# Patient Record
Sex: Female | Born: 1945
Health system: Southern US, Community
[De-identification: ages and names within clinical notes are randomized; demographics above are authoritative.]

## PROBLEM LIST (undated history)

## (undated) DIAGNOSIS — I10 Essential (primary) hypertension: Secondary | ICD-10-CM

## (undated) DIAGNOSIS — E785 Hyperlipidemia, unspecified: Secondary | ICD-10-CM

## (undated) DIAGNOSIS — I2510 Atherosclerotic heart disease of native coronary artery without angina pectoris: Secondary | ICD-10-CM

## (undated) DIAGNOSIS — Z720 Tobacco use: Secondary | ICD-10-CM

## (undated) DIAGNOSIS — C539 Malignant neoplasm of cervix uteri, unspecified: Secondary | ICD-10-CM

## (undated) HISTORY — DX: Atherosclerotic heart disease of native coronary artery without angina pectoris: I25.10

## (undated) HISTORY — PX: CORONARY ARTERY BYPASS GRAFT: SHX141

## (undated) HISTORY — DX: Tobacco use: Z72.0

## (undated) HISTORY — DX: Hyperlipidemia, unspecified: E78.5

## (undated) HISTORY — DX: Malignant neoplasm of cervix uteri, unspecified: C53.9

## (undated) HISTORY — DX: Essential (primary) hypertension: I10

---

## 1998-06-02 ENCOUNTER — Ambulatory Visit (HOSPITAL_COMMUNITY): Admission: RE | Admit: 1998-06-02 | Discharge: 1998-06-02 | Payer: Self-pay | Admitting: Obstetrics and Gynecology

## 1998-06-02 ENCOUNTER — Encounter: Payer: Self-pay | Admitting: Obstetrics and Gynecology

## 1999-09-30 ENCOUNTER — Encounter: Admission: RE | Admit: 1999-09-30 | Discharge: 1999-09-30 | Payer: Self-pay | Admitting: Obstetrics and Gynecology

## 1999-09-30 ENCOUNTER — Encounter: Payer: Self-pay | Admitting: Obstetrics and Gynecology

## 2000-08-01 ENCOUNTER — Encounter: Admission: RE | Admit: 2000-08-01 | Discharge: 2000-08-01 | Payer: Self-pay | Admitting: Family Medicine

## 2000-08-01 ENCOUNTER — Encounter: Payer: Self-pay | Admitting: Family Medicine

## 2000-08-24 ENCOUNTER — Other Ambulatory Visit: Admission: RE | Admit: 2000-08-24 | Discharge: 2000-08-24 | Payer: Self-pay | Admitting: Obstetrics and Gynecology

## 2000-10-24 ENCOUNTER — Encounter: Admission: RE | Admit: 2000-10-24 | Discharge: 2000-10-24 | Payer: Self-pay | Admitting: Obstetrics and Gynecology

## 2000-10-24 ENCOUNTER — Encounter: Payer: Self-pay | Admitting: Obstetrics and Gynecology

## 2001-04-17 ENCOUNTER — Other Ambulatory Visit: Admission: RE | Admit: 2001-04-17 | Discharge: 2001-04-17 | Payer: Self-pay | Admitting: Obstetrics and Gynecology

## 2001-12-27 ENCOUNTER — Encounter: Payer: Self-pay | Admitting: Obstetrics and Gynecology

## 2001-12-27 ENCOUNTER — Encounter: Admission: RE | Admit: 2001-12-27 | Discharge: 2001-12-27 | Payer: Self-pay | Admitting: Obstetrics and Gynecology

## 2003-07-17 ENCOUNTER — Encounter: Admission: RE | Admit: 2003-07-17 | Discharge: 2003-07-17 | Payer: Self-pay | Admitting: Obstetrics and Gynecology

## 2003-09-09 ENCOUNTER — Inpatient Hospital Stay (HOSPITAL_COMMUNITY): Admission: AD | Admit: 2003-09-09 | Discharge: 2003-09-15 | Payer: Self-pay | Admitting: Cardiovascular Disease

## 2003-09-29 ENCOUNTER — Ambulatory Visit (HOSPITAL_COMMUNITY)
Admission: RE | Admit: 2003-09-29 | Discharge: 2003-09-29 | Payer: Self-pay | Admitting: Thoracic Surgery (Cardiothoracic Vascular Surgery)

## 2004-01-15 ENCOUNTER — Ambulatory Visit: Payer: Self-pay

## 2004-02-17 ENCOUNTER — Ambulatory Visit: Payer: Self-pay | Admitting: Cardiology

## 2004-07-19 ENCOUNTER — Encounter: Admission: RE | Admit: 2004-07-19 | Discharge: 2004-07-19 | Payer: Self-pay | Admitting: Obstetrics and Gynecology

## 2004-08-16 ENCOUNTER — Ambulatory Visit: Payer: Self-pay | Admitting: Cardiology

## 2004-08-25 ENCOUNTER — Ambulatory Visit: Payer: Self-pay

## 2005-05-16 IMAGING — CR DG CHEST 1V PORT
1 series · 1 of 1 positions shown · non-contrast
Comparison: none

CLINICAL DATA: Status post CABG.
 PORTABLE SINGLE VIEW CHEST, 09/12/03, [DATE] HOURS
 Comparison 09/11/03.
 The patient has been extubated.  No pneumothorax.  Stable positioning of Swan-Ganz catheter.  No edema or pleural effusions.  Heart size stable. 
 IMPRESSION 
 No pneumothorax or other acute findings.

[view not recorded]
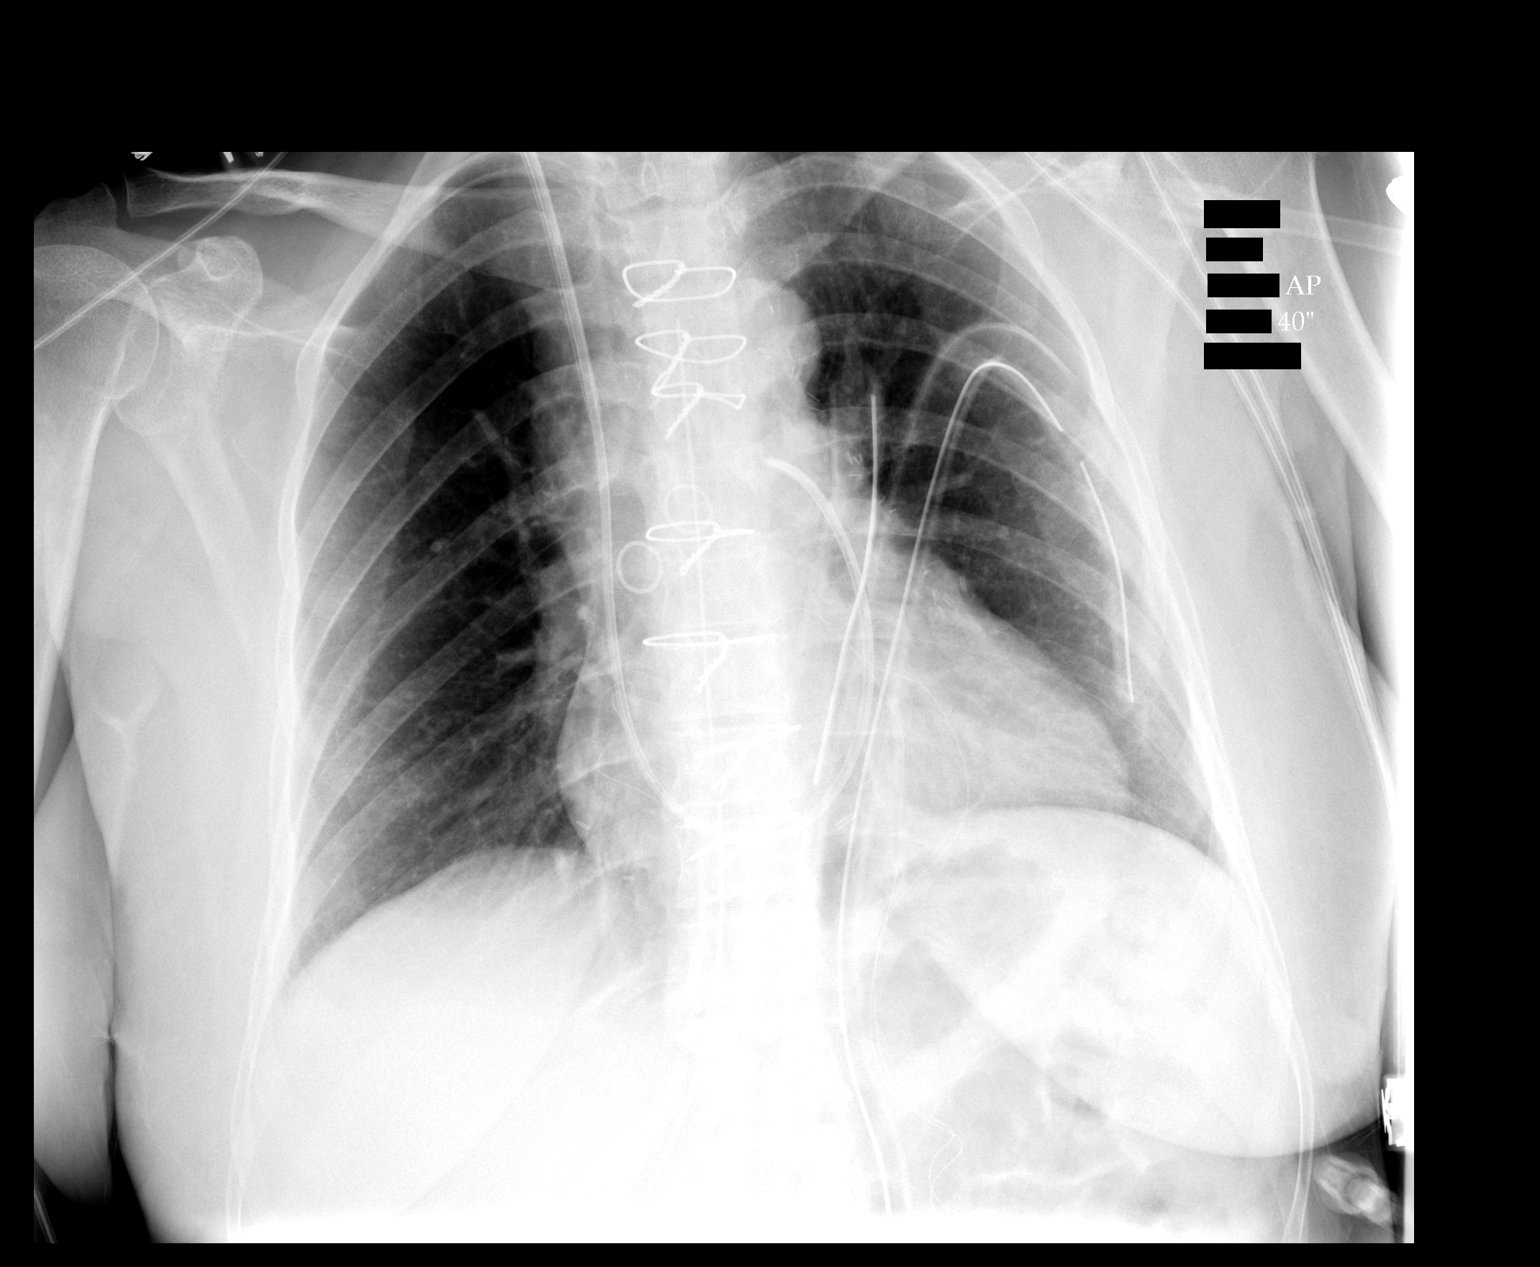

[1 of 1 positions shown; findings below may reference images not displayed]

## 2005-09-15 ENCOUNTER — Encounter: Admission: RE | Admit: 2005-09-15 | Discharge: 2005-09-15 | Payer: Self-pay | Admitting: Obstetrics and Gynecology

## 2005-11-24 ENCOUNTER — Ambulatory Visit: Payer: Self-pay | Admitting: Cardiology

## 2006-10-03 ENCOUNTER — Encounter: Admission: RE | Admit: 2006-10-03 | Discharge: 2006-10-03 | Payer: Self-pay | Admitting: Family Medicine

## 2006-11-15 ENCOUNTER — Ambulatory Visit: Payer: Self-pay | Admitting: Cardiology

## 2006-11-23 ENCOUNTER — Ambulatory Visit: Payer: Self-pay

## 2006-11-23 LAB — CONVERTED CEMR LAB
AST: 35 units/L (ref 0–37)
Alkaline Phosphatase: 84 units/L (ref 39–117)
Bilirubin, Direct: 0.2 mg/dL (ref 0.0–0.3)
CO2: 32 meq/L (ref 19–32)
Calcium: 9.5 mg/dL (ref 8.4–10.5)
Chloride: 101 meq/L (ref 96–112)
GFR calc non Af Amer: 78 mL/min
Glucose, Bld: 96 mg/dL (ref 70–99)
Potassium: 4.2 meq/L (ref 3.5–5.1)
Sodium: 139 meq/L (ref 135–145)
Total Bilirubin: 1.2 mg/dL (ref 0.3–1.2)
Total CHOL/HDL Ratio: 2.2
Total Protein: 6.6 g/dL (ref 6.0–8.3)
Triglycerides: 97 mg/dL (ref 0–149)

## 2007-10-04 ENCOUNTER — Encounter: Admission: RE | Admit: 2007-10-04 | Discharge: 2007-10-04 | Payer: Self-pay | Admitting: Obstetrics and Gynecology

## 2008-03-17 ENCOUNTER — Ambulatory Visit: Payer: Self-pay | Admitting: Internal Medicine

## 2008-03-17 DIAGNOSIS — Z8541 Personal history of malignant neoplasm of cervix uteri: Secondary | ICD-10-CM | POA: Insufficient documentation

## 2008-03-17 DIAGNOSIS — I2510 Atherosclerotic heart disease of native coronary artery without angina pectoris: Secondary | ICD-10-CM

## 2008-03-17 DIAGNOSIS — E785 Hyperlipidemia, unspecified: Secondary | ICD-10-CM | POA: Insufficient documentation

## 2008-04-28 ENCOUNTER — Telehealth: Payer: Self-pay | Admitting: Internal Medicine

## 2008-09-22 ENCOUNTER — Encounter: Payer: Self-pay | Admitting: Internal Medicine

## 2008-10-06 ENCOUNTER — Encounter: Payer: Self-pay | Admitting: Internal Medicine

## 2008-10-06 ENCOUNTER — Encounter: Admission: RE | Admit: 2008-10-06 | Discharge: 2008-10-06 | Payer: Self-pay | Admitting: Obstetrics and Gynecology

## 2009-02-13 ENCOUNTER — Ambulatory Visit: Payer: Self-pay | Admitting: Internal Medicine

## 2009-02-13 LAB — CONVERTED CEMR LAB
AST: 27 units/L (ref 0–37)
Alkaline Phosphatase: 68 units/L (ref 39–117)
BUN: 8 mg/dL (ref 6–23)
Basophils Relative: 1 % (ref 0.0–3.0)
Bilirubin, Direct: 0.2 mg/dL (ref 0.0–0.3)
CO2: 31 meq/L (ref 19–32)
GFR calc non Af Amer: 89.7 mL/min (ref >60)
Glucose, Bld: 85 mg/dL (ref 70–99)
HDL: 73.7 mg/dL (ref >39.00)
LDL Cholesterol: 61 mg/dL (ref 0–99)
Lymphocytes Relative: 18.3 % (ref 12.0–46.0)
Lymphs Abs: 1.3 10*3/uL (ref 0.7–4.0)
Neutrophils Relative %: 71 % (ref 43.0–77.0)
Platelets: 144 10*3/uL — ABNORMAL LOW (ref 150.0–400.0)
RDW: 11.8 % (ref 11.5–14.6)
Sodium: 143 meq/L (ref 135–145)
TSH: 1.66 microintl units/mL (ref 0.35–5.50)
Total CHOL/HDL Ratio: 2
Total CK: 90 units/L (ref 7–177)
VLDL: 10 mg/dL (ref 0.0–40.0)

## 2009-02-23 ENCOUNTER — Telehealth (INDEPENDENT_AMBULATORY_CARE_PROVIDER_SITE_OTHER): Payer: Self-pay | Admitting: *Deleted

## 2009-04-28 ENCOUNTER — Telehealth: Payer: Self-pay | Admitting: Internal Medicine

## 2009-05-05 ENCOUNTER — Encounter (INDEPENDENT_AMBULATORY_CARE_PROVIDER_SITE_OTHER): Payer: Self-pay | Admitting: *Deleted

## 2009-05-07 ENCOUNTER — Ambulatory Visit: Payer: Self-pay | Admitting: Internal Medicine

## 2009-05-21 ENCOUNTER — Ambulatory Visit: Payer: Self-pay | Admitting: Internal Medicine

## 2009-05-21 LAB — HM COLONOSCOPY

## 2009-09-30 LAB — HM PAP SMEAR: HM Pap smear: NORMAL

## 2009-11-19 ENCOUNTER — Encounter: Admission: RE | Admit: 2009-11-19 | Discharge: 2009-11-19 | Payer: Self-pay | Admitting: Obstetrics and Gynecology

## 2009-11-30 ENCOUNTER — Ambulatory Visit: Payer: Self-pay | Admitting: Internal Medicine

## 2009-11-30 DIAGNOSIS — L02519 Cutaneous abscess of unspecified hand: Secondary | ICD-10-CM | POA: Insufficient documentation

## 2009-11-30 DIAGNOSIS — L03019 Cellulitis of unspecified finger: Secondary | ICD-10-CM

## 2009-12-01 ENCOUNTER — Ambulatory Visit: Payer: Self-pay | Admitting: Internal Medicine

## 2010-04-04 ENCOUNTER — Encounter: Payer: Self-pay | Admitting: Thoracic Surgery (Cardiothoracic Vascular Surgery)

## 2010-04-04 ENCOUNTER — Encounter: Payer: Self-pay | Admitting: Obstetrics and Gynecology

## 2010-04-13 NOTE — Miscellaneous (Signed)
Summary: [facility] Previsit/prep  Clinical Lists Changes  Medications: Added new medication of MOVIPREP 100 GM  SOLR (PEG-KCL-NACL-NASULF-NA ASC-C) As per prep instructions. - Signed Rx of MOVIPREP 100 GM  SOLR (PEG-KCL-NACL-NASULF-NA ASC-C) As per prep instructions.;  #1 x 0;  Signed;  Entered by: Wyona Almas RN;  Authorized by: Hilarie Fredrickson MD;  Method used: Electronically to [facility]*, [address], [address] Shipping/[address], [place], [place]  [address], Ph: [phone], Fax: [phone] Observations: Added new observation of ALLERGY REV: Done (05/07/2009 15:20) Added new observation of NKA: T (05/07/2009 15:20)    Prescriptions: MOVIPREP 100 GM  SOLR (PEG-KCL-NACL-NASULF-NA ASC-C) As per prep instructions.  #1 x 0   Entered by:   Wyona Almas RN   Authorized by:   Hilarie Fredrickson MD   Signed by:   Wyona Almas RN on 05/07/2009   Method used:   Electronically to        [facility]* (retail)       [address]       [address] Shipping/mailing       [place], [place]  [address]       Ph: [phone]       Fax: [phone]   RxID:   [id number]

## 2010-04-13 NOTE — Progress Notes (Signed)
Summary: refill request  Phone Note Refill Request Message from:  Fax from Pharmacy on April 28, 2009 9:17 AM  Refills Requested: Medication #1:  VYTORIN 10-80 MG TABS Take 1 tablet by mouth once a day   Dosage confirmed as above?Dosage Confirmed   Supply Requested: 3 months   Last Refilled: 04/28/2008 Initial call taken by: Rock Nephew CMA,  April 28, 2009 9:18 AM    Prescriptions: VYTORIN 10-80 MG TABS (EZETIMIBE-SIMVASTATIN) Take 1 tablet by mouth once a day  #90 x 3   Entered by:   Rock Nephew CMA   Authorized by:   Etta Grandchild MD   Signed by:   Rock Nephew CMA on 04/28/2009   Method used:   Electronically to        [facility]* (retail)       [address]       [address] Shipping/mailing       [place], [place]  [address]       Ph: [phone]       Fax: [phone]   RxID:   [id number]

## 2010-04-13 NOTE — Procedures (Signed)
Summary: Colonoscopy  Patient: [name] Note: All result statuses are Final unless otherwise noted.  Tests: (1) Colonoscopy (COL)   COL Colonoscopy           DONE     [facility]     [address]     [place], [place]  [address]           COLONOSCOPY PROCEDURE REPORT           PATIENT:  Karen, Hughes  MR#:  [id number]     BIRTHDATE:  08-16-1945, 63 yrs. old  GENDER:  female           ENDOSCOPIST:  Wilhemina Bonito. Eda Keys, MD     Referred by:  Etta Grandchild, M.D.           PROCEDURE DATE:  05/21/2009     PROCEDURE:  Average-risk screening colonoscopy     G0121     ASA CLASS:  Class II     INDICATIONS:  Routine Risk Screening           MEDICATIONS:   Fentanyl 25 mcg IV, Versed 5 mg IV           DESCRIPTION OF PROCEDURE:   After the risks benefits and     alternatives of the procedure were thoroughly explained, informed     consent was obtained.  Digital rectal exam was performed and     revealed no abnormalities.   The LB CF-[id number] endoscope     was introduced through the anus and advanced to the cecum, which     was identified by both the appendix and ileocecal valve, without     limitations. Time to cecum = 3:31 min.The quality of the prep was     good, using MoviPrep.  The instrument was then slowly withdrawn     (time = 14:16 min) as the colon was fully examined.     <<PROCEDUREIMAGES>>           FINDINGS:  Mild diverticulosis was found in the sigmoid colon.  A     lipoma was found in the ascending colon.  Melanosis coli was found     throughout the colon.  No polyps or cancers were seen.     Retroflexed views in the rectum revealed no abnormalities.    The     scope was then withdrawn from the patient and the procedure     completed.           COMPLICATIONS:  None           ENDOSCOPIC IMPRESSION:     1) Mild diverticulosis in the sigmoid colon     2) Lipomas in the ascending colon     3) Melanosis throughout the colon     4) No polyps or  cancers     RECOMMENDATIONS:     1) Continue current colorectal screening recommendations for     "routine risk" patients with a repeat colonoscopy in 10 years.           ______________________________     Wilhemina Bonito. Eda Keys, MD           CC:  Etta Grandchild, MD;The Patient           n.     Rosalie DoctorWilhemina Bonito. Eda Keys at 05/21/2009 10:42 AM           Drusilla Kanner, [phone]  Note: An exclamation mark Marland Kitchen)  indicates a result that was not dispersed into the flowsheet. Document Creation Date: 05/21/2009 10:42 AM _______________________________________________________________________  (1) Order result status: Final Collection or observation date-time: 05/21/2009 10:33 Requested date-time:  Receipt date-time:  Reported date-time:  Referring Physician:   Ordering Physician: Fransico Setters [phone]) Specimen Source:  Source: Launa Grill Order Number: [id number] Lab site:   Appended Document: Colonoscopy    Clinical Lists Changes  Observations: Added new observation of COLONNXTDUE: 05/2019 (05/21/2009 11:11)

## 2010-04-13 NOTE — Letter (Signed)
Summary: [facility] Instructions  [facility]  [address] [place], [place] [address]   Phone: [phone]  Fax: [phone]       [name]    04/13/1963    MRN: [id number]        Procedure Day /Date:  Thursday  05/21/09     Arrival Time:  8:00am     Procedure Time:  9:00am    Location of Procedure:                    [facility] _  [facility] (4th Floor)                        PREPARATION FOR COLONOSCOPY WITH MOVIPREP   Starting 5 days prior to your procedure   Saturday 03/05  do not eat nuts, seeds, popcorn, corn, beans, peas,  salads, or any raw vegetables.  Do not take any fiber supplements (e.g. Metamucil, Citrucel, and Benefiber).  THE DAY BEFORE YOUR PROCEDURE         DATE:  03/09   DAY:  Wednesday  1.  Drink clear liquids the entire day-NO SOLID FOOD  2.  Do not drink anything colored red or purple.  Avoid juices with pulp.  No orange juice.  3.  Drink at least 64 oz. (8 glasses) of fluid/clear liquids during the day to prevent dehydration and help the prep work efficiently.  CLEAR LIQUIDS INCLUDE: Water Jello Ice Popsicles Tea (sugar ok, no milk/cream) Powdered fruit flavored drinks Coffee (sugar ok, no milk/cream) Gatorade Juice: apple, white grape, white cranberry  Lemonade Clear bullion, consomm, broth Carbonated beverages (any kind) Strained chicken noodle soup Hard Candy                             4.  In the morning, mix first dose of MoviPrep solution:    Empty 1 Pouch A and 1 Pouch B into the disposable container    Add lukewarm drinking water to the top line of the container. Mix to dissolve    Refrigerate (mixed solution should be used within 24 hrs)  5.  Begin drinking the prep at 5:00 p.m. The MoviPrep container is divided by 4 marks.   Every 15 minutes drink the solution down to the next mark (approximately 8 oz) until the full liter is complete.   6.  Follow completed prep with 16 oz of clear liquid of your  choice (Nothing red or purple).  Continue to drink clear liquids until bedtime.  7.  Before going to bed, mix second dose of MoviPrep solution:    Empty 1 Pouch A and 1 Pouch B into the disposable container    Add lukewarm drinking water to the top line of the container. Mix to dissolve    Refrigerate  THE DAY OF YOUR PROCEDURE      DATE:  03/10  DAY:  Thursday  Beginning at  4:00 a.m. (5 hours before procedure):         1. Every 15 minutes, drink the solution down to the next mark (approx 8 oz) until the full liter is complete.  2. Follow completed prep with 16 oz. of clear liquid of your choice.    3. You may drink clear liquids until 7:00am  (2 HOURS BEFORE PROCEDURE).   MEDICATION INSTRUCTIONS  Unless otherwise instructed, you should take regular prescription medications with a small  sip of water   as early as possible the morning of your procedure.         OTHER INSTRUCTIONS  You will need a responsible adult at least 65 years of age to accompany you and drive you home.   This person must remain in the waiting room during your procedure.  Wear loose fitting clothing that is easily removed.  Leave jewelry and other valuables at home.  However, you may wish to bring a book to read or  an iPod/MP3 player to listen to music as you wait for your procedure to start.  Remove all body piercing jewelry and leave at home.  Total time from sign-in until discharge is approximately 2-3 hours.  You should go home directly after your procedure and rest.  You can resume normal activities the  day after your procedure.  The day of your procedure you should not:   Drive   Make legal decisions   Operate machinery   Drink alcohol   Return to work  You will receive specific instructions about eating, activities and medications before you leave.    The above instructions have been reviewed and explained to me by  Wyona Almas RN  May 07, 2009 3:50 PM     I  fully understand and can verbalize these instructions _____________________________ Date _________

## 2010-04-13 NOTE — Assessment & Plan Note (Signed)
Summary: 1 DY PACKING REMOVAL APPT AND RECHECK--STC   Vital Signs:  Patient profile:   65 year old female Height:      66 inches Weight:      135 pounds O2 Sat:      97 % on Room air Temp:     97.8 degrees F oral Pulse rate:   57 / minute Pulse rhythm:   regular Resp:     16 per minute BP sitting:   100 / 56  (left arm) Cuff size:   regular  Vitals Entered By: Lamar Sprinkles, CMA (December 01, 2009 9:37 AM)  O2 Flow:  Room air CC: 1 day f/u   Primary Care Provider:  Etta Grandchild MD  CC:  1 day f/u.  History of Present Illness: She returns for f/up and says that her left ring finger is doing better with no pain or swelling. The specimen was positive for [facility] but no organisms seen or cultured thus far. She is tolerating the antibiotics well.  Preventive Screening-Counseling & Management  Alcohol-Tobacco     Alcohol drinks/day: 0     Smoking Status: quit     Year Quit: 1987     Pack years: 20     Passive Smoke Exposure: no     Tobacco Counseling: to remain off tobacco products  Current Medications (verified): 1)  Vytorin 10-80 Mg Tabs (Ezetimibe-Simvastatin) .... Take 1 Tablet By Mouth Once A Day 2)  Metoprolol Tartrate 25 Mg Tabs (Metoprolol Tartrate) .... 1/2 Two Times A Day 3)  Asa 325mg  .... Take 1 Tablet By Mouth Once A Day 4)  Sulfamethoxazole-Tmp Ds 800-160 Mg Tab (Trimethoprim-Sulfamethoxazole) .... Take 1 Tablet By Mouth Morning and Night 5)  Cefuroxime Axetil 500 Mg Tabs (Cefuroxime Axetil) .... One By Mouth Two Times A Day For 10 Days  Allergies (verified): No Known Drug Allergies  Past History:  Past Medical History: Last updated: 03/17/2008 Cervical cancer, hx of - Dr. 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 Family History of CAD Female 1st degree relative <50 Family History Diabetes 1st degree relative Family History Hypertension  Social History: Reviewed history from 03/17/2008 and no changes required. 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  Physical Exam  General:  alert, well-developed, well-nourished, and well-hydrated.   Neck:  supple, full ROM, no masses, no thyromegaly, no thyroid nodules or tenderness, no JVD, no cervical lymphadenopathy, and no neck tenderness.   Lungs:  normal respiratory effort, no intercostal retractions, no accessory muscle use, normal breath sounds, no dullness, and no fremitus.   Heart:  normal rate, regular rhythm, no murmur, no gallop, no rub, and no JVD.   Abdomen:  soft, non-tender, normal bowel sounds, no distention, no masses, no guarding, no rigidity, no rebound tenderness, no abdominal hernia, no hepatomegaly, and no splenomegaly.   Skin:  on the  dorsum of the left ring finger there is a 2 mm opening over the PIP joint and with no additional exudate noted and no erythema/ttp/swelling/warmth/induration/crepitance. distally there is good capillary refill and sensation. there is no involvement of the sides or the volar surface and no evidence of compartment syndrome. Cervical Nodes:  no anterior cervical adenopathy and no posterior cervical adenopathy.   Axillary  Nodes:  no R axillary adenopathy and no L axillary adenopathy.   Psych:  Cognition and judgment appear intact. Alert and cooperative with normal attention span and concentration. No apparent delusions, illusions, hallucinations Additional Exam:  the packing was removed and there was no additional exudate. neosporin and a bandaid were applied.   Impression & Recommendations:  Problem # 1:  CELLULITIS/ABSCESS, FINGER NOS (ICD-681.00) Assessment Improved  Her updated medication list for this problem includes:    Sulfamethoxazole-tmp Ds 800-160 Mg Tab (Trimethoprim-sulfamethoxazole) .Marland Kitchen... Take 1 tablet by mouth morning and night    Cefuroxime Axetil 500 Mg Tabs (Cefuroxime axetil) ..... One by mouth two times a day for 10 days  Elevate affected area. Warm moist compresses for 20 minutes every 2 hours while awake. Take antibiotics as directed and take acetaminophen as needed. To be seen in 48-72 hours if no improvement, sooner if worse.  Complete Medication List: 1)  Vytorin 10-80 Mg Tabs (Ezetimibe-simvastatin) .... Take 1 tablet by mouth once a day 2)  Metoprolol Tartrate 25 Mg Tabs (Metoprolol tartrate) .... 1/2 two times a day 3)  Asa 325mg   .... Take 1 tablet by mouth once a day 4)  Sulfamethoxazole-tmp Ds 800-160 Mg Tab (Trimethoprim-sulfamethoxazole) .... Take 1 tablet by mouth morning and night 5)  Cefuroxime Axetil 500 Mg Tabs (Cefuroxime axetil) .... One by mouth two times a day for 10 days  Patient Instructions: 1)  Please schedule a follow-up appointment in 1-2 weeks. 2)  Take your antibiotic as prescribed until ALL of it is gone, but stop if you develop a rash or swelling and contact our office as soon as possible.   Tetanus/Td Immunization History:    Tetanus/Td # 1:  Tdap (11/30/2009)

## 2010-04-13 NOTE — Assessment & Plan Note (Signed)
Summary: RED SWOLLEN L RING FINGER--STC   Vital Signs:  Patient profile:   65 year old female Height:      66 inches Weight:      134 pounds BMI:     21.71 O2 Sat:      96 % on Room air Temp:     97.8 degrees F oral Pulse rate:   67 / minute Pulse rhythm:   regular Resp:     16 per minute BP sitting:   122 / 70  (left arm) Cuff size:   large  Vitals Entered By: Bill Salinas CMA (November 30, 2009 9:11 AM)  O2 Flow:  Room air CC: pt here for evaluation of knot on left ring finger x 1 week/ ab   Primary Care Provider:  Etta Grandchild MD  CC:  pt here for evaluation of knot on left ring finger x 1 week/ ab.  History of Present Illness: She returns c/o a problem with her left ring finger. She thinks the area got scratched on the dorsum of the PIP joint and then became red and swollen so she tried to pop it with a needle but only a small amount of pus came out and the pain/redness/swelling have worsened.  Current Medications (verified): 1)  Vytorin 10-80 Mg Tabs (Ezetimibe-Simvastatin) .... Take 1 Tablet By Mouth Once A Day 2)  Metoprolol Tartrate 25 Mg Tabs (Metoprolol Tartrate) .... 1/2 Two Times A Day 3)  Asa 325mg  .... Take 1 Tablet By Mouth Once A Day  Allergies (verified): No Known Drug Allergies  Past History:  Past Medical History: Last updated: 03/17/2008 Cervical cancer, hx of - Dr. 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 Family History of CAD Female 1st degree relative <50 Family History Diabetes 1st degree relative Family History Hypertension  Social History: Reviewed history from 03/17/2008 and no changes required. 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    Physical Exam  General:  alert, well-developed, well-nourished, and well-hydrated.   Lungs:  normal respiratory effort, no intercostal retractions, no accessory muscle use, normal breath sounds, no dullness, and no fremitus.   Heart:  normal rate, regular rhythm, no murmur, no gallop, no rub, and no JVD.   Abdomen:  soft, non-tender, normal bowel sounds, no distention, no masses, no guarding, no rigidity, no rebound tenderness, no abdominal hernia, no hepatomegaly, and no splenomegaly.   Msk:  normal ROM, no joint tenderness, no joint swelling, no joint warmth, no redness over joints, no joint deformities, no joint instability, and no crepitation.   Skin:  on the dorsum of the left ring finger there is a tiny healed abrasion over the PIP joint and around this there is an area of erythema/warmth/swelling/fluctuance but no streaking. distally there is good capillary refill and sensation. there is no  involvement of the sides or the volar surface. Cervical Nodes:  no anterior cervical adenopathy and no posterior cervical adenopathy.   Axillary Nodes:  no R axillary adenopathy and no L axillary adenopathy.   Inguinal Nodes:  no R inguinal adenopathy and no L inguinal adenopathy.   Psych:  Cognition and judgment appear intact. Alert and cooperative with normal attention span and concentration. No apparent  delusions, illusions, hallucinations Additional Exam:  An I and D was preformed. the area was cleaned with betadine and prepped and draped in sterile fashion, local anesthesia was obtained with 1.5 cc of 2% plain lidocaine. a 3 mm punch was used to open the area and a small pocket of thick purulent exudate was seen and cultured, no deep space infection was seen, the opening was packed with iodoform, there was no blood loss and she tolerated it well. a dressing and finger splint were applied. after the procedure she still has good sensation and capillary refill at the tip of the finger.   Impression & Recommendations:  Problem # 1:  CELLULITIS/ABSCESS, FINGER NOS (ICD-681.00) Assessment New will cover MRSA and strep Her updated medication list for this problem includes:    Sulfamethoxazole-tmp Ds 800-160 Mg Tab (Trimethoprim-sulfamethoxazole) .Marland Kitchen... Take 1 tablet by mouth morning and night    Cefuroxime Axetil 500 Mg Tabs (Cefuroxime axetil) ..... One by mouth two times a day for 10 days  Orders: T-Culture, Wound ([id number])  Complete Medication List: 1)  Vytorin 10-80 Mg Tabs (Ezetimibe-simvastatin) .... Take 1 tablet by mouth once a day 2)  Metoprolol Tartrate 25 Mg Tabs (Metoprolol tartrate) .... 1/2 two times a day 3)  Asa 325mg   .... Take 1 tablet by mouth once a day 4)  Sulfamethoxazole-tmp Ds 800-160 Mg Tab (Trimethoprim-sulfamethoxazole) .... Take 1 tablet by mouth morning and night 5)  Cefuroxime Axetil 500 Mg Tabs (Cefuroxime axetil) .... One by mouth two times a day for 10 days  Patient Instructions: 1)  Please schedule a follow-up appointment in 1 day for packing removal and recheck. 2)  Take your antibiotic as prescribed until ALL of it is gone, but stop if you develop a rash or swelling and contact our office as soon as possible. Prescriptions: CEFUROXIME AXETIL 500 MG TABS (CEFUROXIME AXETIL) One by mouth two times a day for 10 days  #20 x 1   Entered and Authorized  by:   Etta Grandchild MD   Signed by:   Etta Grandchild MD on 11/30/2009   Method used:   Electronically to        [facility] [address] [phone]* (retail)       [address]       [place], [place]  [address]       Ph: [phone] or [phone]       Fax: [phone]   RxID:   [id number] SULFAMETHOXAZOLE-TMP DS 800-160 MG TAB (TRIMETHOPRIM-SULFAMETHOXAZOLE) Take 1 tablet by mouth morning and night  #20 x 1   Entered and Authorized by:   Etta Grandchild MD   Signed by:   Etta Grandchild MD on 11/30/2009   Method used:   [facility]        [facility] [address] [phone][facility] (retail)       [address]       [place], [place]  [address]       Ph: [phone] or  [phone]       Fax: [phone]   RxID:   [id number]   Appended Document: RED SWOLLEN L RING FINGER--STC     Clinical Lists Changes  Orders: Added new Service order of Tdap => 62yrs IM (41324) - Signed Added new Service order of Admin 1st Vaccine (40102) - Signed Observations: Added new observation of TD BOOST VIS: 01/30/08 version given December 01, 2009. (12/01/2009 9:38) Added new observation of TD BOOSTERLO: [id number] (12/01/2009 9:38) Added new observation of TD BOOST EXP: 01/01/2012 (12/01/2009 9:38) Added new observation of TD BOOSTERBY: Ami Bullins CMA (12/01/2009 9:38) Added new observation of TD BOOSTERRT: IM (12/01/2009 9:38) Added new observation of TDBOOSTERDSE: 0.5 ml (12/01/2009 9:38) Added new observation of TD BOOSTERMF: Gl[employer]oSmithK[employer] (12/01/2009 9:38) Added new observation of TD BOOST SIT: right deltoid (12/01/2009 9:38) Added new observation of TD BOOSTER: Tdap (12/01/2009 9:38)       Immunizations Administered:  Tetanus Vaccine:    Vaccine Type: Tdap    Site: right deltoid    Mfr: GlaxoSmithKline    Dose: 0.5 ml    Route: IM    Given by: Ami Bullins CMA    Exp. Date: 01/01/2012    Lot #: [id number]    VIS given: 01/30/08  version given December 01, 2009.

## 2010-05-19 ENCOUNTER — Telehealth: Payer: Self-pay | Admitting: Internal Medicine

## 2010-05-20 ENCOUNTER — Telehealth: Payer: Self-pay | Admitting: Internal Medicine

## 2010-05-25 NOTE — Progress Notes (Signed)
Summary: Med inquiry  Phone Note Call from Patient   Caller: Patient [phone] ([phone]) Summary of Call: Pt requesting clarification on dosage difference on cholesterol medication change..Pt states that she was taking Vytorin 80mg  and has been switched to Lipitor 40mg . Pt would like to know if new med dosage is sufficient.? Initial call taken by: Burnard Leigh [facility]),  May 20, 2010 10:50 AM  Follow-up for Phone Call        should be ok, we can change FLP in 1-2 months and see Follow-up by: Etta Grandchild MD,  May 20, 2010 10:56 AM  Additional Follow-up for Phone Call Additional follow up Details #1::        Before calling patient--Will you want Pt to have office visit before FLP labs or are you wanting to check FLP labs only in 1-2 mths.? Thanks! Additional Follow-up by: Burnard Leigh [facility]),  May 21, 2010 9:33 AM    Additional Follow-up for Phone Call Additional follow up Details #2::    ov would be best Follow-up by: Etta Grandchild MD,  May 21, 2010 9:53 AM  Additional Follow-up for Phone Call Additional follow up Details #3:: Details for Additional Follow-up Action Taken: informed Pt and transferred to scheduling for OV appt. Additional Follow-up by: Burnard Leigh [facility]),  May 21, 2010 3:21 PM

## 2010-05-25 NOTE — Progress Notes (Signed)
Summary: Vytorin PA needed or alt med  Phone Note Call from Patient Call back at Work Phone [phone]   Summary of Call: pt called stating Vytorin needs PA. I called [facility] outpatient pharmacy and req PA number to initiate PA. Initial call taken by: Lanier Prude, [facility]),  May 19, 2010 8:46 AM  Follow-up for Phone Call        Called [phone]. Per Revonda Standard at [employer] pt should try alternatives. Vytorin is a non formulary and is not covered unless it is medically necessary and/or she tries/fails generic alt. Please advise on gen alt. Follow-up by: Lanier Prude, [facility]),  May 19, 2010 11:45 AM  Additional Follow-up for Phone Call Additional follow up Details #1::        pt informed new med sent to pharm. Additional Follow-up by: Lanier Prude, [facility]),  May 19, 2010 11:54 AM    New/Updated Medications: ATORVASTATIN CALCIUM 40 MG TABS (ATORVASTATIN CALCIUM) One by mouth once daily Prescriptions: ATORVASTATIN CALCIUM 40 MG TABS (ATORVASTATIN CALCIUM) One by mouth once daily  #90 x 3   Entered and Authorized by:   Etta Grandchild MD   Signed by:   Etta Grandchild MD on 05/19/2010   Method used:   Electronically to        [facility]* (retail)       [address]       [address] Shipping/mailing       [place], [place]  [address]       Ph: [phone]       Fax: [phone]   RxID:   [id number]

## 2010-06-22 ENCOUNTER — Ambulatory Visit: Payer: Self-pay | Admitting: Internal Medicine

## 2010-07-05 ENCOUNTER — Ambulatory Visit (INDEPENDENT_AMBULATORY_CARE_PROVIDER_SITE_OTHER): Payer: 59 | Admitting: Internal Medicine

## 2010-07-05 ENCOUNTER — Other Ambulatory Visit (INDEPENDENT_AMBULATORY_CARE_PROVIDER_SITE_OTHER): Payer: 59

## 2010-07-05 ENCOUNTER — Encounter: Payer: Self-pay | Admitting: Internal Medicine

## 2010-07-05 VITALS — BP 124/70 | HR 70 | Temp 97.5°F | Resp 16 | Wt 137.0 lb

## 2010-07-05 DIAGNOSIS — E785 Hyperlipidemia, unspecified: Secondary | ICD-10-CM

## 2010-07-05 LAB — LIPID PANEL
Cholesterol: 158 mg/dL (ref 0–200)
HDL: 76.2 mg/dL (ref >39.00)
Triglycerides: 62 mg/dL (ref 0.0–149.0)

## 2010-07-05 LAB — CBC WITH DIFFERENTIAL/PLATELET
Basophils Absolute: 0.1 10*3/uL (ref 0.0–0.1)
Basophils Relative: 0.9 % (ref 0.0–3.0)
Eosinophils Absolute: 0.2 10*3/uL (ref 0.0–0.7)
Eosinophils Relative: 4 % (ref 0.0–5.0)
Hemoglobin: 13.9 g/dL (ref 12.0–15.0)
Monocytes Absolute: 0.3 10*3/uL (ref 0.1–1.0)
Monocytes Relative: 5.2 % (ref 3.0–12.0)
Neutrophils Relative %: 71.6 % (ref 43.0–77.0)
RBC: 4.19 Mil/uL (ref 3.87–5.11)
RDW: 13.1 % (ref 11.5–14.6)
WBC: 6.2 10*3/uL (ref 4.5–10.5)

## 2010-07-05 LAB — COMPREHENSIVE METABOLIC PANEL
AST: 23 U/L (ref 0–37)
CO2: 27 mEq/L (ref 19–32)
Chloride: 101 mEq/L (ref 96–112)
Creatinine, Ser: 0.6 mg/dL (ref 0.4–1.2)
GFR: 99.03 mL/min (ref >60.00)

## 2010-07-05 LAB — TSH: TSH: 2.94 u[IU]/mL (ref 0.35–5.50)

## 2010-07-05 NOTE — Patient Instructions (Signed)

## 2010-07-05 NOTE — Assessment & Plan Note (Signed)
Will check labs today for efficacy and toxicity of the statin

## 2010-07-05 NOTE — Progress Notes (Signed)
Subjective:    Patient ID: [name], female    DOB: [birth year], 65 y.o.   MRN: [id number]  Hyperlipidemia This is a chronic problem. The current episode started more than 1 year ago. The problem is controlled. Recent lipid tests were reviewed and are normal. She has no history of diabetes, hypothyroidism or liver disease. Factors aggravating her hyperlipidemia include no known factors. Pertinent negatives include no chest pain, focal sensory loss, focal weakness, leg pain, myalgias or shortness of breath. Current antihyperlipidemic treatment includes statins. The current treatment provides significant improvement of lipids. There are no compliance problems.       Review of Systems  Constitutional: Negative for fever, chills, diaphoresis, activity change, appetite change, fatigue and unexpected weight change.  HENT: Negative for facial swelling, neck pain and neck stiffness.   Respiratory: Negative for apnea, cough, choking, chest tightness, shortness of breath, wheezing and stridor.   Cardiovascular: Negative for chest pain, palpitations and leg swelling.  Gastrointestinal: Negative for nausea, vomiting, abdominal pain, diarrhea, constipation, blood in stool and abdominal distention.  Genitourinary: Negative for dysuria, urgency, frequency, hematuria, flank pain, decreased urine volume, enuresis, difficulty urinating, genital sores and dyspareunia.  Musculoskeletal: Negative for myalgias, back pain, joint swelling, arthralgias and gait problem.  Skin: Negative for color change, pallor and rash.  Neurological: Negative for dizziness, tremors, focal weakness, seizures, syncope, facial asymmetry, speech difficulty, weakness, light-headedness, numbness and headaches.  Hematological: Negative for adenopathy. Does not bruise/bleed easily.  Psychiatric/Behavioral: Negative for behavioral problems, confusion, dysphoric mood and agitation. The patient is not nervous/anxious.        Objective:     Physical Exam  Constitutional: She is oriented to person, place, and time. She appears well-developed and well-nourished. No distress.  HENT:  Head: Normocephalic and atraumatic.  Right Ear: External ear normal.  Left Ear: External ear normal.  Nose: Nose normal.  Mouth/Throat: Oropharynx is clear and moist. No oropharyngeal exudate.  Eyes: Conjunctivae and EOM are normal. Pupils are equal, round, and reactive to light. Right eye exhibits no discharge. Left eye exhibits no discharge. No scleral icterus.  Neck: Normal range of motion. Neck supple. No JVD present. No tracheal deviation present. No thyromegaly present.  Cardiovascular: Normal rate, regular rhythm, normal heart sounds and intact distal pulses.  Exam reveals no gallop and no friction rub.   No murmur heard. Pulmonary/Chest: Effort normal and breath sounds normal. No respiratory distress. She has no wheezes. She has no rales. She exhibits no tenderness.  Abdominal: Soft. Bowel sounds are normal. She exhibits no distension and no mass. There is no tenderness. There is no rebound and no guarding.  Musculoskeletal: Normal range of motion. She exhibits no edema and no tenderness.  Lymphadenopathy:    She has no cervical adenopathy.  Neurological: She is alert and oriented to person, place, and time. She has normal reflexes. She displays normal reflexes. No cranial nerve deficit. She exhibits normal muscle tone. Coordination normal.  Skin: Skin is warm and dry. No rash noted. She is not diaphoretic. No erythema. No pallor.  Psychiatric: She has a normal mood and affect. Her behavior is normal. Judgment and thought content normal.       Lab Results  Component Value Date   WBC 7.0 02/13/2009   HGB 14.3 02/13/2009   HCT 41.9 02/13/2009   PLT 144.0* 02/13/2009   CHOL 145 02/13/2009   TRIG 50.0 02/13/2009   HDL 73.70 02/13/2009   ALT 26 02/13/2009   AST 27 02/13/2009  NA 143 02/13/2009   K 4.3 02/13/2009   CL 107 02/13/2009   CREATININE  0.7 02/13/2009   BUN 8 02/13/2009   CO2 31 02/13/2009   TSH 1.66 02/13/2009     Assessment & Plan:

## 2010-07-27 NOTE — Assessment & Plan Note (Signed)
[facility]                            CARDIOLOGY OFFICE NOTE   Karen, Hughes                      MRN:          [id number]  DATE:11/15/2006                            DOB:          Oct 12, 1945    [name] returns today for further management of the following  issues:  1. Coronary artery disease. She is status post coronary artery bypass      grafting. She is due a stress Myoview. She is having no symptoms of      angina at present.  2. Hyperlipidemia. She is now followed by Dr.  Artis Flock and has not had      recent lipids.   CURRENT MEDICATIONS:  1. Vytorin 10/80 daily.  2. Metoprolol 12.5 b.i.d.  3. Enteric coated aspirin 325 a day.   PHYSICAL EXAMINATION:  Blood pressure is 118/69, pulse 79 and regular.  Weight is 143.  HEENT: Is unremarkable and unchanged.  NECK: Supple. Carotid upstrokes are equal bilaterally without bruits.  There is no JVD.  Thyroid is not enlarged. Trachea is midline.  LUNGS:  Are clear.  HEART: Reveals a normal S1, S2 without murmur, rub or gallop.  ABDOMEN: Soft with good bowel sounds. There is no midline bruit.  EXTREMITIES: Reveals no cyanosis, clubbing or edema. Pulses are intact.  NEURO: Intact.   EKG shows sinus rhythm with minimal voltage criteria for LVH.   ASSESSMENT/PLAN:  [name] is doing well. I have arranged for her to  have an exercise rest/stress Myoview off of metoprolol. She would also  like to get fasting lipids, LFTs and a chem-7 that morning.   Assuming these are stable, I will see her back in a year.     Thomas C. Daleen Squibb, MD, [facility]  Electronically Signed    TCW/MedQ  DD: 11/15/2006  DT: 11/15/2006  Job #: [id number]   cc:   Quita Skye. Artis Flock, M.D.

## 2010-07-30 NOTE — Assessment & Plan Note (Signed)
[facility]                              CARDIOLOGY OFFICE NOTE   [name], [name]                      MRN:          [id number]  DATE:11/24/2005                            DOB:          1945-12-26    [name] returns today for further management of her coronary artery  disease.  Her lipids are being followed by Dr. Luanna Salk.  She promises me  that they are under good control.  Looking back at her labs, her total  cholesterol is 166, her LDL is 73, triglycerides 96,HDL 74 -  excellent.   MEDICATIONS:  1. She is on Vytorin 10/80.  2. Enteric-coated aspirin.  3. Lopressor 12.5 b.i.d.   She had a negative stress Myoview a year ago.   She is having no symptoms of angina.  Her angina on presentation was chest  burning with exertion.   EXAMINATION:  VITAL SIGNS:  Blood pressure 126/79.  Pulse is 77 and normal  and regular.  EKG is completely normal.  NECK:  Carotids are full without bruits.  There is no JVD.  Thyroid is  enlarged.  Trachea is midline.  LUNGS:  Clear.  HEART:  Shows a regular rate and rhythm.  Sternotomy site is intact and  stable.  ABDOMEN:  Soft, good bowel sounds.  EXTREMITIES:  No edema, pulses are intact.   I am delighted with how [name] is doing.  I have asked her to continue  with her current medical program.  We will see her back in a year.  At that  time she will need objective assessment for coronaries.                               Thomas C. Daleen Squibb, MD, [facility]    TCW/MedQ  DD:  11/24/2005  DT:  11/25/2005  Job #:  [id number]   cc:   Quita Skye. Artis Flock, M.D.

## 2010-07-30 NOTE — Cardiovascular Report (Signed)
[name], POULTON                         ACCOUNT NO.:  [id number]   MEDICAL RECORD NO.:  [id number]                   PATIENT TYPE:  OIB   LOCATION:  6501                                 FACILITY:  [facility]   PHYSICIAN:  Charlton Haws, M.D.                  DATE OF BIRTH:  1945-12-15   DATE OF PROCEDURE:  DATE OF DISCHARGE:                              CARDIAC CATHETERIZATION   CORONARY ARTERIOGRAPHY:   INDICATIONS:  Classic increasing angina over the last four weeks.   FINDINGS:  1. Catheterization was done from the right femoral artery.  2. Left main coronary artery had a 20% discrete stenosis.  3. The ostium of the LAD had a 90% tubular lesion that abutted the left     main.  The mid LAD had a 70% mid-vessel lesion, and there was significant     disease at the takeoff of the large diagonal branch with a 90% ostial     lesion, and a 90% mid-vessel lesion in the distal LAD, and 30% multiple     discrete lesions.  4. Circumflex coronary artery had 30% multiple discrete lesions in the mid     vessel.  The first obtuse marginal branch was normal.  5. The right coronary artery had 30 to 40% multiple discrete lesions in the     proximal and mid vessel.  The distal vessel was normal.   RAO VENTRICULOGRAPHY:  The RAO ventriculography was normal.  There were no  wall-motion abnormalities.  Ejection fraction was 70%.  Aortic pressure was  155/73.  LV pressure was 156/12.   IMPRESSION:  1. [name] has had somewhat accelerated symptoms over the last four     weeks.  The LAD ostial lesion abuts the left main, and there is complex     bifurcation of disease in the mid vessel.  Final advice would be to admit     the patient for evaluation of coronary artery bypass surgery.  I will     review the films with my interventional colleagues.  2. The patient was started on IV nitroglycerin and two liters of oxygen in     the room.  She was also given 1 mg of Versed for her anxiety after  hearing she had blocked arteries.  3. She tolerated the procedure well, and has not had any chest pain on the     table.                                               Charlton Haws, M.D.    PN/MEDQ  D:  09/09/2003  T:  09/09/2003  Job:  [id number]   cc:   Thomas C. Wall, M.D.   Quita Skye Artis Flock, M.D.  [address]  [place]  [place] [address]  Fax: [phone]

## 2010-07-30 NOTE — Consult Note (Signed)
[name], [name]                         ACCOUNT NO.:  [id number]   MEDICAL RECORD NO.:  [id number]                   PATIENT TYPE:  INP   LOCATION:  2019                                 FACILITY:  [facility]   PHYSICIAN:  Salvatore Decent. Cornelius Moras, M.D.              DATE OF BIRTH:  Oct 15, 1945   DATE OF CONSULTATION:  09/09/2003  DATE OF DISCHARGE:                                   CONSULTATION   CONSULTING PHYSICIAN:  Salvatore Decent. Cornelius Moras, M.D.   REQUESTING PHYSICIAN:  Charlton Haws, M.D.   PRIMARY CARDIOLOGIST:  Jesse Sans. Wall, M.D.   PRIMARY CARE PHYSICIAN:  Quita Skye. Artis Flock, M.D.   REASON FOR CONSULTATION:  Severe single-vessel coronary artery disease with  new onset exertional angina.   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  She has no previous  history of coronary artery disease, but she has risk factors notable for the  presence of hyperlipidemia, a strong family history of coronary artery  disease, and a remote history of tobacco use.  She has been in her usual  state of health with relatively active physical lifestyle, until the last 1-  2 months when she has developed burning substernal chest pain brought on  with physical exercise and relieved by rest.  Over the last 6-8 weeks, these  episodes of chest pain have increased in frequency and severity, although  they are always brought on with exertion and relieved by rest.  She  specifically denies any episodes of chest pain at rest or at night waking  her from sleep.  The chest pain is associated with mild shortness of breath  when it occurs.  She otherwise denies problems with progressive shortness of  breath either at rest or with activity, and she specifically denies any  history of orthopnea, PND, lower extremity edema, palpitations, or syncope.  She described these  symptoms to Dr. Artis Flock and was promptly referred to Dr.  Juanito Doom who evaluated her on September 05, 2003.  She was subsequently scheduled  for elective cardiac catheterization which was performed earlier today by  Dr. Charlton Haws.  Findings at the time of catheterization include:  Severe  coronary artery disease with high grade ostial stenosis of the left anterior  descending coronary artery and coronary anatomy unfavorable for a  percutaneous coronary intervention.  Left ventricular function is preserved.  Cardiac surgical consultation has been requested.   REVIEW OF SYSTEMS:  GENERAL:  The patient reports feeling well otherwise.  She has a good appetite and has not been gaining or loosing weight.  CARDIAC:  As described previously.  RESPIRATORY:  Notable for the absence of  productive cough, hemoptysis, wheezing.  GASTROINTESTINAL:  Negative.  The  patient denies difficulty swallowing.  She repots normal  bowel function.  She has no history of hematochezia, hematemesis, nor melena.  MUSCULOSKELETAL:  Notable for some occasional cramping pains in the calf  muscles of both legs, more so on the right than the left.  These cramps  occur at night while she is lying in bed and do not occur with exercise.  She otherwise denies problems with arthritis or arthralgias.  NEUROLOGIC:  Negative.  The patient specifically denies symptoms suggestive of previous  TIA or stroke.  She denies a history of seizures.  INFECTIOUS:  Negative.  The patient denies recent fevers or chills.  GENITOURINARY:  Notable in that  the patient has urinary frequency.  She denies urinary urgency, hematuria,  or dysuria.  She has recently been started on Detrol for these symptoms.  She denies known history of bladder infections.  HEENT:  Negative.  The  patient wears glasses for corrective vision, but her eyesight is stable.  She has no loose teeth or painful teeth.  PSYCHIATRIC:  Negative.  The  patient has not been under  increased stress recently.   PAST MEDICAL HISTORY:  1. Hyperlipidemia.  2. Remote history tobacco use.  3. Remote history cervical cancer.  4. Recent onset urinary frequency.   PAST SURGICAL HISTORY:  Cervical conization, 1989.   FAMILY HISTORY:  The patient's father suffered from premature coronary  artery disease, having developed his first heart attack at age 48 and  suffering a fatal heart attack at age 22.   SOCIAL HISTORY:  The patient is married with three children and several  grandchildren.  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  She has a remote history of  tobacco use, having smoked heavily for many years, but she quit smoking 25  years ago.  She denies history of excessive alcohol consumption.   CURRENT MEDICATIONS:  1. Enteric coated aspirin 325 mg daily.  2. Detrol long-acting one tablet daily.  3. Vitamin D with calcium supplement once daily.   DRUG ALLERGIES:  None known.   PHYSICAL EXAMINATION:  GENERAL:  Notable for a well-appearing female who  appears her stated age in no acute distress.  She is currently in a normal  sinus rhythm on telemetry monitor.  VITAL SIGNS:  She is normotensive and afebrile.  HEENT:  Grossly unrevealing.  NECK:  Supple.  There is no cervical nor supraclavicular lymphadenopathy.  There is no jugular venous distention.  No carotid bruits are noted.  CHEST:  Auscultation of the chest includes clear and symmetrical breath  sounds bilaterally.  No wheezes or rhonchi are demonstrated.  CARDIOVASCULAR:  Notable for a regular rate and rhythm.  There is a soft,  grade 2/6, systolic murmur heard best at the sternal border consistent with  a flow murmur.  No diastolic murmurs are noted.  ABDOMEN:  Soft and nontender.  Bowel sounds are present.  There are no  palpable masses. EXTREMITIES:  Warm and well perfused.  There is no lower extremity edema.  Distal pulses are easily  palpable in both lower extremities in the posterior  tibial position.  There is no sign of venous insufficiency.  RECTAL:  Deferred.  GU:  Deferred.  NEUROLOGIC:  Grossly nonfocal and symmetrical throughout.   LABORATORY DATA:  Complete blood count performed, September 03, 2003,  demonstrates white blood count 7,500, hemoglobin 14.4, hematocrit 40%,  platelet count 253,000.  Basic metabolic profile includes sodium 130,  potassium 4.0, chloride 92, bicarbonate 29,  BUN 9, creatinine 0.7, glucose  173.  Prothrombin time 11.7, INR of 1.0, PTT 27.6 seconds.  Fasting lipid  profile, performed September 02, 2003, is notable for a total cholesterol of 276  with triglycerides 66, HDL cholesterol 91, LDL cholesterol 172.   DIAGNOSTIC TESTS:  Cardiac catheterization, performed today by Dr. Eden Emms,  has been reviewed.  This demonstrate severe single-vessel, possibly two-  vessel coronary artery disease with coronary anatomy unfavorable for  percutaneous coronary intervention.  Specifically, there is long segment 90%  ostial stenosis of the left anterior descending coronary artery beginning at  the bifurcation of the left main coronary artery.  There is 70-80% stenosis  of the mid portion of the left anterior descending coronary artery beyond  the takeoff of the first diagonal branch.  There is 80-90% stenosis of the  first diagonal branch.  There are luminal irregularities in the left  circumflex coronary artery with 20-30% stenosis of the right coronary artery  and right dominant coronary circulation.  There does appear to be a 50-60%  stenosis of the posterior descending coronary artery.  Left ventricular  function is normal with no significant wall motion abnormalities.   IMPRESSION:  Severe coronary artery disease with high grade ostial stenosis  of the left anterior descending coronary artery in coronary anatomy  unfavorable for percutaneous coronary intervention.  The patient has  symptoms of new  onset exertional angina (class II).  She has normal left  ventricular function.  I believe she would best be treated by elective  coronary artery bypass grafting.   PLAN:  I have outlined options at length with Ms. Suchocki and her family.  Alternative treatment strategies have been discussed.  They understand and  accept all associated risk of surgery including but not limited to risk of  death, stroke, myocardial infarction, congestive heart failure, respiratory  failure, pneumonia, bleeding requiring blood transfusion, arrhythmia,  infection, and recurrent coronary artery disease.  All of their questions  have been addressed.  We tentatively plan to proceed with surgery on  Thursday, September 11, 2003.                                               Salvatore Decent. Cornelius Moras, M.D.    CHO/MEDQ  D:  09/09/2003  T:  09/09/2003  Job:  [id number]   cc:   Charlton Haws, M.D.   Thomas C. Wall, M.D.  Quita Skye Artis Flock, M.D.  [address]  [place]  [place] [address]  Fax: [phone]   patient's chart

## 2010-07-30 NOTE — H&P (Signed)
NAMESHERELLE, CASTELLI                         ACCOUNT NO.:  [id number]   MEDICAL RECORD NO.:  [id number]                   PATIENT TYPE:  INP   LOCATION:  2019                                 FACILITY:  [facility]   PHYSICIAN:  Charlton Haws, M.D.                  DATE OF BIRTH:  [birth year]   DATE OF ADMISSION:  09/09/2003  DATE OF DISCHARGE:                                HISTORY & PHYSICAL   PRIMARY CARE PHYSICIAN:  Quita Skye. Artis Flock, M.D. and cardiologist is Jesse Sans.  Wall, M.D.   CHIEF COMPLAINT:  I had a catheterization study today.   HISTORY OF PRESENT ILLNESS:  The patient is a 65 year old female with a  history of focal chest discomfort/burning which occurs only with exertion  for the past month.  She has no concomitant symptoms such as dyspnea,  radiation, diaphoresis, or nausea and vomiting.  She was seen in the office  by Dr. Daleen Squibb on Friday, June 24.  He ordered a left heart catheterization  suspecting a focal cardiac coronary artery lesion.  This was done as an  elective study today demonstrating a 90% ostial stenosis in the LAD which  abuts the left main.  There is a 70% midpoint stenosis in the LAD which is  complex at the bifurcation in mid LAD.  Ejection fraction is 60%.  Currently, the patient is in no discomfort on IV nitroglycerin.   ALLERGIES:  No known drug allergies.   MEDICATIONS:  1. Calcium with vitamin D 600 mg daily.  2. Detrol 4 mg daily.  3. Enteric-coated aspirin 325 mg daily.   PAST MEDICAL HISTORY:  1. Remote tobacco.  2. Hyperlipidemia.  3. History of premature coronary artery disease in her father.  No prior     history of diabetes, hypertension, pulmonary embolism, deep venous     thrombosis, peptic ulcer disease, GI bleed, thyroid disease,     cerebrovascular accident, myocardial infarction.   PAST SURGICAL HISTORY:  History of cervical cancer in 1989.   REVIEW OF SYSTEMS:  The patient is not currently having fever, chills,  weight change, or  adenopathy.  HEENT:  Not experiencing hoarseness,  photophobia, or vertigo.  INTEGUMENT:  No ulcerations, rashes, nonhealing  lesions.  CARDIOPULMONARY:  No presyncope or syncope.  She is having  exertional chest pain, no dyspnea, no orthopnea, and no PND.  GASTROINTESTINAL:  No GERD symptoms, no melena, no hematemesis.  NEUROLOGY:  No seizure history, no syncope history. PSYCHIATRIC:  No depression or  anxiety.   PHYSICAL EXAMINATION:  VITAL SIGNS:  Temperature 97 degrees, blood pressure  127/72, pulse 65 and regular, respirations 18.  GENERAL:  The patient is alert and oriented and in no acute distress,  breathing easily.  LUNGS:  Clear to auscultation and percussion bilaterally.  NECK:  No bruits, no carotid bruits auscultated.  No cervical  lymphadenopathy, no jugular venous distention.  HEART:  Regular rate and rhythm without murmur.  Telemetry shows sinus  rhythm.  ABDOMEN:  Soft and nondistended.  Bowel sounds are present.  Abdominal aorta  is nonpulsatile.  EXTREMITIES:  Radial pulses are 4/4 bilaterally.  Dorsalis pedis pulses are  4/4 bilaterally.  No peripheral edema.   SOCIAL HISTORY:  The patient lives in [place] with her husband.  She is a  remote smoker and does not take alcoholic beverages.   FAMILY HISTORY:  Father died at age 40 of a myocardial infarction, but  suffered his first heart attack at age 40.  Her mother died at age 42.  Recently in 07-16-22, she had a history of diabetes and hypertension.  She one  brother who is alive and well, but does have a history of diabetes.   IMPRESSION:  1. Admission with exertional angina.  2. Finding of high grade complex lesions in the LAD on left heart     catheterization, September 09, 2003.  3. Dyslipidemia.  4. Premature coronary artery disease in her father.   PLAN:  [facility] has set up the patient for coronary artery bypass graft surgery.  We will initiate low dose beta blocker.  Continue IV nitroglycerin.  Start  Vytorin  10/40 every evening.      Maple Mirza, P.A.                    Charlton Haws, M.D.    GM/MEDQ  D:  09/09/2003  T:  09/09/2003  Job:  [id number]

## 2010-07-30 NOTE — Discharge Summary (Signed)
NAMEHALIMAH, BEWICK                         ACCOUNT NO.:  [id number]   MEDICAL RECORD NO.:  [id number]                   PATIENT TYPE:  INP   LOCATION:  2001                                 FACILITY:  [facility]   PHYSICIAN:  Salvatore Decent. Cornelius Moras, M.D.              DATE OF BIRTH:  1945/07/16   DATE OF ADMISSION:  09/09/2003  DATE OF DISCHARGE:  09/15/2003                                 DISCHARGE SUMMARY   ANTICIPATED DATE OF DISCHARGE:  September 15, 2003.   ADMITTING DIAGNOSIS:  Coronary artery disease with new onset exertional  angina.   PAST MEDICAL HISTORY:  1. Hyperlipidemia.  2. Remote history of cervical cancer with cervical colonization in 1989.  3. Recent onset urinary frequency, treated with Detrol.  4. Remote history of tobacco use.   The patient has no known drug allergies.   DISCHARGE DIAGNOSIS:  Severe single vessel coronary artery disease with new  onset exertional angina, status post coronary artery bypass grafting.   BRIEF HISTORY:  Ms. Pascuzzi is a 65 year old Caucasian female.  She was in  her usual state of health with a relatively physical lifestyle until one to  two months prior to hospital admission.  She began developing burning  substernal chest pain with physical exercise.  This was relieved by rest.  She described these symptoms to Dr. Artis Flock and was promptly referred to Dr.  Daleen Squibb, who evaluated her on September 05, 2003.  He recommended elective cardiac  catheterization and this was performed by Dr. Charlton Haws on September 09, 2003.  Catheterization revealed severe coronary artery disease with a high grade  ostial stenosis of the left anterior descending artery that was not amenable  to PCI.  Cardiac surgery consultation was requested.   HOSPITAL COURSE:  On June 28, Ms. Mckeithan was admitted to [facility] in the care of [facility] following her  cardiac catheterization.  She was evaluated in consultation by Dr. Tressie Stalker.   After examination of the patient and review of the available records,  Dr. Cornelius Moras agreed that coronary artery bypass grafting was the preferred  treatment choice for this woman.  The procedure, risks and benefits were all  discussed with Ms. Mira and she agreed to proceed with surgery.  She was  scheduled for the first available OR time on September 11, 2003.   Preoperative arterial evaluation included carotid Doppler studies which  revealed no significant carotid artery disease.  Her upper extremity exam  revealed Allen's test that was normal on the right and abnormal on the left.  Lower extremity exam revealed palpable distal pulses bilaterally.   Ms. Schoenfeldt remained stable in the hospital while awaiting her surgery   On September 11, 2003, Ms. Franklin underwent the following surgical procedure  with Dr. Tressie Stalker:  Coronary artery bypass grafting times three.  Grafts placed at time of the procedure:  Left internal mammary  artery graft  to the left anterior descending artery, saphenous vein graft to the  posterior descending artery, and saphenous vein graft to the diagonal  artery.  Vein was harvested from the left thigh using endovein harvesting  technique.  Dr. Cornelius Moras requested from anesthesia a perioperative  transesophageal echocardiogram to evaluate a 2/6 systolic murmur.  Preoperative TEE was performed by Dr. Jean Rosenthal and she found this to be a  normal exam.  Post bypass exam also remained normal.  Ms. Mires tolerated  these procedures well, transferring in stable condition to the SICU.  She  remained hemodynamically stable in the immediate perioperative period and  was extubated several hours after arrival in the intensive care unit.  She  awoke from anesthesia neurologically intact.  She required only routine care  in the intensive care unit and was transferred to unit 2000 on postoperative  day one.   Ms. Senat postoperative course has been essentially uneventful and she  is  making good progress recovering from her surgery.  This morning, September 14, 2003, postoperative day three and she reports feeling very well.  Her vital  signs are stable.  Blood pressure 100/68; she is afebrile; her room air  saturation is 96%.  Her heart is maintaining normal sinus rhythm, 74 beats  per minute.  Her lungs are clear to auscultation.  She is tolerating small  amounts of a regular diet.  Her bowel and bladder functions are within  normal limits for her.  Her incisions are all healing well.  She has no  lower extremity edema.  She is ambulating with minimal assistance.  Her pain  is well controlled.  She does remain approximately 10 pounds over her  preoperative weight and she is being treated with a diuretic.  This will  continue post discharge.  If Ms. Minter continues to progress in this  manner, it is anticipated that she will be ready for discharge home  tomorrow, September 15, 2003.   Laboratory studies July 2:  CBC showed white blood cells 9.5, hemoglobin  11.9, hematocrit 35.0, platelets 128.  Chemistries include sodium 142,  potassium 4.3, BUN 6, creatinine 0.8, glucose 106.   CONDITION ON DISCHARGE:  Improved.   INSTRUCTIONS ON DISCHARGE:  1. Medications:     A. Enteric coated aspirin 325 mg daily.     B. Metoprolol 25 mg one half tablet b.i.d.     C. Vytorin 10/40 mg one q.h.s.     D. Lasix 40 mg q.d. for five days.     E. Potassium chloride 20 mEq q.d. for five days.     F. She is to resume her home medication of Detrol 4 mg p.o. q.d.  2. For pain management she may have Tylox one to two p.o. q.4 h. p.r.n.     moderate to severe pain or Tylenol 325 mg one to two p.o. q.4 h. p.r.n.     mild pain.  3. Activity - She has been asked to refrain from any driving or any heavy     lifting, pushing or pulling.  She was also instructed to continue her     breathing exercises and daily walking. 4. Diet - Should continue to be a low-fat, low-salt diet.  5. Wound care - She  is to shower daily with mild soap and water.  If her     incisions show any signs of infection or if she has a temperature greater     than 101 degrees Fahrenheit,  she is to call Dr. Orvan July office.   FOLLOW UP:  1. She has an appointment to see Dr. Daleen Squibb in his office on July 19 at 2:45     p.m.  She will have a chest x-ray taken that day.  2. Dr. Cornelius Moras would like to see her back in the [facility] office in approximately     three weeks.  The office will call with the date and time for that     appointment.  She will be asked to bring her chest x-ray from Dr. Vern Claude     office for Dr. Cornelius Moras to review.      Toribio Harbour, N.P.                  Salvatore Decent. Cornelius Moras, M.D.    CTK/MEDQ  D:  09/14/2003  T:  09/15/2003  Job:  [id number]   cc:   Salvatore Decent. Cornelius Moras, M.D.  [address]  [place]  [place] [address]   Jesse Sans. Wall, M.D.   Quita Skye Artis Flock, M.D.  [address]  [place]  [place] [address]  Fax: [phone]

## 2010-07-30 NOTE — Op Note (Signed)
[name], [name]                         ACCOUNT NO.:  [id number]   MEDICAL RECORD NO.:  [id number]                   PATIENT TYPE:  INP   LOCATION:  2307                                 FACILITY:  [facility]   PHYSICIAN:  Salvatore Decent. Cornelius Moras, M.D.              DATE OF BIRTH:  23-Jul-1945   DATE OF PROCEDURE:  09/11/2003  DATE OF DISCHARGE:                                 OPERATIVE REPORT   PREOPERATIVE DIAGNOSIS:  Severe single vessel coronary artery disease with  new onset exertional angina.   POSTOPERATIVE DIAGNOSIS:  Severe single vessel coronary artery disease with  new onset exertional angina.   OPERATION PERFORMED:  Median sternotomy for coronary artery bypass grafting  times three (left internal mammary artery to the distal left anterior  descending coronary artery, saphenous vein graft to the first diagonal  branch, saphenous vein graft to posterior descending coronary artery,  endoscopic saphenous vein harvest from left thigh).   SURGEON:  Salvatore Decent. Cornelius Moras, M.D.   ASSISTANT:  Pecola Leisure, PA   ANESTHESIA:  General.   INDICATIONS FOR PROCEDURE:  The patient is a 65 year old female with no  previous history of coronary artery disease, but risk factors notable for  hyperlipidemia, a strong family history of coronary artery disease, and  remote history of tobacco use.  She presents with new onset symptoms of  angina.  Cardiac catheterization demonstrates severe single coronary artery  disease with coronary anatomy unfavorable for percutaneous coronary  intervention.  There is also moderate stenosis involving the posterior  descending coronary artery as a distal branch off the right coronary artery.  Left ventricular function is normal.   OPERATIVE CONSENT:  The patient and her husband have been counseled at  length regarding the indications and potential benefits of coronary artery  bypass grafting.  Alternative treatment strategies have been discussed.  They  understand and accept all associated risks of surgery and desire to  proceed as described.   DESCRIPTION OF PROCEDURE:  The patient was brought to the operating room on  the above mentioned date and central monitoring was established by the  anesthesia service.  Specifically, a Swann-Ganz catheter was placed through  the right internal jugular approach.  A radial arterial line was placed.  Intravenous antibiotics were administered.  Following induction with general  endotracheal anesthesia, a Foley catheter was placed.  The patient's chest,  abdomen, both groins and both lower extremities were prepared and draped in  a sterile manner.   Baseline transesophageal echocardiogram was performed and demonstrated  normal left ventricular function.  There are no significant wall motion  abnormalities.  There were no significant valve related abnormalities.  A  median sternotomy incision was performed and the left internal mammary  artery was dissected from the chest wall and prepared for bypass grafting.  The left internal mammary artery was small caliber but otherwise good  quality conduit with good forward flow. Simultaneously,  saphenous vein was  obtained from the patient's left thigh using endoscopic vein harvest  technique.  Initially, two small incisions were made in the right thigh, but  the veins on the right side were too small to be felt to be utilized to be  harvested for grafting.  Subsequently, the saphenous vein was removed from  the left side with endoscopic harvest technique.  The saphenous vein which  was removed was slightly small caliber but otherwise good quality conduit.  After the saphenous vein was removed, all of the incisions in both thighs  were closed with multiple layers with running absorbable suture.  The  patient was systemically heparinized.   The pericardium was opened.  The ascending aorta was normal in appearance.  The epicardial coronary arteries were small  caliber.  The heart was normal  sized.  No other abnormalities were noted.  The ascending aorta was  cannulated for cardiopulmonary bypass.  The venous cannula was placed in the  right atrium.  Adequate heparinization was verified.  Cardiopulmonary bypass  was begun and the surface of the heart was inspected.  Distal sites were  selected for coronary artery bypass grafting.  Portions of saphenous vein  and the left internal mammary artery were trimmed to appropriate lengths.  A  temperature probe was placed in the left ventricular septum.  A  cardioplegia catheter was placed in the ascending aorta.   The patient was allowed to cool passively to 32 degrees systemic  temperature.  The aortic cross-clamp was applied and cardioplegia was  delivered in the antegrade fashion through the aortic root.  Iced saline  slush was applied for topical hypothermia.  The initial cardioplegic arrest  and myocardial cooling were felt to be excellent.  Repeat doses of  cardioplegia were administered intermittently throughout the cross-clamp  portion of the operation both through the aortic root and down the  subsequently placed vein graft to maintain septal temperature below 15  degrees centigrade.  The following distal coronary anastomoses were  performed:  (1)  The posterior descending coronary artery was grafted with a  saphenous vein graft in end-to-side fashion.  This coronary measured 1.5 mm  in diameter and is of good quality at the site of the distal bypass.  There  was 60% proximal stenosis.  (2)  The first diagonal branch off the left  anterior descending coronary artery was grafted with the saphenous vein  graft in an end-to-side fashion.  This coronary measured 1.2 mm in diameter  at the site of distal bypass and was of good quality.  There was 90%  proximal stenosis.  (3)  The distal left anterior descending coronary artery was grafted with the left internal mammary artery in end-to-side  fashion.  This coronary measured 1.3 mm in diameter and was of good quality at the  site of distal bypass.  There is 90% proximal stenosis.  Both proximal  saphenous vein anastomoses were performed directly to the ascending aorta  prior to removal of the aortic cross-clamp.  The septal temperature was  noted to rise rapidly upon reperfusion of the left internal mammary artery.  The aortic cross-clamp was removed after a total cross-clamp time of 51  minutes.   The heart began to beat spontaneously and normal sinus rhythm resumed.  All  proximal and distal anastomoses were inspected for hemostasis and  appropriate graft orientation.  Epicardial pacing wires were fixed to the  right ventricular outflow tract and to the right atrial appendage.  The  patient has rewarmed to 37 degrees centigrade temperature.  The patient was  weaned from cardiopulmonary bypass without difficulty.  The patient's rhythm  at separation from bypass was normal sinus rhythm.  No inotropic support was  required.  Total cardiopulmonary bypass time for the operation was 54  minutes.  Follow-up transesophageal echocardiogram performed after  separation from bypass demonstrated normal left ventricular function.   The venous and arterial cannulae were removed uneventfully. Protamine was  administered to reverse the anticoagulation.  The mediastinum and the left  chest were irrigated with saline solution containing vancomycin.  Meticulous  surgical hemostasis was ascertained.  The mediastinum and the left chest  were drained with three chest tubes placed through separate stab incisions  inferiorly.  The median sternotomy was closed in routine fashion.  The soft  tissues anterior to the sternum were closed in multiple layers and the skin  was closed with a running subcuticular skin closure.  The patient tolerated  the procedure well and was transported to the surgical intensive care  unit in stable condition.  There were  no intraoperative complications.  All  sponge, needle and instrument counts were verified correct at completion of  the operation.  The patient was transfused two units packed red blood cells  during cardiopulmonary bypass due to anemia.                                               Salvatore Decent. Cornelius Moras, M.D.    CHO/MEDQ  D:  09/11/2003  T:  09/12/2003  Job:  [id number]   cc:   Thomas C. Wall, M.D.   Charlton Haws, M.D.   Quita Skye Artis Flock, M.D.  [address]  [place]  [place] [address]  Fax: [phone]

## 2010-09-30 ENCOUNTER — Encounter: Payer: Self-pay | Admitting: Internal Medicine

## 2010-09-30 ENCOUNTER — Other Ambulatory Visit (INDEPENDENT_AMBULATORY_CARE_PROVIDER_SITE_OTHER): Payer: 59

## 2010-09-30 ENCOUNTER — Ambulatory Visit (INDEPENDENT_AMBULATORY_CARE_PROVIDER_SITE_OTHER): Payer: 59 | Admitting: Internal Medicine

## 2010-09-30 ENCOUNTER — Ambulatory Visit (INDEPENDENT_AMBULATORY_CARE_PROVIDER_SITE_OTHER)
Admission: RE | Admit: 2010-09-30 | Discharge: 2010-09-30 | Disposition: A | Discharge status: Home / Self Care | Payer: 59 | Source: Ambulatory Visit | Attending: Internal Medicine | Admitting: Internal Medicine

## 2010-09-30 VITALS — BP 132/74 | HR 60 | Temp 98.8°F | Resp 16 | Wt 134.0 lb

## 2010-09-30 DIAGNOSIS — M65849 Other synovitis and tenosynovitis, unspecified hand: Secondary | ICD-10-CM

## 2010-09-30 DIAGNOSIS — M659 Synovitis and tenosynovitis, unspecified: Secondary | ICD-10-CM

## 2010-09-30 DIAGNOSIS — M65839 Other synovitis and tenosynovitis, unspecified forearm: Secondary | ICD-10-CM

## 2010-09-30 DIAGNOSIS — M1990 Unspecified osteoarthritis, unspecified site: Secondary | ICD-10-CM

## 2010-09-30 DIAGNOSIS — M064 Inflammatory polyarthropathy: Secondary | ICD-10-CM

## 2010-09-30 DIAGNOSIS — M7989 Other specified soft tissue disorders: Secondary | ICD-10-CM

## 2010-09-30 DIAGNOSIS — M65949 Unspecified synovitis and tenosynovitis, unspecified hand: Secondary | ICD-10-CM | POA: Insufficient documentation

## 2010-09-30 DIAGNOSIS — E785 Hyperlipidemia, unspecified: Secondary | ICD-10-CM

## 2010-09-30 LAB — CBC WITH DIFFERENTIAL/PLATELET
Basophils Absolute: 0 10*3/uL (ref 0.0–0.1)
Eosinophils Relative: 4.1 % (ref 0.0–5.0)
Hemoglobin: 14.1 g/dL (ref 12.0–15.0)
Lymphs Abs: 1.6 10*3/uL (ref 0.7–4.0)
Neutrophils Relative %: 69.7 % (ref 43.0–77.0)
Platelets: 176 10*3/uL (ref 150.0–400.0)
RDW: 12.7 % (ref 11.5–14.6)
WBC: 7.9 10*3/uL (ref 4.5–10.5)

## 2010-09-30 LAB — COMPREHENSIVE METABOLIC PANEL
AST: 22 U/L (ref 0–37)
CO2: 30 mEq/L (ref 19–32)
Chloride: 98 mEq/L (ref 96–112)
Glucose, Bld: 84 mg/dL (ref 70–99)
Potassium: 4.3 mEq/L (ref 3.5–5.1)
Total Protein: 7.6 g/dL (ref 6.0–8.3)

## 2010-09-30 MED ORDER — CELECOXIB 200 MG PO CAPS: 200.0000 mg | ORAL_CAPSULE | Freq: Every day | ORAL | Status: AC

## 2010-09-30 NOTE — Patient Instructions (Signed)
Tendinitis and Tenosynovitis    Tendinitis is inflammation of the tendon. Tenosynovitis is inflammation of the lining around the tendon (tendon sheath). These painful conditions often occur at once. Tendons attach muscle to bone. To move a limb, force from the muscle moves through the tendon, to the bone. These conditions often cause increased pain when moving. Tendinitis may be caused by a small or partial tear in the tendon.    COMMON SIGNS AND SYMPTOMS   Pain, tenderness, redness, bruising, or swelling at the injury.  Loss of normal joint movement.  Pain that gets worse with use of the muscle and joint attached to the tendon.  Weakness in the tendon, caused by calcium build up that may occur with tendinitis.  Commonly affected tendons:  l Achilles tendon (calf of leg). l Rotator cuff (shoulder joint). l Patellar tendon (kneecap to shin). l Peroneal tendon (ankle). l Posterior tibial tendon (inner ankle). l Biceps tendon (in front of shoulder).   CAUSES   Sudden strain on a flexed muscle, muscle overuse, sudden increase or change in activity, vigorous activity.  Result of a direct hit (less common).  Poor muscle action (biomechanics).   RISK INCREASES WITH   Injury (trauma).  Too much exercise.  Sudden change in athletic activity.  Incorrect exercise form or technique.  Poor strength and flexibility.  Not warming-up properly before activity.  Returning to activity before healing is complete.    PREVENTIVE MEASURES   Warm-up and stretch properly before activity.  Maintain physical fitness: l Joint flexibility. l Muscle strength and endurance. l Fitness that increases heart rate.  Learn and use proper exercise techniques.  Use rehabilitation exercises to strengthen weak muscles and tendons.  Ice the tendon after activity, to reduce recurring inflammation.  Wear proper fitting protective equipment for specific tendons, when indicated.    PROGNOSIS When  treated properly, can be cured in 6 to 8 weeks. Recovery may take longer, depending on degree of injury.    POSSIBLE COMPLICATIONS   Re-injury or recurring symptoms.  Permanent weakness or joint stiffness, if injury is severe and recovery is not completed.  Delayed healing, if sports are started before healing is complete.  Tearing apart (rupture) of the inflamed tendon. Tendinitis means the tendon is injured and must recover.   GENERAL TREATMENT CONSIDERATIONS  Treatment first involves ice, medicine, and rest from aggravating activities. This reduces pain and inflammation. Modifying your activity may be considered to prevent recurring injury. A brace, elastic bandage wrap, splint, cast, or sling may be prescribed to protect the joint for a short period. After that period, strengthening and stretching exercise may help to regain strength and full range of motion. If the condition persists, despite non-surgical treatment, surgery may be recommended to remove the inflamed tendon lining. Corticosteroid injections may be given to reduce inflammation. However, these injections may weaken the tendon and increase your risk for tendon rupture.   MEDICATION   If pain medicine is needed, nonsteroidal anti-inflammatory medicines (aspirin and ibuprofen), or other minor pain relievers (acetaminophen), are often recommended.   Do not take pain medicine for 7 days before surgery.   Prescription pain relievers are usually prescribed only after surgery. Use only as directed and only as much as you need.  Ointments applied to the skin may be helpful.  Corticosteroid injections may be given to reduce inflammation. However, this may increase your risk of a tendon rupture.   HEAT AND COLD   Cold treatment (icing) relieves pain and reduces inflammation.  Cold treatment should be applied for 10 to 15 minutes every 2 to 3 hours, and immediately after activity that aggravates your symptoms. Use ice packs or an  ice massage.  Heat treatment may be used before performing stretching and strengthening activities prescribed by your caregiver, physical therapist, or athletic trainer. Use a heat pack or a warm water soak.   SEEK MEDICAL CARE IF:   Symptoms get worse or do not improve, despite treatment.  Pain becomes too much to tolerate.  You develop numbness or tingling.  Toes become cold, or toenails become blue, gray, or dark colored.  New, unexplained symptoms develop. (Drugs used in treatment may produce side effects.)   Document Released: 02/28/2005  Document Re-Released: 12/26/2008 [facility] Patient Information 2011 [place], [place].

## 2010-10-01 ENCOUNTER — Encounter: Payer: Self-pay | Admitting: Internal Medicine

## 2010-10-01 LAB — C-REACTIVE PROTEIN: CRP: 0 mg/dL (ref <0.6)

## 2010-10-01 LAB — ANA: Anti Nuclear Antibody(ANA): NEGATIVE

## 2010-10-01 NOTE — Assessment & Plan Note (Signed)
I will xray the joint to look for a destructive process or evidence of inflammation

## 2010-10-01 NOTE — Assessment & Plan Note (Signed)
She was given pt ed material and she was asked to ice, rest, and take celebrex as directed

## 2010-10-01 NOTE — Assessment & Plan Note (Signed)
I will check some labs to look for an inflammatory process such as RA, lupus, infectious, etc

## 2010-10-01 NOTE — Progress Notes (Signed)
  Subjective:    Patient ID: [name], female    DOB: [birth year], 65 y.o.   MRN: [id number]  HPI She returns c/o gradual onset of pain/redness/swelling in her right 3rd MCP joint. She does repetitive activity with typing but denies any specific trauma or injury.    Review of Systems  Constitutional: Negative.   HENT: Negative.   Eyes: Negative.   Respiratory: Negative.   Cardiovascular: Negative.   Gastrointestinal: Negative.   Genitourinary: Negative.   Musculoskeletal: Positive for joint swelling and arthralgias. Negative for myalgias, back pain and gait problem.  Skin: Negative.   Neurological: Negative.   Hematological: Negative.   Psychiatric/Behavioral: Negative.        Objective:   Physical Exam  Vitals reviewed. Constitutional: She appears well-developed and well-nourished. No distress.  HENT:  Head: Normocephalic and atraumatic.  Right Ear: External ear normal.  Left Ear: External ear normal.  Nose: Nose normal.  Mouth/Throat: Oropharynx is clear and moist. No oropharyngeal exudate.  Eyes: Conjunctivae and EOM are normal. Pupils are equal, round, and reactive to light. Right eye exhibits no discharge. Left eye exhibits no discharge. No scleral icterus.  Neck: Normal range of motion. Neck supple. No JVD present. No tracheal deviation present. No thyromegaly present.  Cardiovascular: Normal rate, regular rhythm, normal heart sounds and intact distal pulses.  Exam reveals no gallop and no friction rub.   No murmur heard. Pulmonary/Chest: Effort normal and breath sounds normal. No stridor. No respiratory distress. She has no wheezes. She has no rales. She exhibits no tenderness.  Abdominal: Soft. Bowel sounds are normal. She exhibits no distension and no mass. There is no tenderness. There is no rebound and no guarding.  Musculoskeletal: Normal range of motion. She exhibits edema and tenderness.       Right hand: She exhibits tenderness (Right 3rd MCP joint with  hypertrophy, warmth, erythema), bony tenderness and swelling (right 3rd MCP joint). She exhibits normal range of motion, normal capillary refill, no deformity and no laceration. normal sensation noted. Decreased sensation is not present in the ulnar distribution, is not present in the medial distribution and is not present in the radial distribution. Normal strength noted. She exhibits no finger abduction, no thumb/finger opposition and no wrist extension trouble.       Hands: Lymphadenopathy:    She has no cervical adenopathy.  Neurological: She is alert. She has normal reflexes. She displays normal reflexes. No cranial nerve deficit. She exhibits normal muscle tone. Coordination normal.  Skin: Skin is warm and dry. No rash noted. She is not diaphoretic. No erythema. No pallor.  Psychiatric: She has a normal mood and affect. Her behavior is normal. Judgment and thought content normal.          Assessment & Plan:

## 2010-10-01 NOTE — Assessment & Plan Note (Signed)
She is doing well on lipitor 

## 2010-10-26 ENCOUNTER — Other Ambulatory Visit: Payer: Self-pay | Admitting: Obstetrics and Gynecology

## 2010-10-26 DIAGNOSIS — Z1231 Encounter for screening mammogram for malignant neoplasm of breast: Secondary | ICD-10-CM

## 2010-12-07 ENCOUNTER — Ambulatory Visit
Admission: RE | Admit: 2010-12-07 | Discharge: 2010-12-07 | Disposition: A | Discharge status: Home / Self Care | Payer: 59 | Source: Ambulatory Visit | Attending: Obstetrics and Gynecology | Admitting: Obstetrics and Gynecology

## 2010-12-07 DIAGNOSIS — Z1231 Encounter for screening mammogram for malignant neoplasm of breast: Secondary | ICD-10-CM

## 2011-02-08 ENCOUNTER — Other Ambulatory Visit: Payer: Self-pay | Admitting: Internal Medicine

## 2011-02-08 NOTE — Telephone Encounter (Signed)
Rx is called in to pharmacy

## 2011-06-01 ENCOUNTER — Other Ambulatory Visit: Payer: Self-pay | Admitting: Internal Medicine

## 2011-10-03 ENCOUNTER — Encounter: Payer: 59 | Admitting: Internal Medicine

## 2011-10-04 ENCOUNTER — Ambulatory Visit (INDEPENDENT_AMBULATORY_CARE_PROVIDER_SITE_OTHER): Payer: 59 | Admitting: Internal Medicine

## 2011-10-04 ENCOUNTER — Encounter: Payer: Self-pay | Admitting: Internal Medicine

## 2011-10-04 ENCOUNTER — Other Ambulatory Visit (INDEPENDENT_AMBULATORY_CARE_PROVIDER_SITE_OTHER): Payer: 59

## 2011-10-04 VITALS — BP 112/68 | HR 61 | Temp 98.2°F | Resp 16 | Wt 139.5 lb

## 2011-10-04 DIAGNOSIS — Z0000 Encounter for general adult medical examination without abnormal findings: Secondary | ICD-10-CM

## 2011-10-04 DIAGNOSIS — R9431 Abnormal electrocardiogram [ECG] [EKG]: Secondary | ICD-10-CM | POA: Insufficient documentation

## 2011-10-04 DIAGNOSIS — M899 Disorder of bone, unspecified: Secondary | ICD-10-CM

## 2011-10-04 DIAGNOSIS — I2510 Atherosclerotic heart disease of native coronary artery without angina pectoris: Secondary | ICD-10-CM

## 2011-10-04 DIAGNOSIS — E785 Hyperlipidemia, unspecified: Secondary | ICD-10-CM

## 2011-10-04 DIAGNOSIS — M8580 Other specified disorders of bone density and structure, unspecified site: Secondary | ICD-10-CM

## 2011-10-04 LAB — URINALYSIS, ROUTINE W REFLEX MICROSCOPIC
Ketones, ur: NEGATIVE
Specific Gravity, Urine: 1.02 (ref 1.000–1.030)
Total Protein, Urine: NEGATIVE
Urine Glucose: NEGATIVE
Urobilinogen, UA: 0.2 (ref 0.0–1.0)
pH: 5.5 (ref 5.0–8.0)

## 2011-10-04 LAB — CBC WITH DIFFERENTIAL/PLATELET
Basophils Absolute: 0.1 10*3/uL (ref 0.0–0.1)
Basophils Relative: 1 % (ref 0.0–3.0)
HCT: 39.4 % (ref 36.0–46.0)
Hemoglobin: 13.3 g/dL (ref 12.0–15.0)
Lymphocytes Relative: 17.5 % (ref 12.0–46.0)
Lymphs Abs: 1.2 10*3/uL (ref 0.7–4.0)
Monocytes Relative: 5.2 % (ref 3.0–12.0)
Neutro Abs: 5 10*3/uL (ref 1.4–7.7)
RBC: 4.18 Mil/uL (ref 3.87–5.11)
RDW: 13.3 % (ref 11.5–14.6)

## 2011-10-04 LAB — COMPREHENSIVE METABOLIC PANEL
Albumin: 4.3 g/dL (ref 3.5–5.2)
BUN: 9 mg/dL (ref 6–23)
Calcium: 9.5 mg/dL (ref 8.4–10.5)
Chloride: 107 mEq/L (ref 96–112)
Glucose, Bld: 92 mg/dL (ref 70–99)
Potassium: 4.4 mEq/L (ref 3.5–5.1)
Sodium: 142 mEq/L (ref 135–145)
Total Protein: 6.8 g/dL (ref 6.0–8.3)

## 2011-10-04 LAB — CARDIAC PANEL: CK-MB: 2.7 ng/mL (ref 0.3–4.0)

## 2011-10-04 LAB — LIPID PANEL
Cholesterol: 159 mg/dL (ref 0–200)
Total CHOL/HDL Ratio: 2
Triglycerides: 47 mg/dL (ref 0.0–149.0)

## 2011-10-04 NOTE — Assessment & Plan Note (Signed)
Labs today and cardiology referral

## 2011-10-04 NOTE — Progress Notes (Signed)
  Subjective:    Patient ID: [name], female    DOB: 1946-02-02, 66 y.o.   MRN: [id number]  Heart Problem This is a chronic problem. The current episode started more than 1 year ago. The problem has been unchanged. Pertinent negatives include no abdominal pain, anorexia, arthralgias, change in bowel habit, chest pain, chills, congestion, coughing, diaphoresis, fatigue, fever, headaches, joint swelling, myalgias, nausea, neck pain, numbness, rash, sore throat, swollen glands, urinary symptoms, vertigo, visual change, vomiting or weakness. Nothing aggravates the symptoms.      Review of Systems  Constitutional: Negative for fever, chills, diaphoresis, activity change, appetite change, fatigue and unexpected weight change.  HENT: Negative.  Negative for congestion, sore throat and neck pain.   Eyes: Negative.   Respiratory: Negative for apnea, cough, choking, chest tightness, shortness of breath, wheezing and stridor.   Cardiovascular: Negative for chest pain, palpitations and leg swelling.  Gastrointestinal: Negative for nausea, vomiting, abdominal pain, diarrhea, constipation, abdominal distention, anorexia and change in bowel habit.  Genitourinary: Negative.   Musculoskeletal: Negative for myalgias, back pain, joint swelling, arthralgias and gait problem.  Skin: Negative.  Negative for rash.  Neurological: Negative.  Negative for dizziness, vertigo, syncope, speech difficulty, weakness, numbness and headaches.  Hematological: Negative for adenopathy. Does not bruise/bleed easily.  Psychiatric/Behavioral: Negative.        Objective:   Physical Exam  Vitals reviewed. Constitutional: She is oriented to person, place, and time. She appears well-developed and well-nourished. No distress.  HENT:  Head: Normocephalic and atraumatic.  Mouth/Throat: Oropharynx is clear and moist. No oropharyngeal exudate.  Eyes: Conjunctivae are normal. Right eye exhibits no discharge. Left eye exhibits  no discharge. No scleral icterus.  Neck: Normal range of motion. Neck supple. No JVD present. No tracheal deviation present. No thyromegaly present.  Cardiovascular: Normal rate, regular rhythm, normal heart sounds and intact distal pulses.  Exam reveals no gallop and no friction rub.   No murmur heard. Pulmonary/Chest: Effort normal and breath sounds normal. No stridor. No respiratory distress. She has no wheezes. She has no rales. She exhibits no tenderness.  Abdominal: Soft. Bowel sounds are normal. She exhibits no distension and no mass. There is no tenderness. There is no rebound and no guarding.  Musculoskeletal: Normal range of motion. She exhibits no edema and no tenderness.  Lymphadenopathy:    She has no cervical adenopathy.  Neurological: She is oriented to person, place, and time.  Skin: Skin is warm and dry. No rash noted. She is not diaphoretic. No erythema. No pallor.  Psychiatric: She has a normal mood and affect. Her behavior is normal. Judgment and thought content normal.      Lab Results  Component Value Date   WBC 7.9 09/30/2010   HGB 14.1 09/30/2010   HCT 41.0 09/30/2010   PLT 176.0 09/30/2010   GLUCOSE 84 09/30/2010   CHOL 158 07/05/2010   TRIG 62.0 07/05/2010   HDL 76.20 07/05/2010   LDLCALC 69 07/05/2010   ALT 20 09/30/2010   AST 22 09/30/2010   NA 138 09/30/2010   K 4.3 09/30/2010   CL 98 09/30/2010   CREATININE 0.7 09/30/2010   BUN 14 09/30/2010   CO2 30 09/30/2010   TSH 2.94 07/05/2010      Assessment & Plan:

## 2011-10-04 NOTE — Assessment & Plan Note (Signed)
She is doing well on lipitor, I will check her FLP CMP TSH today

## 2011-10-04 NOTE — Assessment & Plan Note (Signed)
Exam done, labs ordered, pt ed material was given 

## 2011-10-04 NOTE — Patient Instructions (Signed)
Preventive Care for Adults, Female A healthy lifestyle and preventive care can promote health and wellness. Preventive health guidelines for women include the following key practices.  A routine yearly physical is a good way to check with your caregiver about your health and preventive screening. It is a chance to share any concerns and updates on your health, and to receive a thorough exam.   Visit your dentist for a routine exam and preventive care every 6 months. Brush your teeth twice a day and floss once a day. Good oral hygiene prevents tooth decay and gum disease.   The frequency of eye exams is based on your age, health, family medical history, use of contact lenses, and other factors. Follow your caregiver's recommendations for frequency of eye exams.   Eat a healthy diet. Foods like vegetables, fruits, whole grains, low-fat dairy products, and lean protein foods contain the nutrients you need without too many calories. Decrease your intake of foods high in solid fats, added sugars, and salt. Eat the right amount of calories for you.Get information about a proper diet from your caregiver, if necessary.   Regular physical exercise is one of the most important things you can do for your health. Most adults should get at least 150 minutes of moderate-intensity exercise (any activity that increases your heart rate and causes you to sweat) each week. In addition, most adults need muscle-strengthening exercises on 2 or more days a week.   Maintain a healthy weight. The body mass index (BMI) is a screening tool to identify possible weight problems. It provides an estimate of body fat based on height and weight. Your caregiver can help determine your BMI, and can help you achieve or maintain a healthy weight.For adults 20 years and older:   A BMI below 18.5 is considered underweight.   A BMI of 18.5 to 24.9 is normal.   A BMI of 25 to 29.9 is considered overweight.   A BMI of 30 and above is  considered obese.   Maintain normal blood lipids and cholesterol levels by exercising and minimizing your intake of saturated fat. Eat a balanced diet with plenty of fruit and vegetables. Blood tests for lipids and cholesterol should begin at age 20 and be repeated every 5 years. If your lipid or cholesterol levels are high, you are over 50, or you are at high risk for heart disease, you may need your cholesterol levels checked more frequently.Ongoing high lipid and cholesterol levels should be treated with medicines if diet and exercise are not effective.   If you smoke, find out from your caregiver how to quit. If you do not use tobacco, do not start.   If you are pregnant, do not drink alcohol. If you are breastfeeding, be very cautious about drinking alcohol. If you are not pregnant and choose to drink alcohol, do not exceed 1 drink per day. One drink is considered to be 12 ounces (355 mL) of beer, 5 ounces (148 mL) of wine, or 1.5 ounces (44 mL) of liquor.   Avoid use of street drugs. Do not share needles with anyone. Ask for help if you need support or instructions about stopping the use of drugs.   High blood pressure causes heart disease and increases the risk of stroke. Your blood pressure should be checked at least every 1 to 2 years. Ongoing high blood pressure should be treated with medicines if weight loss and exercise are not effective.   If you are 55 to 66   years old, ask your caregiver if you should take aspirin to prevent strokes.   Diabetes screening involves taking a blood sample to check your fasting blood sugar level. This should be done once every 3 years, after age 45, if you are within normal weight and without risk factors for diabetes. Testing should be considered at a younger age or be carried out more frequently if you are overweight and have at least 1 risk factor for diabetes.   Breast cancer screening is essential preventive care for women. You should practice "breast  self-awareness." This means understanding the normal appearance and feel of your breasts and may include breast self-examination. Any changes detected, no matter how small, should be reported to a caregiver. Women in their 20s and 30s should have a clinical breast exam (CBE) by a caregiver as part of a regular health exam every 1 to 3 years. After age 40, women should have a CBE every year. Starting at age 40, women should consider having a mammography (breast X-ray test) every year. Women who have a family history of breast cancer should talk to their caregiver about genetic screening. Women at a high risk of breast cancer should talk to their caregivers about having magnetic resonance imaging (MRI) and a mammography every year.   The Pap test is a screening test for cervical cancer. A Pap test can show cell changes on the cervix that might become cervical cancer if left untreated. A Pap test is a procedure in which cells are obtained and examined from the lower end of the uterus (cervix).   Women should have a Pap test starting at age 21.   Between ages 21 and 29, Pap tests should be repeated every 2 years.   Beginning at age 30, you should have a Pap test every 3 years as long as the past 3 Pap tests have been normal.   Some women have medical problems that increase the chance of getting cervical cancer. Talk to your caregiver about these problems. It is especially important to talk to your caregiver if a new problem develops soon after your last Pap test. In these cases, your caregiver may recommend more frequent screening and Pap tests.   The above recommendations are the same for women who have or have not gotten the vaccine for human papillomavirus (HPV).   If you had a hysterectomy for a problem that was not cancer or a condition that could lead to cancer, then you no longer need Pap tests. Even if you no longer need a Pap test, a regular exam is a good idea to make sure no other problems are  starting.   If you are between ages 65 and 70, and you have had normal Pap tests going back 10 years, you no longer need Pap tests. Even if you no longer need a Pap test, a regular exam is a good idea to make sure no other problems are starting.   If you have had past treatment for cervical cancer or a condition that could lead to cancer, you need Pap tests and screening for cancer for at least 20 years after your treatment.   If Pap tests have been discontinued, risk factors (such as a new sexual partner) need to be reassessed to determine if screening should be resumed.   The HPV test is an additional test that may be used for cervical cancer screening. The HPV test looks for the virus that can cause the cell changes on the cervix.   The cells collected during the Pap test can be tested for HPV. The HPV test could be used to screen women aged 30 years and older, and should be used in women of any age who have unclear Pap test results. After the age of 30, women should have HPV testing at the same frequency as a Pap test.   Colorectal cancer can be detected and often prevented. Most routine colorectal cancer screening begins at the age of 50 and continues through age 75. However, your caregiver may recommend screening at an earlier age if you have risk factors for colon cancer. On a yearly basis, your caregiver may provide home test kits to check for hidden blood in the stool. Use of a small camera at the end of a tube, to directly examine the colon (sigmoidoscopy or colonoscopy), can detect the earliest forms of colorectal cancer. Talk to your caregiver about this at age 50, when routine screening begins. Direct examination of the colon should be repeated every 5 to 10 years through age 75, unless early forms of pre-cancerous polyps or small growths are found.   Hepatitis C blood testing is recommended for all people born from 1945 through 1965 and any individual with known risks for hepatitis C.    Practice safe sex. Use condoms and avoid high-risk sexual practices to reduce the spread of sexually transmitted infections (STIs). STIs include gonorrhea, chlamydia, syphilis, trichomonas, herpes, HPV, and human immunodeficiency virus (HIV). Herpes, HIV, and HPV are viral illnesses that have no cure. They can result in disability, cancer, and death. Sexually active women aged 25 and younger should be checked for chlamydia. Older women with new or multiple partners should also be tested for chlamydia. Testing for other STIs is recommended if you are sexually active and at increased risk.   Osteoporosis is a disease in which the bones lose minerals and strength with aging. This can result in serious bone fractures. The risk of osteoporosis can be identified using a bone density scan. Women ages 65 and over and women at risk for fractures or osteoporosis should discuss screening with their caregivers. Ask your caregiver whether you should take a calcium supplement or vitamin D to reduce the rate of osteoporosis.   Menopause can be associated with physical symptoms and risks. Hormone replacement therapy is available to decrease symptoms and risks. You should talk to your caregiver about whether hormone replacement therapy is right for you.   Use sunscreen with sun protection factor (SPF) of 30 or more. Apply sunscreen liberally and repeatedly throughout the day. You should seek shade when your shadow is shorter than you. Protect yourself by wearing long sleeves, pants, a wide-brimmed hat, and sunglasses year round, whenever you are outdoors.   Once a month, do a whole body skin exam, using a mirror to look at the skin on your back. Notify your caregiver of new moles, moles that have irregular borders, moles that are larger than a pencil eraser, or moles that have changed in shape or color.   Stay current with required immunizations.   Influenza. You need a dose every fall (or winter). The composition of  the flu vaccine changes each year, so being vaccinated once is not enough.   Pneumococcal polysaccharide. You need 1 to 2 doses if you smoke cigarettes or if you have certain chronic medical conditions. You need 1 dose at age 65 (or older) if you have never been vaccinated.   Tetanus, diphtheria, pertussis (Tdap, Td). Get 1 dose of   Tdap vaccine if you are younger than age 65, are over 65 and have contact with an infant, are a healthcare worker, are pregnant, or simply want to be protected from whooping cough. After that, you need a Td booster dose every 10 years. Consult your caregiver if you have not had at least 3 tetanus and diphtheria-containing shots sometime in your life or have a deep or dirty wound.   HPV. You need this vaccine if you are a woman age 26 or younger. The vaccine is given in 3 doses over 6 months.   Measles, mumps, rubella (MMR). You need at least 1 dose of MMR if you were born in 1957 or later. You may also need a second dose.   Meningococcal. If you are age 19 to 21 and a first-year college student living in a residence hall, or have one of several medical conditions, you need to get vaccinated against meningococcal disease. You may also need additional booster doses.   Zoster (shingles). If you are age 60 or older, you should get this vaccine.   Varicella (chickenpox). If you have never had chickenpox or you were vaccinated but received only 1 dose, talk to your caregiver to find out if you need this vaccine.   Hepatitis A. You need this vaccine if you have a specific risk factor for hepatitis A virus infection or you simply wish to be protected from this disease. The vaccine is usually given as 2 doses, 6 to 18 months apart.   Hepatitis B. You need this vaccine if you have a specific risk factor for hepatitis B virus infection or you simply wish to be protected from this disease. The vaccine is given in 3 doses, usually over 6 months.  Preventive Services /  Frequency Ages 19 to 39  Blood pressure check.** / Every 1 to 2 years.   Lipid and cholesterol check.** / Every 5 years beginning at age 20.   Clinical breast exam.** / Every 3 years for women in their 20s and 30s.   Pap test.** / Every 2 years from ages 21 through 29. Every 3 years starting at age 30 through age 65 or 70 with a history of 3 consecutive normal Pap tests.   HPV screening.** / Every 3 years from ages 30 through ages 65 to 70 with a history of 3 consecutive normal Pap tests.   Hepatitis C blood test.** / For any individual with known risks for hepatitis C.   Skin self-exam. / Monthly.   Influenza immunization.** / Every year.   Pneumococcal polysaccharide immunization.** / 1 to 2 doses if you smoke cigarettes or if you have certain chronic medical conditions.   Tetanus, diphtheria, pertussis (Tdap, Td) immunization. / A one-time dose of Tdap vaccine. After that, you need a Td booster dose every 10 years.   HPV immunization. / 3 doses over 6 months, if you are 26 and younger.   Measles, mumps, rubella (MMR) immunization. / You need at least 1 dose of MMR if you were born in 1957 or later. You may also need a second dose.   Meningococcal immunization. / 1 dose if you are age 19 to 21 and a first-year college student living in a residence hall, or have one of several medical conditions, you need to get vaccinated against meningococcal disease. You may also need additional booster doses.   Varicella immunization.** / Consult your caregiver.   Hepatitis A immunization.** / Consult your caregiver. 2 doses, 6 to 18 months   apart.   Hepatitis B immunization.** / Consult your caregiver. 3 doses usually over 6 months.  Ages 40 to 64  Blood pressure check.** / Every 1 to 2 years.   Lipid and cholesterol check.** / Every 5 years beginning at age 20.   Clinical breast exam.** / Every year after age 40.   Mammogram.** / Every year beginning at age 40 and continuing for as  long as you are in good health. Consult with your caregiver.   Pap test.** / Every 3 years starting at age 30 through age 65 or 70 with a history of 3 consecutive normal Pap tests.   HPV screening.** / Every 3 years from ages 30 through ages 65 to 70 with a history of 3 consecutive normal Pap tests.   Fecal occult blood test (FOBT) of stool. / Every year beginning at age 50 and continuing until age 75. You may not need to do this test if you get a colonoscopy every 10 years.   Flexible sigmoidoscopy or colonoscopy.** / Every 5 years for a flexible sigmoidoscopy or every 10 years for a colonoscopy beginning at age 50 and continuing until age 75.   Hepatitis C blood test.** / For all people born from 1945 through 1965 and any individual with known risks for hepatitis C.   Skin self-exam. / Monthly.   Influenza immunization.** / Every year.   Pneumococcal polysaccharide immunization.** / 1 to 2 doses if you smoke cigarettes or if you have certain chronic medical conditions.   Tetanus, diphtheria, pertussis (Tdap, Td) immunization.** / A one-time dose of Tdap vaccine. After that, you need a Td booster dose every 10 years.   Measles, mumps, rubella (MMR) immunization. / You need at least 1 dose of MMR if you were born in 1957 or later. You may also need a second dose.   Varicella immunization.** / Consult your caregiver.   Meningococcal immunization.** / Consult your caregiver.   Hepatitis A immunization.** / Consult your caregiver. 2 doses, 6 to 18 months apart.   Hepatitis B immunization.** / Consult your caregiver. 3 doses, usually over 6 months.  Ages 65 and over  Blood pressure check.** / Every 1 to 2 years.   Lipid and cholesterol check.** / Every 5 years beginning at age 20.   Clinical breast exam.** / Every year after age 40.   Mammogram.** / Every year beginning at age 40 and continuing for as long as you are in good health. Consult with your caregiver.   Pap test.** /  Every 3 years starting at age 30 through age 65 or 70 with a 3 consecutive normal Pap tests. Testing can be stopped between 65 and 70 with 3 consecutive normal Pap tests and no abnormal Pap or HPV tests in the past 10 years.   HPV screening.** / Every 3 years from ages 30 through ages 65 or 70 with a history of 3 consecutive normal Pap tests. Testing can be stopped between 65 and 70 with 3 consecutive normal Pap tests and no abnormal Pap or HPV tests in the past 10 years.   Fecal occult blood test (FOBT) of stool. / Every year beginning at age 50 and continuing until age 75. You may not need to do this test if you get a colonoscopy every 10 years.   Flexible sigmoidoscopy or colonoscopy.** / Every 5 years for a flexible sigmoidoscopy or every 10 years for a colonoscopy beginning at age 50 and continuing until age 75.   Hepatitis   C blood test.** / For all people born from 1945 through 1965 and any individual with known risks for hepatitis C.   Osteoporosis screening.** / A one-time screening for women ages 65 and over and women at risk for fractures or osteoporosis.   Skin self-exam. / Monthly.   Influenza immunization.** / Every year.   Pneumococcal polysaccharide immunization.** / 1 dose at age 65 (or older) if you have never been vaccinated.   Tetanus, diphtheria, pertussis (Tdap, Td) immunization. / A one-time dose of Tdap vaccine if you are over 65 and have contact with an infant, are a healthcare worker, or simply want to be protected from whooping cough. After that, you need a Td booster dose every 10 years.   Varicella immunization.** / Consult your caregiver.   Meningococcal immunization.** / Consult your caregiver.   Hepatitis A immunization.** / Consult your caregiver. 2 doses, 6 to 18 months apart.   Hepatitis B immunization.** / Check with your caregiver. 3 doses, usually over 6 months.  ** Family history and personal history of risk and conditions may change your caregiver's  recommendations. Document Released: 04/26/2001 Document Revised: 02/17/2011 Document Reviewed: 07/26/2010 [employer] Patient Information 2012 [employer]. 

## 2011-10-04 NOTE — Assessment & Plan Note (Addendum)
She has no s/s, her EKG today shows loss of voltage and inverted T's in V1 and V2 so I will check her cardiac enzymes to look for ischemia and I have asked her to get a f/up appt with Dr. Daleen Squibb

## 2011-10-04 NOTE — Assessment & Plan Note (Signed)
Check labs including a Vit D level and ordered an updated DEXA scan

## 2011-10-07 ENCOUNTER — Encounter: Payer: Self-pay | Admitting: Internal Medicine

## 2011-10-07 LAB — VITAMIN D 1,25 DIHYDROXY
Vitamin D 1, 25 (OH)2 Total: 63 pg/mL (ref 18–72)
Vitamin D3 1, 25 (OH)2: 63 pg/mL

## 2011-10-10 ENCOUNTER — Other Ambulatory Visit: Payer: Self-pay | Admitting: Internal Medicine

## 2011-10-31 ENCOUNTER — Institutional Professional Consult (permissible substitution): Payer: 59 | Admitting: Internal Medicine

## 2011-11-04 ENCOUNTER — Institutional Professional Consult (permissible substitution): Payer: 59 | Admitting: Internal Medicine

## 2011-11-25 ENCOUNTER — Ambulatory Visit (INDEPENDENT_AMBULATORY_CARE_PROVIDER_SITE_OTHER): Payer: 59 | Admitting: Internal Medicine

## 2011-11-25 ENCOUNTER — Encounter: Payer: Self-pay | Admitting: Internal Medicine

## 2011-11-25 VITALS — BP 140/70 | HR 60 | Ht 64.0 in | Wt 136.0 lb

## 2011-11-25 DIAGNOSIS — E785 Hyperlipidemia, unspecified: Secondary | ICD-10-CM

## 2011-11-25 DIAGNOSIS — I25810 Atherosclerosis of coronary artery bypass graft(s) without angina pectoris: Secondary | ICD-10-CM

## 2011-11-25 MED ORDER — NITROGLYCERIN 0.4 MG SL SUBL: 0.4000 mg | SUBLINGUAL_TABLET | SUBLINGUAL | Status: DC | PRN

## 2011-11-25 NOTE — Progress Notes (Signed)
HPI Patient is a 66 year old who is referred back to cardiology for continued care The patient has a history of CAD  She is s/p CABG in 2005 (LIMA to LAD; SVG to PDA; SVG to Diag).  Also a history of tob use, HTN.   She had a myoview in 2006 that showed normal perfusion Since seen she has continued to do well from a cardiac standpoint. She is active.  Denies CP  Breathing is OK  No change in ability to do things.   No Known Allergies  Current Outpatient Prescriptions  Medication Sig Dispense Refill  . aspirin 325 MG tablet Take 325 mg by mouth daily.        Marland Kitchen atorvastatin (LIPITOR) 40 MG tablet TAKE 1 TABLET BY MOUTH ONCE DAILY  90 tablet  1  . metoprolol tartrate (LOPRESSOR) 25 MG tablet TAKE 1/2 TABLET BY MOUTH TWICE DAILY  90 tablet  0    Past Medical History  Diagnosis Date  . Cervical cancer     Dr Ambrose Mantle  . CAD (coronary artery disease)   . Hyperlipidemia     Past Surgical History  Procedure Date  . Coronary artery bypass graft     Family History  Problem Relation Age of Onset  . Diabetes Other   . Hypertension Other   . Coronary artery disease Other   . Cancer Neg Hx   . Stroke Neg Hx   . Hyperlipidemia Neg Hx     History   Social History  . Marital Status: Married    Spouse Name: N/A    Number of Children: N/A  . Years of Education: N/A   Occupational History  . Scheduler at [employer]   Social History Main Topics  . Smoking status: Former Smoker    Quit date: 03/14/1985  . Smokeless tobacco: Not on file  . Alcohol Use: No  . Drug Use: No  . Sexually Active: Not Currently   Other Topics Concern  . Not on file   Social History Narrative   Regular Exercise -  YES    Review of Systems:  All systems reviewed.  They are negative to the above problem except as previously stated.  Vital Signs: BP 140/70  Pulse 60  Ht 5\' 4"  (1.626 m)  Wt 136 lb (61.689 kg)  BMI 23.34 kg/m2  Physical Exam Patient is in NAD> HEENT:  Normocephalic,  atraumatic. EOMI, PERRLA.  Neck: JVP is normal.  No bruits.  Lungs: clear to auscultation. No rales no wheezes.  Heart: Regular rate and rhythm. Normal S1, S2. No S3.   No significant murmurs. PMI not displaced.  Abdomen:  Supple, nontender. Normal bowel sounds. No masses. No hepatomegaly.  Extremities:   Good distal pulses throughout. No lower extremity edema.  Musculoskeletal :moving all extremities.  Neuro:   alert and oriented x3.  CN II-XII grossly intact.  EKG:  SB  52 bpm.  Assessment and Plan:  1.  CAD  Patient is asymptomatic.  Keep on same regimen  Stay activie.  2.  HL  Very good control on current regimen with lipitor.  COntinue.  WIll follow up in 18 months.  Sooner if problems develop.

## 2011-12-08 ENCOUNTER — Other Ambulatory Visit: Payer: Self-pay | Admitting: Obstetrics and Gynecology

## 2011-12-08 DIAGNOSIS — Z1231 Encounter for screening mammogram for malignant neoplasm of breast: Secondary | ICD-10-CM

## 2011-12-15 ENCOUNTER — Other Ambulatory Visit: Payer: Self-pay | Admitting: Internal Medicine

## 2011-12-22 ENCOUNTER — Ambulatory Visit
Admission: RE | Admit: 2011-12-22 | Discharge: 2011-12-22 | Disposition: A | Discharge status: Home / Self Care | Payer: 59 | Source: Ambulatory Visit | Attending: Obstetrics and Gynecology | Admitting: Obstetrics and Gynecology

## 2011-12-22 DIAGNOSIS — Z1231 Encounter for screening mammogram for malignant neoplasm of breast: Secondary | ICD-10-CM

## 2012-01-03 ENCOUNTER — Ambulatory Visit: Payer: 59 | Admitting: Internal Medicine

## 2012-01-03 DIAGNOSIS — Z0289 Encounter for other administrative examinations: Secondary | ICD-10-CM

## 2012-06-03 IMAGING — CR DG HAND COMPLETE 3+V*R*
3 series · 3 of 3 positions shown · non-contrast
Comparison: None.

CLINICAL DATA: Pain and swelling of the third digit and metacarpal
phalangeal joint.

RIGHT HAND - COMPLETE 3+ VIEW

[view not recorded (1 of 3)]
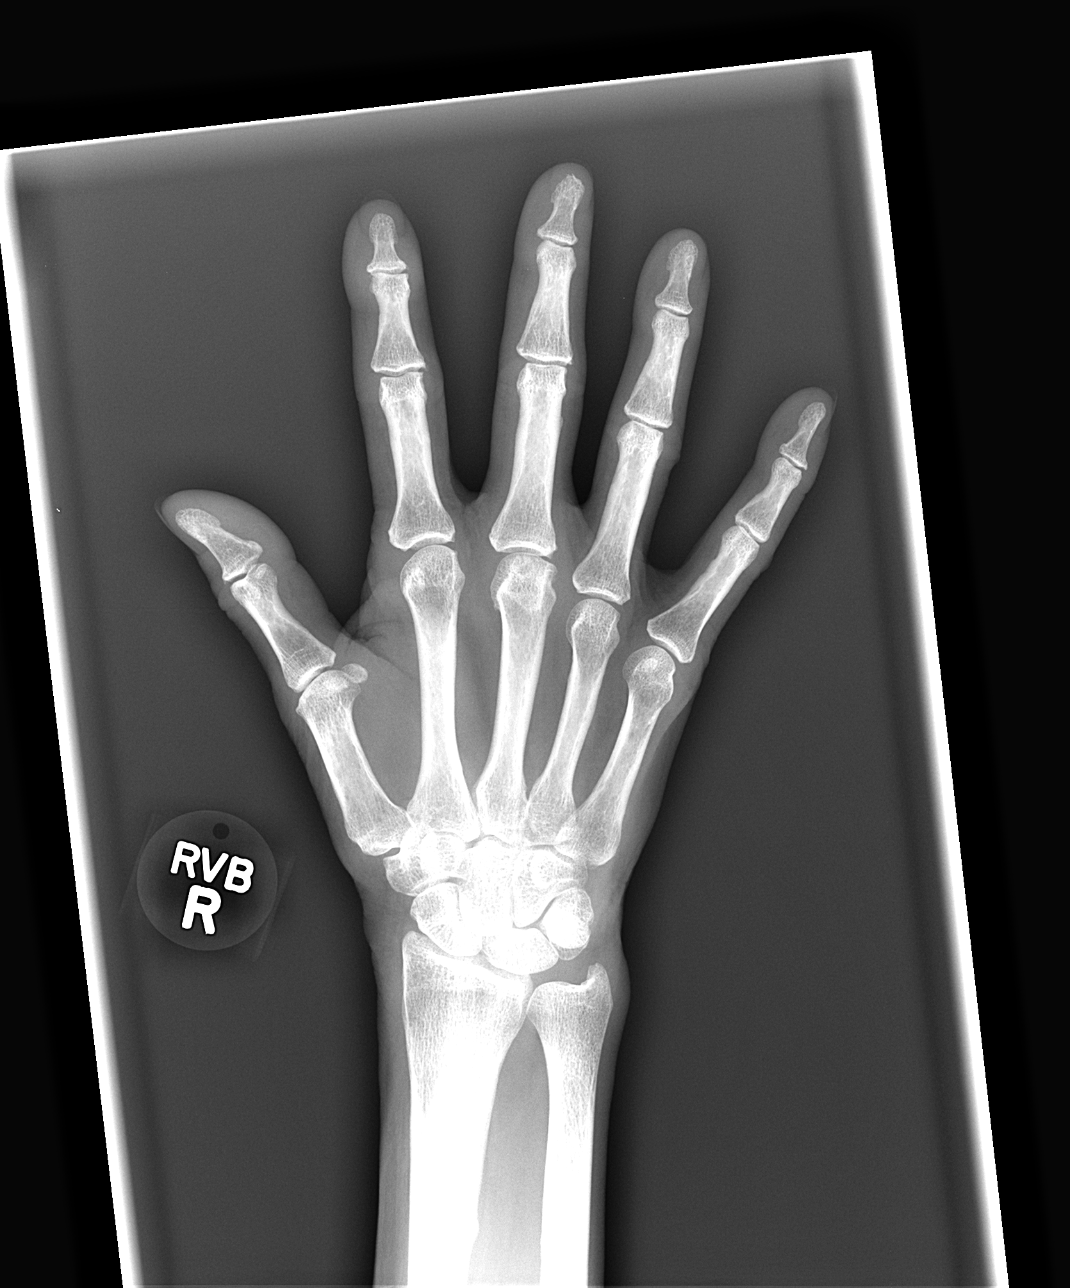

[view not recorded (2 of 3)]
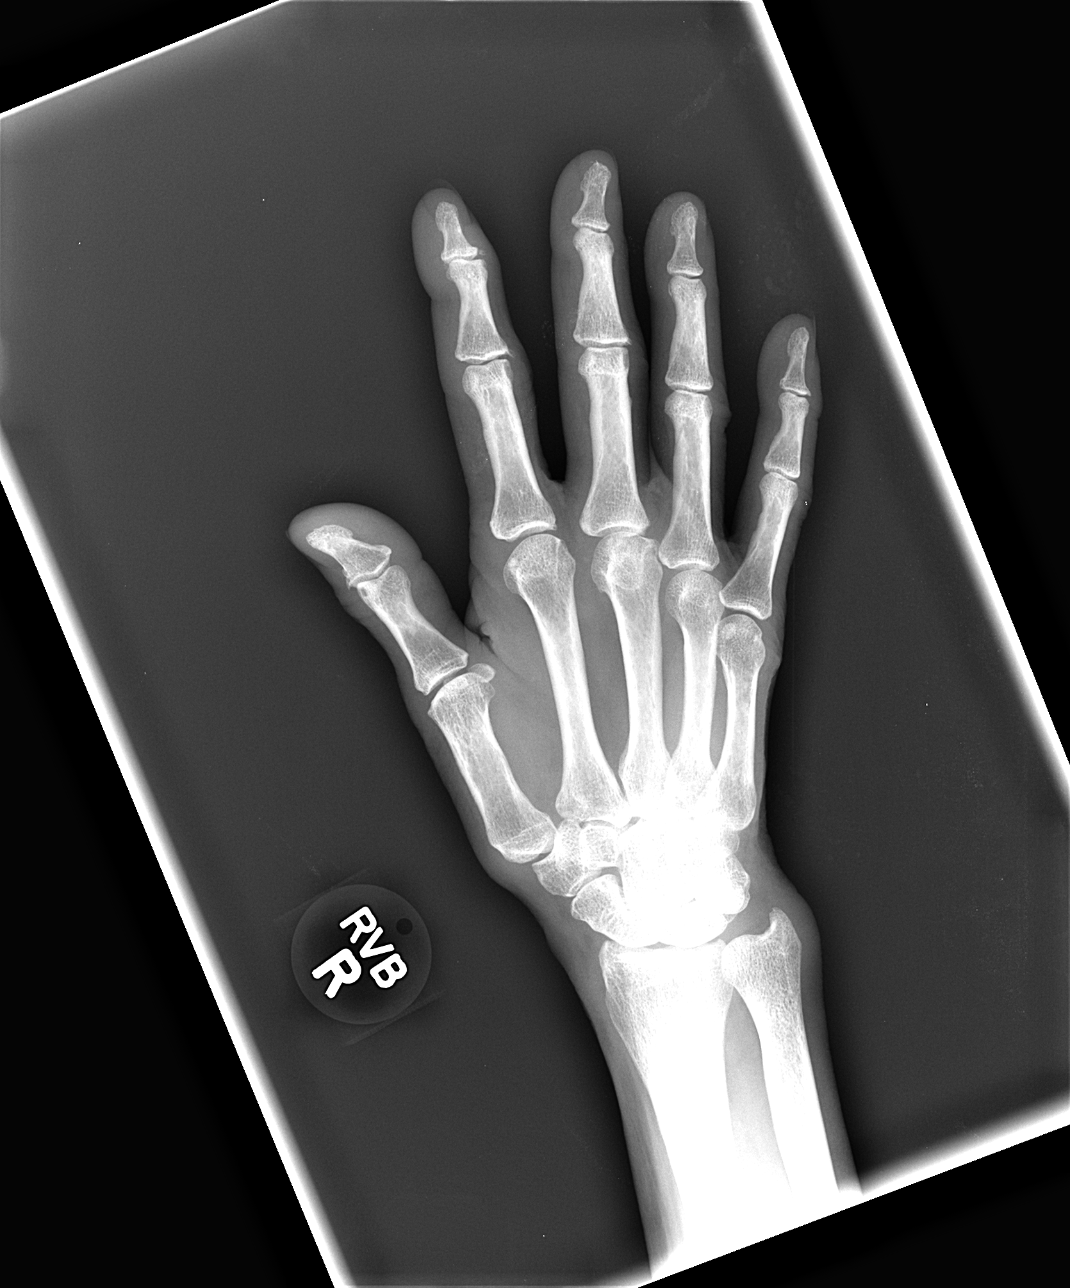

[view not recorded (3 of 3)]
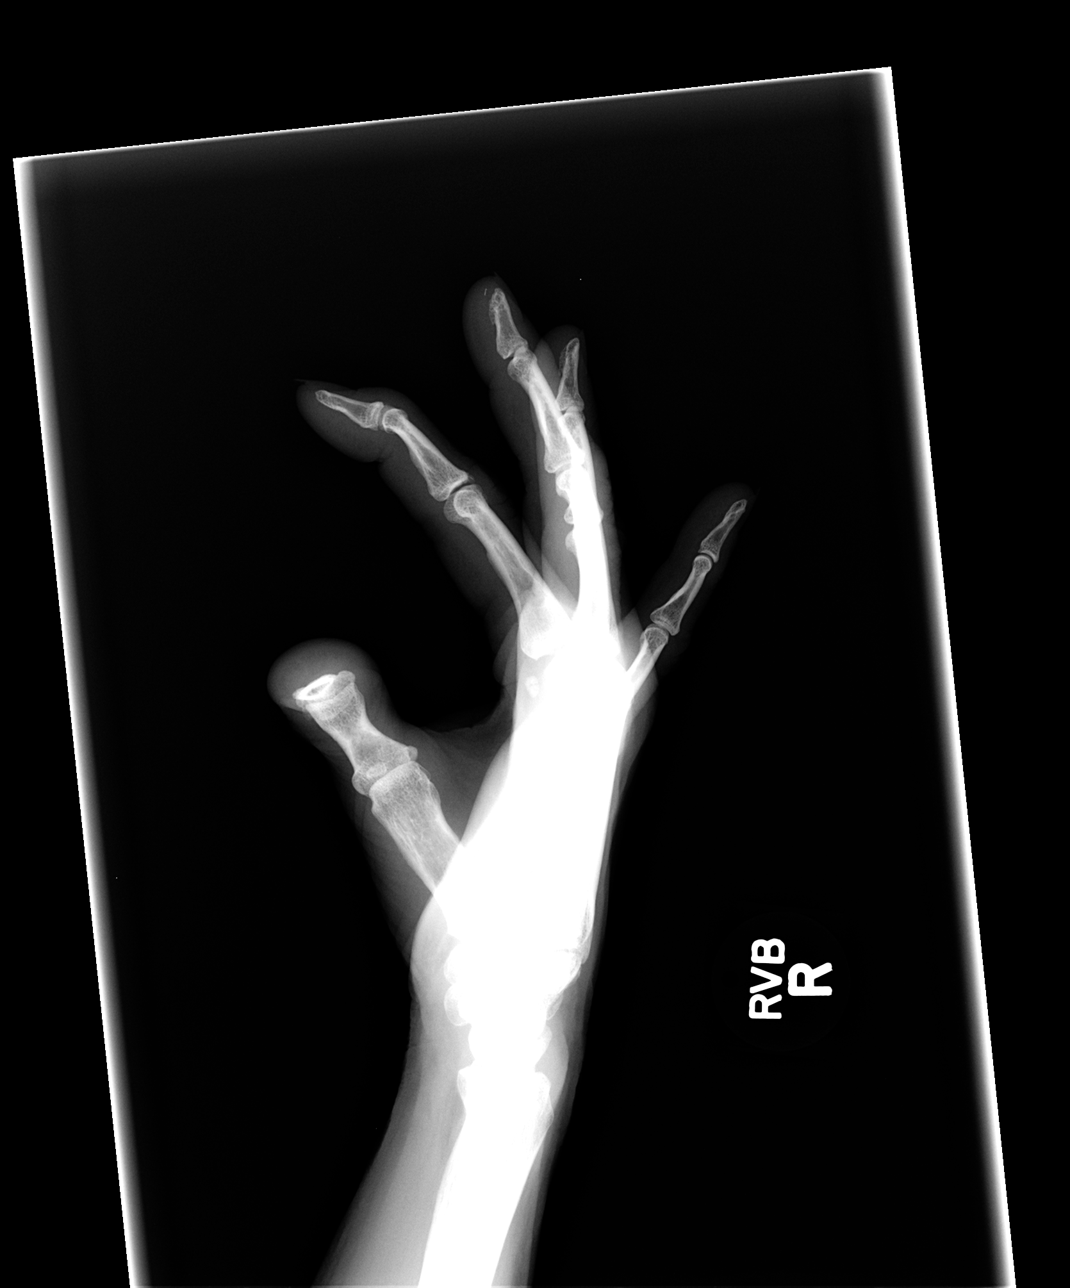

[3 of 3 positions shown; findings below may reference images not displayed]

FINDINGS: There is moderate osteoarthritis of the head of the third
metacarpal.

The patient also has mild osteoarthritis of the interphalangeal
joints of the fingers.  There is mild osteoarthritis at the first
and second metacarpal phalangeal joints.

There is a tiny radiodense foreign body in the soft tissues of the
tip of the long finger.
IMPRESSION: Osteoarthritis primarily at the third metacarpal phalangeal joint.

## 2012-06-21 ENCOUNTER — Other Ambulatory Visit: Payer: Self-pay | Admitting: Internal Medicine

## 2012-07-18 ENCOUNTER — Other Ambulatory Visit: Payer: Self-pay | Admitting: Internal Medicine

## 2012-09-19 ENCOUNTER — Other Ambulatory Visit: Payer: Self-pay | Admitting: Internal Medicine

## 2012-10-17 ENCOUNTER — Encounter: Payer: 59 | Admitting: Internal Medicine

## 2012-10-25 ENCOUNTER — Ambulatory Visit (INDEPENDENT_AMBULATORY_CARE_PROVIDER_SITE_OTHER): Payer: 59 | Admitting: Internal Medicine

## 2012-10-25 ENCOUNTER — Encounter: Payer: Self-pay | Admitting: Internal Medicine

## 2012-10-25 ENCOUNTER — Other Ambulatory Visit (INDEPENDENT_AMBULATORY_CARE_PROVIDER_SITE_OTHER): Payer: 59

## 2012-10-25 VITALS — BP 136/70 | HR 54 | Temp 98.6°F | Resp 16 | Wt 141.0 lb

## 2012-10-25 DIAGNOSIS — M8580 Other specified disorders of bone density and structure, unspecified site: Secondary | ICD-10-CM

## 2012-10-25 DIAGNOSIS — M899 Disorder of bone, unspecified: Secondary | ICD-10-CM

## 2012-10-25 DIAGNOSIS — E785 Hyperlipidemia, unspecified: Secondary | ICD-10-CM

## 2012-10-25 DIAGNOSIS — Z23 Encounter for immunization: Secondary | ICD-10-CM

## 2012-10-25 DIAGNOSIS — Z0000 Encounter for general adult medical examination without abnormal findings: Secondary | ICD-10-CM

## 2012-10-25 LAB — COMPREHENSIVE METABOLIC PANEL
Albumin: 4.3 g/dL (ref 3.5–5.2)
Alkaline Phosphatase: 87 U/L (ref 39–117)
Calcium: 9.8 mg/dL (ref 8.4–10.5)
Chloride: 106 mEq/L (ref 96–112)
Glucose, Bld: 94 mg/dL (ref 70–99)
Potassium: 4.5 mEq/L (ref 3.5–5.1)
Sodium: 140 mEq/L (ref 135–145)
Total Protein: 7.3 g/dL (ref 6.0–8.3)

## 2012-10-25 LAB — CBC WITH DIFFERENTIAL/PLATELET
Eosinophils Absolute: 0.4 10*3/uL (ref 0.0–0.7)
Eosinophils Relative: 5.3 % — ABNORMAL HIGH (ref 0.0–5.0)
Lymphocytes Relative: 19.1 % (ref 12.0–46.0)
MCHC: 33.6 g/dL (ref 30.0–36.0)
MCV: 95.4 fl (ref 78.0–100.0)
Monocytes Absolute: 0.5 10*3/uL (ref 0.1–1.0)
Neutrophils Relative %: 67.9 % (ref 43.0–77.0)
Platelets: 186 10*3/uL (ref 150.0–400.0)
WBC: 7 10*3/uL (ref 4.5–10.5)

## 2012-10-25 LAB — LIPID PANEL
Cholesterol: 163 mg/dL (ref 0–200)
Total CHOL/HDL Ratio: 2
Triglycerides: 71 mg/dL (ref 0.0–149.0)

## 2012-10-25 NOTE — Progress Notes (Signed)
  Subjective:    Patient ID: [name], female    DOB: 16-Apr-1945, 67 y.o.   MRN: [id number]  Hyperlipidemia This is a chronic problem. The current episode started more than 1 year ago. The problem is controlled. Recent lipid tests were reviewed and are variable. She has no history of chronic renal disease, diabetes, hypothyroidism, liver disease, obesity or nephrotic syndrome. There are no known factors aggravating her hyperlipidemia. Pertinent negatives include no chest pain, focal sensory loss, focal weakness, leg pain, myalgias or shortness of breath. Current antihyperlipidemic treatment includes statins. The current treatment provides significant improvement of lipids. There are no compliance problems.  Risk factors for coronary artery disease include post-menopausal.      Review of Systems  Constitutional: Negative.  Negative for fever, chills, diaphoresis, activity change, appetite change, fatigue and unexpected weight change.  HENT: Negative.   Eyes: Negative.   Respiratory: Negative.  Negative for apnea, cough, choking, chest tightness, shortness of breath, wheezing and stridor.   Cardiovascular: Negative.  Negative for chest pain, palpitations and leg swelling.  Gastrointestinal: Negative.  Negative for nausea, vomiting, abdominal pain, diarrhea, constipation and blood in stool.  Endocrine: Negative.   Genitourinary: Negative.   Musculoskeletal: Negative.  Negative for myalgias, back pain, joint swelling, arthralgias and gait problem.  Skin: Negative.   Allergic/Immunologic: Negative.   Neurological: Negative.  Negative for dizziness, focal weakness, weakness, light-headedness and numbness.  Hematological: Negative.  Negative for adenopathy. Does not bruise/bleed easily.  Psychiatric/Behavioral: Negative.        Objective:   Physical Exam  Vitals reviewed. Constitutional: She is oriented to person, place, and time. She appears well-developed and well-nourished. No  distress.  HENT:  Head: Normocephalic and atraumatic.  Mouth/Throat: Oropharynx is clear and moist. No oropharyngeal exudate.  Eyes: Conjunctivae are normal. Right eye exhibits no discharge. Left eye exhibits no discharge. No scleral icterus.  Neck: Normal range of motion. Neck supple. No JVD present. No tracheal deviation present. No thyromegaly present.  Cardiovascular: Normal rate, regular rhythm, normal heart sounds and intact distal pulses.  Exam reveals no gallop and no friction rub.   No murmur heard. Pulmonary/Chest: Effort normal and breath sounds normal. No stridor. No respiratory distress. She has no wheezes. She has no rales. She exhibits no tenderness.  Abdominal: Soft. Bowel sounds are normal. She exhibits no distension and no mass. There is no tenderness. There is no rebound and no guarding.  Musculoskeletal: Normal range of motion. She exhibits no edema and no tenderness.  Lymphadenopathy:    She has no cervical adenopathy.  Neurological: She is oriented to person, place, and time.  Skin: Skin is warm and dry. No rash noted. She is not diaphoretic. No erythema. No pallor.  Psychiatric: She has a normal mood and affect. Her behavior is normal. Judgment and thought content normal.     Lab Results  Component Value Date   WBC 7.0 10/04/2011   HGB 13.3 10/04/2011   HCT 39.4 10/04/2011   PLT 167.0 10/04/2011   GLUCOSE 92 10/04/2011   CHOL 159 10/04/2011   TRIG 47.0 10/04/2011   HDL 86.30 10/04/2011   LDLCALC 63 10/04/2011   ALT 24 10/04/2011   AST 23 10/04/2011   NA 142 10/04/2011   K 4.4 10/04/2011   CL 107 10/04/2011   CREATININE 0.7 10/04/2011   BUN 9 10/04/2011   CO2 30 10/04/2011   TSH 1.68 10/04/2011       Assessment & Plan:

## 2012-10-25 NOTE — Patient Instructions (Signed)
Preventive Care for Adults, Female A healthy lifestyle and preventive care can promote health and wellness. Preventive health guidelines for women include the following key practices.  A routine yearly physical is a good way to check with your caregiver about your health and preventive screening. It is a chance to share any concerns and updates on your health, and to receive a thorough exam.  Visit your dentist for a routine exam and preventive care every 6 months. Brush your teeth twice a day and floss once a day. Good oral hygiene prevents tooth decay and gum disease.  The frequency of eye exams is based on your age, health, family medical history, use of contact lenses, and other factors. Follow your caregiver's recommendations for frequency of eye exams.  Eat a healthy diet. Foods like vegetables, fruits, whole grains, low-fat dairy products, and lean protein foods contain the nutrients you need without too many calories. Decrease your intake of foods high in solid fats, added sugars, and salt. Eat the right amount of calories for you.Get information about a proper diet from your caregiver, if necessary.  Regular physical exercise is one of the most important things you can do for your health. Most adults should get at least 150 minutes of moderate-intensity exercise (any activity that increases your heart rate and causes you to sweat) each week. In addition, most adults need muscle-strengthening exercises on 2 or more days a week.  Maintain a healthy weight. The body mass index (BMI) is a screening tool to identify possible weight problems. It provides an estimate of body fat based on height and weight. Your caregiver can help determine your BMI, and can help you achieve or maintain a healthy weight.For adults 20 years and older:  A BMI below 18.5 is considered underweight.  A BMI of 18.5 to 24.9 is normal.  A BMI of 25 to 29.9 is considered overweight.  A BMI of 30 and above is  considered obese.  Maintain normal blood lipids and cholesterol levels by exercising and minimizing your intake of saturated fat. Eat a balanced diet with plenty of fruit and vegetables. Blood tests for lipids and cholesterol should begin at age 20 and be repeated every 5 years. If your lipid or cholesterol levels are high, you are over 50, or you are at high risk for heart disease, you may need your cholesterol levels checked more frequently.Ongoing high lipid and cholesterol levels should be treated with medicines if diet and exercise are not effective.  If you smoke, find out from your caregiver how to quit. If you do not use tobacco, do not start.  If you are pregnant, do not drink alcohol. If you are breastfeeding, be very cautious about drinking alcohol. If you are not pregnant and choose to drink alcohol, do not exceed 1 drink per day. One drink is considered to be 12 ounces (355 mL) of beer, 5 ounces (148 mL) of wine, or 1.5 ounces (44 mL) of liquor.  Avoid use of street drugs. Do not share needles with anyone. Ask for help if you need support or instructions about stopping the use of drugs.  High blood pressure causes heart disease and increases the risk of stroke. Your blood pressure should be checked at least every 1 to 2 years. Ongoing high blood pressure should be treated with medicines if weight loss and exercise are not effective.  If you are 55 to 67 years old, ask your caregiver if you should take aspirin to prevent strokes.  Diabetes   screening involves taking a blood sample to check your fasting blood sugar level. This should be done once every 3 years, after age 45, if you are within normal weight and without risk factors for diabetes. Testing should be considered at a younger age or be carried out more frequently if you are overweight and have at least 1 risk factor for diabetes.  Breast cancer screening is essential preventive care for women. You should practice "breast  self-awareness." This means understanding the normal appearance and feel of your breasts and may include breast self-examination. Any changes detected, no matter how small, should be reported to a caregiver. Women in their 20s and 30s should have a clinical breast exam (CBE) by a caregiver as part of a regular health exam every 1 to 3 years. After age 40, women should have a CBE every year. Starting at age 40, women should consider having a mammography (breast X-ray test) every year. Women who have a family history of breast cancer should talk to their caregiver about genetic screening. Women at a high risk of breast cancer should talk to their caregivers about having magnetic resonance imaging (MRI) and a mammography every year.  The Pap test is a screening test for cervical cancer. A Pap test can show cell changes on the cervix that might become cervical cancer if left untreated. A Pap test is a procedure in which cells are obtained and examined from the lower end of the uterus (cervix).  Women should have a Pap test starting at age 21.  Between ages 21 and 29, Pap tests should be repeated every 2 years.  Beginning at age 30, you should have a Pap test every 3 years as long as the past 3 Pap tests have been normal.  Some women have medical problems that increase the chance of getting cervical cancer. Talk to your caregiver about these problems. It is especially important to talk to your caregiver if a new problem develops soon after your last Pap test. In these cases, your caregiver may recommend more frequent screening and Pap tests.  The above recommendations are the same for women who have or have not gotten the vaccine for human papillomavirus (HPV).  If you had a hysterectomy for a problem that was not cancer or a condition that could lead to cancer, then you no longer need Pap tests. Even if you no longer need a Pap test, a regular exam is a good idea to make sure no other problems are  starting.  If you are between ages 65 and 70, and you have had normal Pap tests going back 10 years, you no longer need Pap tests. Even if you no longer need a Pap test, a regular exam is a good idea to make sure no other problems are starting.  If you have had past treatment for cervical cancer or a condition that could lead to cancer, you need Pap tests and screening for cancer for at least 20 years after your treatment.  If Pap tests have been discontinued, risk factors (such as a new sexual partner) need to be reassessed to determine if screening should be resumed.  The HPV test is an additional test that may be used for cervical cancer screening. The HPV test looks for the virus that can cause the cell changes on the cervix. The cells collected during the Pap test can be tested for HPV. The HPV test could be used to screen women aged 30 years and older, and should   be used in women of any age who have unclear Pap test results. After the age of 30, women should have HPV testing at the same frequency as a Pap test.  Colorectal cancer can be detected and often prevented. Most routine colorectal cancer screening begins at the age of 50 and continues through age 75. However, your caregiver may recommend screening at an earlier age if you have risk factors for colon cancer. On a yearly basis, your caregiver may provide home test kits to check for hidden blood in the stool. Use of a small camera at the end of a tube, to directly examine the colon (sigmoidoscopy or colonoscopy), can detect the earliest forms of colorectal cancer. Talk to your caregiver about this at age 50, when routine screening begins. Direct examination of the colon should be repeated every 5 to 10 years through age 75, unless early forms of pre-cancerous polyps or small growths are found.  Hepatitis C blood testing is recommended for all people born from 1945 through 1965 and any individual with known risks for hepatitis C.  Practice  safe sex. Use condoms and avoid high-risk sexual practices to reduce the spread of sexually transmitted infections (STIs). STIs include gonorrhea, chlamydia, syphilis, trichomonas, herpes, HPV, and human immunodeficiency virus (HIV). Herpes, HIV, and HPV are viral illnesses that have no cure. They can result in disability, cancer, and death. Sexually active women aged 25 and younger should be checked for chlamydia. Older women with new or multiple partners should also be tested for chlamydia. Testing for other STIs is recommended if you are sexually active and at increased risk.  Osteoporosis is a disease in which the bones lose minerals and strength with aging. This can result in serious bone fractures. The risk of osteoporosis can be identified using a bone density scan. Women ages 65 and over and women at risk for fractures or osteoporosis should discuss screening with their caregivers. Ask your caregiver whether you should take a calcium supplement or vitamin D to reduce the rate of osteoporosis.  Menopause can be associated with physical symptoms and risks. Hormone replacement therapy is available to decrease symptoms and risks. You should talk to your caregiver about whether hormone replacement therapy is right for you.  Use sunscreen with sun protection factor (SPF) of 30 or more. Apply sunscreen liberally and repeatedly throughout the day. You should seek shade when your shadow is shorter than you. Protect yourself by wearing long sleeves, pants, a wide-brimmed hat, and sunglasses year round, whenever you are outdoors.  Once a month, do a whole body skin exam, using a mirror to look at the skin on your back. Notify your caregiver of new moles, moles that have irregular borders, moles that are larger than a pencil eraser, or moles that have changed in shape or color.  Stay current with required immunizations.  Influenza. You need a dose every fall (or winter). The composition of the flu vaccine  changes each year, so being vaccinated once is not enough.  Pneumococcal polysaccharide. You need 1 to 2 doses if you smoke cigarettes or if you have certain chronic medical conditions. You need 1 dose at age 65 (or older) if you have never been vaccinated.  Tetanus, diphtheria, pertussis (Tdap, Td). Get 1 dose of Tdap vaccine if you are younger than age 65, are over 65 and have contact with an infant, are a healthcare worker, are pregnant, or simply want to be protected from whooping cough. After that, you need a Td   booster dose every 10 years. Consult your caregiver if you have not had at least 3 tetanus and diphtheria-containing shots sometime in your life or have a deep or dirty wound.  HPV. You need this vaccine if you are a woman age 26 or younger. The vaccine is given in 3 doses over 6 months.  Measles, mumps, rubella (MMR). You need at least 1 dose of MMR if you were born in 1957 or later. You may also need a second dose.  Meningococcal. If you are age 19 to 21 and a first-year college student living in a residence hall, or have one of several medical conditions, you need to get vaccinated against meningococcal disease. You may also need additional booster doses.  Zoster (shingles). If you are age 60 or older, you should get this vaccine.  Varicella (chickenpox). If you have never had chickenpox or you were vaccinated but received only 1 dose, talk to your caregiver to find out if you need this vaccine.  Hepatitis A. You need this vaccine if you have a specific risk factor for hepatitis A virus infection or you simply wish to be protected from this disease. The vaccine is usually given as 2 doses, 6 to 18 months apart.  Hepatitis B. You need this vaccine if you have a specific risk factor for hepatitis B virus infection or you simply wish to be protected from this disease. The vaccine is given in 3 doses, usually over 6 months. Preventive Services / Frequency Ages 19 to 39  Blood  pressure check.** / Every 1 to 2 years.  Lipid and cholesterol check.** / Every 5 years beginning at age 20.  Clinical breast exam.** / Every 3 years for women in their 20s and 30s.  Pap test.** / Every 2 years from ages 21 through 29. Every 3 years starting at age 30 through age 65 or 70 with a history of 3 consecutive normal Pap tests.  HPV screening.** / Every 3 years from ages 30 through ages 65 to 70 with a history of 3 consecutive normal Pap tests.  Hepatitis C blood test.** / For any individual with known risks for hepatitis C.  Skin self-exam. / Monthly.  Influenza immunization.** / Every year.  Pneumococcal polysaccharide immunization.** / 1 to 2 doses if you smoke cigarettes or if you have certain chronic medical conditions.  Tetanus, diphtheria, pertussis (Tdap, Td) immunization. / A one-time dose of Tdap vaccine. After that, you need a Td booster dose every 10 years.  HPV immunization. / 3 doses over 6 months, if you are 26 and younger.  Measles, mumps, rubella (MMR) immunization. / You need at least 1 dose of MMR if you were born in 1957 or later. You may also need a second dose.  Meningococcal immunization. / 1 dose if you are age 19 to 21 and a first-year college student living in a residence hall, or have one of several medical conditions, you need to get vaccinated against meningococcal disease. You may also need additional booster doses.  Varicella immunization.** / Consult your caregiver.  Hepatitis A immunization.** / Consult your caregiver. 2 doses, 6 to 18 months apart.  Hepatitis B immunization.** / Consult your caregiver. 3 doses usually over 6 months. Ages 40 to 64  Blood pressure check.** / Every 1 to 2 years.  Lipid and cholesterol check.** / Every 5 years beginning at age 20.  Clinical breast exam.** / Every year after age 40.  Mammogram.** / Every year beginning at age 40   and continuing for as long as you are in good health. Consult with your  caregiver.  Pap test.** / Every 3 years starting at age 30 through age 65 or 70 with a history of 3 consecutive normal Pap tests.  HPV screening.** / Every 3 years from ages 30 through ages 65 to 70 with a history of 3 consecutive normal Pap tests.  Fecal occult blood test (FOBT) of stool. / Every year beginning at age 50 and continuing until age 75. You may not need to do this test if you get a colonoscopy every 10 years.  Flexible sigmoidoscopy or colonoscopy.** / Every 5 years for a flexible sigmoidoscopy or every 10 years for a colonoscopy beginning at age 50 and continuing until age 75.  Hepatitis C blood test.** / For all people born from 1945 through 1965 and any individual with known risks for hepatitis C.  Skin self-exam. / Monthly.  Influenza immunization.** / Every year.  Pneumococcal polysaccharide immunization.** / 1 to 2 doses if you smoke cigarettes or if you have certain chronic medical conditions.  Tetanus, diphtheria, pertussis (Tdap, Td) immunization.** / A one-time dose of Tdap vaccine. After that, you need a Td booster dose every 10 years.  Measles, mumps, rubella (MMR) immunization. / You need at least 1 dose of MMR if you were born in 1957 or later. You may also need a second dose.  Varicella immunization.** / Consult your caregiver.  Meningococcal immunization.** / Consult your caregiver.  Hepatitis A immunization.** / Consult your caregiver. 2 doses, 6 to 18 months apart.  Hepatitis B immunization.** / Consult your caregiver. 3 doses, usually over 6 months. Ages 65 and over  Blood pressure check.** / Every 1 to 2 years.  Lipid and cholesterol check.** / Every 5 years beginning at age 20.  Clinical breast exam.** / Every year after age 40.  Mammogram.** / Every year beginning at age 40 and continuing for as long as you are in good health. Consult with your caregiver.  Pap test.** / Every 3 years starting at age 30 through age 65 or 70 with a 3  consecutive normal Pap tests. Testing can be stopped between 65 and 70 with 3 consecutive normal Pap tests and no abnormal Pap or HPV tests in the past 10 years.  HPV screening.** / Every 3 years from ages 30 through ages 65 or 70 with a history of 3 consecutive normal Pap tests. Testing can be stopped between 65 and 70 with 3 consecutive normal Pap tests and no abnormal Pap or HPV tests in the past 10 years.  Fecal occult blood test (FOBT) of stool. / Every year beginning at age 50 and continuing until age 75. You may not need to do this test if you get a colonoscopy every 10 years.  Flexible sigmoidoscopy or colonoscopy.** / Every 5 years for a flexible sigmoidoscopy or every 10 years for a colonoscopy beginning at age 50 and continuing until age 75.  Hepatitis C blood test.** / For all people born from 1945 through 1965 and any individual with known risks for hepatitis C.  Osteoporosis screening.** / A one-time screening for women ages 65 and over and women at risk for fractures or osteoporosis.  Skin self-exam. / Monthly.  Influenza immunization.** / Every year.  Pneumococcal polysaccharide immunization.** / 1 dose at age 65 (or older) if you have never been vaccinated.  Tetanus, diphtheria, pertussis (Tdap, Td) immunization. / A one-time dose of Tdap vaccine if you are over   65 and have contact with an infant, are a healthcare worker, or simply want to be protected from whooping cough. After that, you need a Td booster dose every 10 years.  Varicella immunization.** / Consult your caregiver.  Meningococcal immunization.** / Consult your caregiver.  Hepatitis A immunization.** / Consult your caregiver. 2 doses, 6 to 18 months apart.  Hepatitis B immunization.** / Check with your caregiver. 3 doses, usually over 6 months. ** Family history and personal history of risk and conditions may change your caregiver's recommendations. Document Released: 04/26/2001 Document Revised: 05/23/2011  Document Reviewed: 07/26/2010 [employer] Patient Information 2014 [employer].  

## 2012-10-26 ENCOUNTER — Encounter: Payer: Self-pay | Admitting: Internal Medicine

## 2012-10-26 NOTE — Assessment & Plan Note (Signed)
Exam done Vaccines were updated Labs ordered Pt ed material was given 

## 2012-10-26 NOTE — Assessment & Plan Note (Signed)
Goal achieved 

## 2012-10-26 NOTE — Assessment & Plan Note (Signed)
Vit D level is ok I have asked her to get a DEXA scan done

## 2012-12-25 ENCOUNTER — Other Ambulatory Visit: Payer: Self-pay

## 2012-12-25 DIAGNOSIS — Z1231 Encounter for screening mammogram for malignant neoplasm of breast: Secondary | ICD-10-CM

## 2013-01-10 ENCOUNTER — Ambulatory Visit
Admission: RE | Admit: 2013-01-10 | Discharge: 2013-01-10 | Disposition: A | Discharge status: Home / Self Care | Payer: 59 | Source: Ambulatory Visit

## 2013-01-10 DIAGNOSIS — Z1231 Encounter for screening mammogram for malignant neoplasm of breast: Secondary | ICD-10-CM

## 2013-05-27 ENCOUNTER — Encounter: Payer: Self-pay | Admitting: Internal Medicine

## 2013-05-27 ENCOUNTER — Ambulatory Visit (INDEPENDENT_AMBULATORY_CARE_PROVIDER_SITE_OTHER): Payer: 59 | Admitting: Internal Medicine

## 2013-05-27 VITALS — BP 158/72 | HR 68 | Ht 64.0 in | Wt 143.0 lb

## 2013-05-27 DIAGNOSIS — I2510 Atherosclerotic heart disease of native coronary artery without angina pectoris: Secondary | ICD-10-CM

## 2013-05-27 NOTE — Patient Instructions (Signed)
Your physician recommends that you continue on your current medications as directed. Please refer to the Current Medication list given to you today. Your physician wants you to follow-up in: August 2015 with Dr. Harrington Challenger.   You will receive a reminder letter in the mail two months in advance. If you don't receive a letter, please call our office to schedule the follow-up appointment.  Your physician wants you to keep a record of your blood pressures. Try to check your pressure about the same time of day, each time. Bring the record to your next visit with Dr. Harrington Challenger.

## 2013-05-27 NOTE — Progress Notes (Signed)
HPI Patient is a 68 year old who is referred back to cardiology for continued care The patient has a history of CAD  She is s/p CABG in 2005 (LIMA to LAD; SVG to PDA; SVG to Diag).  Also a history of tob use, HTN.   She had a myoview in 2006 that showed normal perfusion  I saw her in clinic in Sept2013  Since seen she has done well  NO CP  No sob  Active  nNo Known Allergies  Current Outpatient Prescriptions  Medication Sig Dispense Refill  . aspirin 325 MG tablet Take 325 mg by mouth daily.        Marland Kitchen atorvastatin (LIPITOR) 40 MG tablet TAKE 1 TABLET BY MOUTH ONCE DAILY  90 tablet  3  . metoprolol tartrate (LOPRESSOR) 25 MG tablet TAKE 1/2 TABLET BY MOUTH TWICE DAILY  90 tablet  3  . nitroGLYCERIN (NITROSTAT) 0.4 MG SL tablet Place 0.4 mg under the tongue every 5 (five) minutes as needed for chest pain.       No current facility-administered medications for this visit.    Past Medical History  Diagnosis Date  . Cervical cancer     Dr Ulanda Edison  . CAD (coronary artery disease)   . Hyperlipidemia     Past Surgical History  Procedure Laterality Date  . Coronary artery bypass graft      Family History  Problem Relation Age of Onset  . Diabetes Other   . Hypertension Other   . Coronary artery disease Other   . Cancer Neg Hx   . Stroke Neg Hx   . Hyperlipidemia Neg Hx     History   Social History  . Marital Status: Married    Spouse Name: N/A    Number of Children: N/A  . Years of Education: N/A   Occupational History  . Scheduler at [employer] History Main Topics  . Smoking status: Former Smoker    Quit date: 03/14/1985  . Smokeless tobacco: Not on file  . Alcohol Use: No  . Drug Use: No  . Sexual Activity: Not Currently   Other Topics Concern  . Not on file   Social History Narrative   Regular Exercise -  YES    Review of Systems:  All systems reviewed.  They are negative to the above problem except as previously stated.  Vital Signs: BP  158/72  Pulse 68  Ht 5\' 4"  (1.626 m)  Wt 143 lb (64.864 kg)  BMI 24.53 kg/m2 BP on my check 166/80 Physical Exam Patient is in NAD> HEENT:  Normocephalic, atraumatic. EOMI, PERRLA.  Neck: JVP is normal.  No bruits.  Lungs: clear to auscultation. No rales no wheezes.  Heart: Regular rate and rhythm. Normal S1, S2. No S3.   No significant murmurs. PMI not displaced.  Abdomen:  Supple, nontender. Normal bowel sounds. No masses. No hepatomegaly.  Extremities:   Good distal pulses throughout. No lower extremity edema.  Musculoskeletal :moving all extremities.  Neuro:   alert and oriented x3.  CN II-XII grossly intact.  EKG:  SR 63 bpm  Assessment and Plan: 1.  CAD  No active symptoms  2.  HTN  BP is elevated today  I have asked her to check BP over next few months  Keep log  F/U in Aug  Call if sustained HTN  Will rx sooner  3.  HL  Excellent control.

## 2013-08-20 ENCOUNTER — Other Ambulatory Visit: Payer: Self-pay | Admitting: Internal Medicine

## 2013-09-26 ENCOUNTER — Other Ambulatory Visit: Payer: Self-pay | Admitting: Internal Medicine

## 2013-10-27 NOTE — Progress Notes (Signed)
HPI Patient is a 68 year old who is referred back to cardiology for continued care The patient has a history of CAD  She is s/p CABG in 2005 (LIMA to LAD; SVG to PDA; SVG to Diag).  Also a history of tob use, HTN.   She had a myoview in 2006 that showed normal perfusion  I saw her in clinic in march  Recomm that she follow her BP    Range from 101 to 166/  AVg appear to be 140/ Denies CP  Breathing is OK    nNo Known Allergies  Current Outpatient Prescriptions  Medication Sig Dispense Refill  . aspirin 325 MG tablet Take 325 mg by mouth daily.        Marland Kitchen atorvastatin (LIPITOR) 40 MG tablet TAKE 1 TABLET BY MOUTH ONCE DAILY  90 tablet  3  . metoprolol tartrate (LOPRESSOR) 25 MG tablet TAKE 1/2 TABLET BY MOUTH TWICE DAILY  90 tablet  3  . nitroGLYCERIN (NITROSTAT) 0.4 MG SL tablet Place 0.4 mg under the tongue every 5 (five) minutes as needed for chest pain.       No current facility-administered medications for this visit.    Past Medical History  Diagnosis Date  . Cervical cancer     Dr Ulanda Edison  . CAD (coronary artery disease)   . Hyperlipidemia     Past Surgical History  Procedure Laterality Date  . Coronary artery bypass graft      Family History  Problem Relation Age of Onset  . Diabetes Other   . Hypertension Other   . Coronary artery disease Other   . Cancer Neg Hx   . Stroke Neg Hx   . Hyperlipidemia Neg Hx   . Heart attack Father   . Hypertension Mother     History   Social History  . Marital Status: Married    Spouse Name: N/A    Number of Children: N/A  . Years of Education: N/A   Occupational History  . Scheduler at [employer] History Main Topics  . Smoking status: Former Smoker    Quit date: 03/14/1985  . Smokeless tobacco: Not on file  . Alcohol Use: No  . Drug Use: No  . Sexual Activity: Not Currently   Other Topics Concern  . Not on file   Social History Narrative   Regular Exercise -  YES    Review of Systems:  All  systems reviewed.  They are negative to the above problem except as previously stated.  Vital Signs: BP 140/80  Pulse 69  Ht 5\' 4"  (1.626 m)  Wt 139 lb (63.05 kg)  BMI 23.85 kg/m2  Physical Exam Patient is in NAD> HEENT:  Normocephalic, atraumatic. EOMI, PERRLA.  Neck: JVP is normal.  Lungs: clear to auscultation. No rales no wheezes.  Heart: Regular rate and rhythm. Normal S1, S2. No S3.   No significant murmurs. PMI not displaced.  Abdomen:  Supple, nontender. Normal bowel sounds. No masses. No hepatomegaly.  Extremities:   Good distal pulses throughout. No lower extremity edema.  Musculoskeletal :moving all extremities.  Neuro:   alert and oriented x3.  CN II-XII grossly intact.  EKG:  SR 68 bpm   k slecjfjc S T wave changes  Assessment and Plan: 1.  CAD  No active symptoms  2.  HTN  BP is labile Low at times, high at times  OVreall average does not seem too high 3.  HL  Continue  meds

## 2013-10-28 ENCOUNTER — Encounter: Payer: Self-pay | Admitting: Internal Medicine

## 2013-10-28 ENCOUNTER — Ambulatory Visit (INDEPENDENT_AMBULATORY_CARE_PROVIDER_SITE_OTHER): Payer: 59 | Admitting: Internal Medicine

## 2013-10-28 VITALS — BP 140/80 | HR 69 | Ht 64.0 in | Wt 139.0 lb

## 2013-10-28 DIAGNOSIS — I25810 Atherosclerosis of coronary artery bypass graft(s) without angina pectoris: Secondary | ICD-10-CM

## 2013-10-28 DIAGNOSIS — I10 Essential (primary) hypertension: Secondary | ICD-10-CM

## 2013-10-28 NOTE — Patient Instructions (Signed)
Your physician recommends that you continue on your current medications as directed. Please refer to the Current Medication list given to you today. Your physician wants you to follow-up in: June 2016 with Dr. Harrington Challenger.  You will receive a reminder letter in the mail two months in advance. If you don't receive a letter, please call our office to schedule the follow-up appointment.

## 2013-10-30 ENCOUNTER — Encounter: Payer: 59 | Admitting: Internal Medicine

## 2013-11-22 ENCOUNTER — Encounter: Payer: 59 | Admitting: Internal Medicine

## 2013-12-18 ENCOUNTER — Encounter: Payer: 59 | Admitting: Internal Medicine

## 2014-01-01 ENCOUNTER — Ambulatory Visit (INDEPENDENT_AMBULATORY_CARE_PROVIDER_SITE_OTHER): Payer: 59 | Admitting: Internal Medicine

## 2014-01-01 ENCOUNTER — Encounter: Payer: Self-pay | Admitting: Internal Medicine

## 2014-01-01 ENCOUNTER — Other Ambulatory Visit (INDEPENDENT_AMBULATORY_CARE_PROVIDER_SITE_OTHER): Payer: 59

## 2014-01-01 VITALS — BP 128/72 | HR 67 | Temp 97.7°F | Resp 16 | Ht 64.0 in | Wt 139.8 lb

## 2014-01-01 DIAGNOSIS — Z23 Encounter for immunization: Secondary | ICD-10-CM

## 2014-01-01 DIAGNOSIS — I2510 Atherosclerotic heart disease of native coronary artery without angina pectoris: Secondary | ICD-10-CM

## 2014-01-01 DIAGNOSIS — Z0000 Encounter for general adult medical examination without abnormal findings: Secondary | ICD-10-CM

## 2014-01-01 LAB — CBC WITH DIFFERENTIAL/PLATELET
BASOS PCT: 0.6 % (ref 0.0–3.0)
Basophils Absolute: 0.1 10*3/uL (ref 0.0–0.1)
EOS ABS: 0.4 10*3/uL (ref 0.0–0.7)
Eosinophils Relative: 4.8 % (ref 0.0–5.0)
HCT: 40.1 % (ref 36.0–46.0)
Hemoglobin: 13.2 g/dL (ref 12.0–15.0)
Lymphocytes Relative: 21.2 % (ref 12.0–46.0)
Lymphs Abs: 1.7 10*3/uL (ref 0.7–4.0)
MCHC: 32.9 g/dL (ref 30.0–36.0)
MCV: 94.4 fl (ref 78.0–100.0)
MONO ABS: 0.5 10*3/uL (ref 0.1–1.0)
Monocytes Relative: 6.3 % (ref 3.0–12.0)
NEUTROS PCT: 67.1 % (ref 43.0–77.0)
Neutro Abs: 5.5 10*3/uL (ref 1.4–7.7)
PLATELETS: 188 10*3/uL (ref 150.0–400.0)
RBC: 4.25 Mil/uL (ref 3.87–5.11)
RDW: 12.9 % (ref 11.5–15.5)
WBC: 8.1 10*3/uL (ref 4.0–10.5)

## 2014-01-01 NOTE — Progress Notes (Signed)
Pre visit review using our clinic review tool, if applicable. No additional management support is needed unless otherwise documented below in the visit note. 

## 2014-01-01 NOTE — Patient Instructions (Signed)
Preventive Care for Adults A healthy lifestyle and preventive care can promote health and wellness. Preventive health guidelines for women include the following key practices.  A routine yearly physical is a good way to check with your health care provider about your health and preventive screening. It is a chance to share any concerns and updates on your health and to receive a thorough exam.  Visit your dentist for a routine exam and preventive care every 6 months. Brush your teeth twice a day and floss once a day. Good oral hygiene prevents tooth decay and gum disease.  The frequency of eye exams is based on your age, health, family medical history, use of contact lenses, and other factors. Follow your health care provider's recommendations for frequency of eye exams.  Eat a healthy diet. Foods like vegetables, fruits, whole grains, low-fat dairy products, and lean protein foods contain the nutrients you need without too many calories. Decrease your intake of foods high in solid fats, added sugars, and salt. Eat the right amount of calories for you.Get information about a proper diet from your health care provider, if necessary.  Regular physical exercise is one of the most important things you can do for your health. Most adults should get at least 150 minutes of moderate-intensity exercise (any activity that increases your heart rate and causes you to sweat) each week. In addition, most adults need muscle-strengthening exercises on 2 or more days a week.  Maintain a healthy weight. The body mass index (BMI) is a screening tool to identify possible weight problems. It provides an estimate of body fat based on height and weight. Your health care provider can find your BMI and can help you achieve or maintain a healthy weight.For adults 20 years and older:  A BMI below 18.5 is considered underweight.  A BMI of 18.5 to 24.9 is normal.  A BMI of 25 to 29.9 is considered overweight.  A BMI of  30 and above is considered obese.  Maintain normal blood lipids and cholesterol levels by exercising and minimizing your intake of saturated fat. Eat a balanced diet with plenty of fruit and vegetables. Blood tests for lipids and cholesterol should begin at age 76 and be repeated every 5 years. If your lipid or cholesterol levels are high, you are over 50, or you are at high risk for heart disease, you may need your cholesterol levels checked more frequently.Ongoing high lipid and cholesterol levels should be treated with medicines if diet and exercise are not working.  If you smoke, find out from your health care provider how to quit. If you do not use tobacco, do not start.  Lung cancer screening is recommended for adults aged 22-80 years who are at high risk for developing lung cancer because of a history of smoking. A yearly low-dose CT scan of the lungs is recommended for people who have at least a 30-pack-year history of smoking and are a current smoker or have quit within the past 15 years. A pack year of smoking is smoking an average of 1 pack of cigarettes a day for 1 year (for example: 1 pack a day for 30 years or 2 packs a day for 15 years). Yearly screening should continue until the smoker has stopped smoking for at least 15 years. Yearly screening should be stopped for people who develop a health problem that would prevent them from having lung cancer treatment.  If you are pregnant, do not drink alcohol. If you are breastfeeding,  be very cautious about drinking alcohol. If you are not pregnant and choose to drink alcohol, do not have more than 1 drink per day. One drink is considered to be 12 ounces (355 mL) of beer, 5 ounces (148 mL) of wine, or 1.5 ounces (44 mL) of liquor.  Avoid use of street drugs. Do not share needles with anyone. Ask for help if you need support or instructions about stopping the use of drugs.  High blood pressure causes heart disease and increases the risk of  stroke. Your blood pressure should be checked at least every 1 to 2 years. Ongoing high blood pressure should be treated with medicines if weight loss and exercise do not work.  If you are 75-52 years old, ask your health care provider if you should take aspirin to prevent strokes.  Diabetes screening involves taking a blood sample to check your fasting blood sugar level. This should be done once every 3 years, after age 15, if you are within normal weight and without risk factors for diabetes. Testing should be considered at a younger age or be carried out more frequently if you are overweight and have at least 1 risk factor for diabetes.  Breast cancer screening is essential preventive care for women. You should practice "breast self-awareness." This means understanding the normal appearance and feel of your breasts and may include breast self-examination. Any changes detected, no matter how small, should be reported to a health care provider. Women in their 58s and 30s should have a clinical breast exam (CBE) by a health care provider as part of a regular health exam every 1 to 3 years. After age 16, women should have a CBE every year. Starting at age 53, women should consider having a mammogram (breast X-ray test) every year. Women who have a family history of breast cancer should talk to their health care provider about genetic screening. Women at a high risk of breast cancer should talk to their health care providers about having an MRI and a mammogram every year.  Breast cancer gene (BRCA)-related cancer risk assessment is recommended for women who have family members with BRCA-related cancers. BRCA-related cancers include breast, ovarian, tubal, and peritoneal cancers. Having family members with these cancers may be associated with an increased risk for harmful changes (mutations) in the breast cancer genes BRCA1 and BRCA2. Results of the assessment will determine the need for genetic counseling and  BRCA1 and BRCA2 testing.  Routine pelvic exams to screen for cancer are no longer recommended for nonpregnant women who are considered low risk for cancer of the pelvic organs (ovaries, uterus, and vagina) and who do not have symptoms. Ask your health care provider if a screening pelvic exam is right for you.  If you have had past treatment for cervical cancer or a condition that could lead to cancer, you need Pap tests and screening for cancer for at least 20 years after your treatment. If Pap tests have been discontinued, your risk factors (such as having a new sexual partner) need to be reassessed to determine if screening should be resumed. Some women have medical problems that increase the chance of getting cervical cancer. In these cases, your health care provider may recommend more frequent screening and Pap tests.  The HPV test is an additional test that may be used for cervical cancer screening. The HPV test looks for the virus that can cause the cell changes on the cervix. The cells collected during the Pap test can be  tested for HPV. The HPV test could be used to screen women aged 30 years and older, and should be used in women of any age who have unclear Pap test results. After the age of 30, women should have HPV testing at the same frequency as a Pap test.  Colorectal cancer can be detected and often prevented. Most routine colorectal cancer screening begins at the age of 50 years and continues through age 75 years. However, your health care provider may recommend screening at an earlier age if you have risk factors for colon cancer. On a yearly basis, your health care provider may provide home test kits to check for hidden blood in the stool. Use of a small camera at the end of a tube, to directly examine the colon (sigmoidoscopy or colonoscopy), can detect the earliest forms of colorectal cancer. Talk to your health care provider about this at age 50, when routine screening begins. Direct  exam of the colon should be repeated every 5-10 years through age 75 years, unless early forms of pre-cancerous polyps or small growths are found.  People who are at an increased risk for hepatitis B should be screened for this virus. You are considered at high risk for hepatitis B if:  You were born in a country where hepatitis B occurs often. Talk with your health care provider about which countries are considered high risk.  Your parents were born in a high-risk country and you have not received a shot to protect against hepatitis B (hepatitis B vaccine).  You have HIV or AIDS.  You use needles to inject street drugs.  You live with, or have sex with, someone who has hepatitis B.  You get hemodialysis treatment.  You take certain medicines for conditions like cancer, organ transplantation, and autoimmune conditions.  Hepatitis C blood testing is recommended for all people born from 1945 through 1965 and any individual with known risks for hepatitis C.  Practice safe sex. Use condoms and avoid high-risk sexual practices to reduce the spread of sexually transmitted infections (STIs). STIs include gonorrhea, chlamydia, syphilis, trichomonas, herpes, HPV, and human immunodeficiency virus (HIV). Herpes, HIV, and HPV are viral illnesses that have no cure. They can result in disability, cancer, and death.  You should be screened for sexually transmitted illnesses (STIs) including gonorrhea and chlamydia if:  You are sexually active and are younger than 24 years.  You are older than 24 years and your health care provider tells you that you are at risk for this type of infection.  Your sexual activity has changed since you were last screened and you are at an increased risk for chlamydia or gonorrhea. Ask your health care provider if you are at risk.  If you are at risk of being infected with HIV, it is recommended that you take a prescription medicine daily to prevent HIV infection. This is  called preexposure prophylaxis (PrEP). You are considered at risk if:  You are a heterosexual woman, are sexually active, and are at increased risk for HIV infection.  You take drugs by injection.  You are sexually active with a partner who has HIV.  Talk with your health care provider about whether you are at high risk of being infected with HIV. If you choose to begin PrEP, you should first be tested for HIV. You should then be tested every 3 months for as long as you are taking PrEP.  Osteoporosis is a disease in which the bones lose minerals and strength   with aging. This can result in serious bone fractures or breaks. The risk of osteoporosis can be identified using a bone density scan. Women ages 65 years and over and women at risk for fractures or osteoporosis should discuss screening with their health care providers. Ask your health care provider whether you should take a calcium supplement or vitamin D to reduce the rate of osteoporosis.  Menopause can be associated with physical symptoms and risks. Hormone replacement therapy is available to decrease symptoms and risks. You should talk to your health care provider about whether hormone replacement therapy is right for you.  Use sunscreen. Apply sunscreen liberally and repeatedly throughout the day. You should seek shade when your shadow is shorter than you. Protect yourself by wearing long sleeves, pants, a wide-brimmed hat, and sunglasses year round, whenever you are outdoors.  Once a month, do a whole body skin exam, using a mirror to look at the skin on your back. Tell your health care provider of new moles, moles that have irregular borders, moles that are larger than a pencil eraser, or moles that have changed in shape or color.  Stay current with required vaccines (immunizations).  Influenza vaccine. All adults should be immunized every year.  Tetanus, diphtheria, and acellular pertussis (Td, Tdap) vaccine. Pregnant women should  receive 1 dose of Tdap vaccine during each pregnancy. The dose should be obtained regardless of the length of time since the last dose. Immunization is preferred during the 27th-36th week of gestation. An adult who has not previously received Tdap or who does not know her vaccine status should receive 1 dose of Tdap. This initial dose should be followed by tetanus and diphtheria toxoids (Td) booster doses every 10 years. Adults with an unknown or incomplete history of completing a 3-dose immunization series with Td-containing vaccines should begin or complete a primary immunization series including a Tdap dose. Adults should receive a Td booster every 10 years.  Varicella vaccine. An adult without evidence of immunity to varicella should receive 2 doses or a second dose if she has previously received 1 dose. Pregnant females who do not have evidence of immunity should receive the first dose after pregnancy. This first dose should be obtained before leaving the health care facility. The second dose should be obtained 4-8 weeks after the first dose.  Human papillomavirus (HPV) vaccine. Females aged 13-26 years who have not received the vaccine previously should obtain the 3-dose series. The vaccine is not recommended for use in pregnant females. However, pregnancy testing is not needed before receiving a dose. If a female is found to be pregnant after receiving a dose, no treatment is needed. In that case, the remaining doses should be delayed until after the pregnancy. Immunization is recommended for any person with an immunocompromised condition through the age of 26 years if she did not get any or all doses earlier. During the 3-dose series, the second dose should be obtained 4-8 weeks after the first dose. The third dose should be obtained 24 weeks after the first dose and 16 weeks after the second dose.  Zoster vaccine. One dose is recommended for adults aged 60 years or older unless certain conditions are  present.  Measles, mumps, and rubella (MMR) vaccine. Adults born before 1957 generally are considered immune to measles and mumps. Adults born in 1957 or later should have 1 or more doses of MMR vaccine unless there is a contraindication to the vaccine or there is laboratory evidence of immunity to   each of the three diseases. A routine second dose of MMR vaccine should be obtained at least 28 days after the first dose for students attending postsecondary schools, health care workers, or international travelers. People who received inactivated measles vaccine or an unknown type of measles vaccine during 1963-1967 should receive 2 doses of MMR vaccine. People who received inactivated mumps vaccine or an unknown type of mumps vaccine before 1979 and are at high risk for mumps infection should consider immunization with 2 doses of MMR vaccine. For females of childbearing age, rubella immunity should be determined. If there is no evidence of immunity, females who are not pregnant should be vaccinated. If there is no evidence of immunity, females who are pregnant should delay immunization until after pregnancy. Unvaccinated health care workers born before 1957 who lack laboratory evidence of measles, mumps, or rubella immunity or laboratory confirmation of disease should consider measles and mumps immunization with 2 doses of MMR vaccine or rubella immunization with 1 dose of MMR vaccine.  Pneumococcal 13-valent conjugate (PCV13) vaccine. When indicated, a person who is uncertain of her immunization history and has no record of immunization should receive the PCV13 vaccine. An adult aged 19 years or older who has certain medical conditions and has not been previously immunized should receive 1 dose of PCV13 vaccine. This PCV13 should be followed with a dose of pneumococcal polysaccharide (PPSV23) vaccine. The PPSV23 vaccine dose should be obtained at least 8 weeks after the dose of PCV13 vaccine. An adult aged 19  years or older who has certain medical conditions and previously received 1 or more doses of PPSV23 vaccine should receive 1 dose of PCV13. The PCV13 vaccine dose should be obtained 1 or more years after the last PPSV23 vaccine dose.  Pneumococcal polysaccharide (PPSV23) vaccine. When PCV13 is also indicated, PCV13 should be obtained first. All adults aged 65 years and older should be immunized. An adult younger than age 65 years who has certain medical conditions should be immunized. Any person who resides in a nursing home or long-term care facility should be immunized. An adult smoker should be immunized. People with an immunocompromised condition and certain other conditions should receive both PCV13 and PPSV23 vaccines. People with human immunodeficiency virus (HIV) infection should be immunized as soon as possible after diagnosis. Immunization during chemotherapy or radiation therapy should be avoided. Routine use of PPSV23 vaccine is not recommended for American Indians, [place] Natives, or people younger than 65 years unless there are medical conditions that require PPSV23 vaccine. When indicated, people who have unknown immunization and have no record of immunization should receive PPSV23 vaccine. One-time revaccination 5 years after the first dose of PPSV23 is recommended for people aged 19-64 years who have chronic kidney failure, nephrotic syndrome, asplenia, or immunocompromised conditions. People who received 1-2 doses of PPSV23 before age 65 years should receive another dose of PPSV23 vaccine at age 65 years or later if at least 5 years have passed since the previous dose. Doses of PPSV23 are not needed for people immunized with PPSV23 at or after age 65 years.  Meningococcal vaccine. Adults with asplenia or persistent complement component deficiencies should receive 2 doses of quadrivalent meningococcal conjugate (MenACWY-D) vaccine. The doses should be obtained at least 2 months apart.  Microbiologists working with certain meningococcal bacteria, military recruits, people at risk during an outbreak, and people who travel to or live in countries with a high rate of meningitis should be immunized. A first-year college student up through age   21 years who is living in a residence hall should receive a dose if she did not receive a dose on or after her 16th birthday. Adults who have certain high-risk conditions should receive one or more doses of vaccine.  Hepatitis A vaccine. Adults who wish to be protected from this disease, have certain high-risk conditions, work with hepatitis A-infected animals, work in hepatitis A research labs, or travel to or work in countries with a high rate of hepatitis A should be immunized. Adults who were previously unvaccinated and who anticipate close contact with an international adoptee during the first 60 days after arrival in the [place] from a country with a high rate of hepatitis A should be immunized.  Hepatitis B vaccine. Adults who wish to be protected from this disease, have certain high-risk conditions, may be exposed to blood or other infectious body fluids, are household contacts or sex partners of hepatitis B positive people, are clients or workers in certain care facilities, or travel to or work in countries with a high rate of hepatitis B should be immunized.  Haemophilus influenzae type b (Hib) vaccine. A previously unvaccinated person with asplenia or sickle cell disease or having a scheduled splenectomy should receive 1 dose of Hib vaccine. Regardless of previous immunization, a recipient of a hematopoietic stem cell transplant should receive a 3-dose series 6-12 months after her successful transplant. Hib vaccine is not recommended for adults with HIV infection. Preventive Services / Frequency Ages 64 to 68 years  Blood pressure check.** / Every 1 to 2 years.  Lipid and cholesterol check.** / Every 5 years beginning at age  22.  Clinical breast exam.** / Every 3 years for women in their 88s and 53s.  BRCA-related cancer risk assessment.** / For women who have family members with a BRCA-related cancer (breast, ovarian, tubal, or peritoneal cancers).  Pap test.** / Every 2 years from ages 90 through 51. Every 3 years starting at age 21 through age 56 or 3 with a history of 3 consecutive normal Pap tests.  HPV screening.** / Every 3 years from ages 24 through ages 1 to 46 with a history of 3 consecutive normal Pap tests.  Hepatitis C blood test.** / For any individual with known risks for hepatitis C.  Skin self-exam. / Monthly.  Influenza vaccine. / Every year.  Tetanus, diphtheria, and acellular pertussis (Tdap, Td) vaccine.** / Consult your health care provider. Pregnant women should receive 1 dose of Tdap vaccine during each pregnancy. 1 dose of Td every 10 years.  Varicella vaccine.** / Consult your health care provider. Pregnant females who do not have evidence of immunity should receive the first dose after pregnancy.  HPV vaccine. / 3 doses over 6 months, if 72 and younger. The vaccine is not recommended for use in pregnant females. However, pregnancy testing is not needed before receiving a dose.  Measles, mumps, rubella (MMR) vaccine.** / You need at least 1 dose of MMR if you were born in 1957 or later. You may also need a 2nd dose. For females of childbearing age, rubella immunity should be determined. If there is no evidence of immunity, females who are not pregnant should be vaccinated. If there is no evidence of immunity, females who are pregnant should delay immunization until after pregnancy.  Pneumococcal 13-valent conjugate (PCV13) vaccine.** / Consult your health care provider.  Pneumococcal polysaccharide (PPSV23) vaccine.** / 1 to 2 doses if you smoke cigarettes or if you have certain conditions.  Meningococcal vaccine.** /  1 dose if you are age 19 to 21 years and a first-year college  student living in a residence hall, or have one of several medical conditions, you need to get vaccinated against meningococcal disease. You may also need additional booster doses.  Hepatitis A vaccine.** / Consult your health care provider.  Hepatitis B vaccine.** / Consult your health care provider.  Haemophilus influenzae type b (Hib) vaccine.** / Consult your health care provider. Ages 40 to 64 years  Blood pressure check.** / Every 1 to 2 years.  Lipid and cholesterol check.** / Every 5 years beginning at age 20 years.  Lung cancer screening. / Every year if you are aged 55-80 years and have a 30-pack-year history of smoking and currently smoke or have quit within the past 15 years. Yearly screening is stopped once you have quit smoking for at least 15 years or develop a health problem that would prevent you from having lung cancer treatment.  Clinical breast exam.** / Every year after age 40 years.  BRCA-related cancer risk assessment.** / For women who have family members with a BRCA-related cancer (breast, ovarian, tubal, or peritoneal cancers).  Mammogram.** / Every year beginning at age 40 years and continuing for as long as you are in good health. Consult with your health care provider.  Pap test.** / Every 3 years starting at age 30 years through age 65 or 70 years with a history of 3 consecutive normal Pap tests.  HPV screening.** / Every 3 years from ages 30 years through ages 65 to 70 years with a history of 3 consecutive normal Pap tests.  Fecal occult blood test (FOBT) of stool. / Every year beginning at age 50 years and continuing until age 75 years. You may not need to do this test if you get a colonoscopy every 10 years.  Flexible sigmoidoscopy or colonoscopy.** / Every 5 years for a flexible sigmoidoscopy or every 10 years for a colonoscopy beginning at age 50 years and continuing until age 75 years.  Hepatitis C blood test.** / For all people born from 1945 through  1965 and any individual with known risks for hepatitis C.  Skin self-exam. / Monthly.  Influenza vaccine. / Every year.  Tetanus, diphtheria, and acellular pertussis (Tdap/Td) vaccine.** / Consult your health care provider. Pregnant women should receive 1 dose of Tdap vaccine during each pregnancy. 1 dose of Td every 10 years.  Varicella vaccine.** / Consult your health care provider. Pregnant females who do not have evidence of immunity should receive the first dose after pregnancy.  Zoster vaccine.** / 1 dose for adults aged 60 years or older.  Measles, mumps, rubella (MMR) vaccine.** / You need at least 1 dose of MMR if you were born in 1957 or later. You may also need a 2nd dose. For females of childbearing age, rubella immunity should be determined. If there is no evidence of immunity, females who are not pregnant should be vaccinated. If there is no evidence of immunity, females who are pregnant should delay immunization until after pregnancy.  Pneumococcal 13-valent conjugate (PCV13) vaccine.** / Consult your health care provider.  Pneumococcal polysaccharide (PPSV23) vaccine.** / 1 to 2 doses if you smoke cigarettes or if you have certain conditions.  Meningococcal vaccine.** / Consult your health care provider.  Hepatitis A vaccine.** / Consult your health care provider.  Hepatitis B vaccine.** / Consult your health care provider.  Haemophilus influenzae type b (Hib) vaccine.** / Consult your health care provider. Ages 65   years and over  Blood pressure check.** / Every 1 to 2 years.  Lipid and cholesterol check.** / Every 5 years beginning at age 22 years.  Lung cancer screening. / Every year if you are aged 73-80 years and have a 30-pack-year history of smoking and currently smoke or have quit within the past 15 years. Yearly screening is stopped once you have quit smoking for at least 15 years or develop a health problem that would prevent you from having lung cancer  treatment.  Clinical breast exam.** / Every year after age 4 years.  BRCA-related cancer risk assessment.** / For women who have family members with a BRCA-related cancer (breast, ovarian, tubal, or peritoneal cancers).  Mammogram.** / Every year beginning at age 40 years and continuing for as long as you are in good health. Consult with your health care provider.  Pap test.** / Every 3 years starting at age 9 years through age 34 or 91 years with 3 consecutive normal Pap tests. Testing can be stopped between 65 and 70 years with 3 consecutive normal Pap tests and no abnormal Pap or HPV tests in the past 10 years.  HPV screening.** / Every 3 years from ages 57 years through ages 64 or 45 years with a history of 3 consecutive normal Pap tests. Testing can be stopped between 65 and 70 years with 3 consecutive normal Pap tests and no abnormal Pap or HPV tests in the past 10 years.  Fecal occult blood test (FOBT) of stool. / Every year beginning at age 15 years and continuing until age 17 years. You may not need to do this test if you get a colonoscopy every 10 years.  Flexible sigmoidoscopy or colonoscopy.** / Every 5 years for a flexible sigmoidoscopy or every 10 years for a colonoscopy beginning at age 86 years and continuing until age 71 years.  Hepatitis C blood test.** / For all people born from [birth year] through 1965 and any individual with known risks for hepatitis C.  Osteoporosis screening.** / A one-time screening for women ages 83 years and over and women at risk for fractures or osteoporosis.  Skin self-exam. / Monthly.  Influenza vaccine. / Every year.  Tetanus, diphtheria, and acellular pertussis (Tdap/Td) vaccine.** / 1 dose of Td every 10 years.  Varicella vaccine.** / Consult your health care provider.  Zoster vaccine.** / 1 dose for adults aged 61 years or older.  Pneumococcal 13-valent conjugate (PCV13) vaccine.** / Consult your health care provider.  Pneumococcal  polysaccharide (PPSV23) vaccine.** / 1 dose for all adults aged 28 years and older.  Meningococcal vaccine.** / Consult your health care provider.  Hepatitis A vaccine.** / Consult your health care provider.  Hepatitis B vaccine.** / Consult your health care provider.  Haemophilus influenzae type b (Hib) vaccine.** / Consult your health care provider. ** Family history and personal history of risk and conditions may change your health care provider's recommendations. Document Released: 04/26/2001 Document Revised: 07/15/2013 Document Reviewed: 07/26/2010 [facility] Patient Information 2015 [place], [place]. This information is not intended to replace advice given to you by your health care provider. Make sure you discuss any questions you have with your health care provider.

## 2014-01-02 ENCOUNTER — Encounter: Payer: Self-pay | Admitting: Internal Medicine

## 2014-01-02 LAB — LIPID PANEL
CHOL/HDL RATIO: 2
CHOLESTEROL: 152 mg/dL (ref 0–200)
HDL: 75.1 mg/dL (ref >39.00)
LDL CALC: 66 mg/dL (ref 0–99)
NONHDL: 76.9
Triglycerides: 57 mg/dL (ref 0.0–149.0)
VLDL: 11.4 mg/dL (ref 0.0–40.0)

## 2014-01-02 LAB — COMPREHENSIVE METABOLIC PANEL
ALBUMIN: 4 g/dL (ref 3.5–5.2)
ALT: 25 U/L (ref 0–35)
AST: 27 U/L (ref 0–37)
Alkaline Phosphatase: 85 U/L (ref 39–117)
BILIRUBIN TOTAL: 1 mg/dL (ref 0.2–1.2)
BUN: 10 mg/dL (ref 6–23)
CO2: 27 mEq/L (ref 19–32)
Calcium: 9.7 mg/dL (ref 8.4–10.5)
Chloride: 104 mEq/L (ref 96–112)
Creatinine, Ser: 1 mg/dL (ref 0.4–1.2)
GFR: 58.54 mL/min — ABNORMAL LOW (ref >60.00)
Glucose, Bld: 79 mg/dL (ref 70–99)
POTASSIUM: 4.5 meq/L (ref 3.5–5.1)
Sodium: 140 mEq/L (ref 135–145)
TOTAL PROTEIN: 7.4 g/dL (ref 6.0–8.3)

## 2014-01-02 LAB — TSH: TSH: 1.99 u[IU]/mL (ref 0.35–4.50)

## 2014-01-05 NOTE — Assessment & Plan Note (Signed)
Exam done Vaccines were reviewed and updated Labs ordered Pt ed material was given 

## 2014-01-05 NOTE — Progress Notes (Signed)
   Subjective:    Patient ID: [name], female    DOB: [birth year], 68 y.o.   MRN: [id number]  HPI Comments: She returns for a physical and she tells me that she feels well and offers no complaints.     Review of Systems  Constitutional: Negative.  Negative for fever, chills, diaphoresis, appetite change and fatigue.  HENT: Negative.   Eyes: Negative.   Respiratory: Negative.  Negative for cough, choking, chest tightness, shortness of breath and stridor.   Cardiovascular: Negative.  Negative for chest pain, palpitations and leg swelling.  Gastrointestinal: Negative.  Negative for nausea, vomiting, abdominal pain, diarrhea, constipation and blood in stool.  Endocrine: Negative.   Genitourinary: Negative.   Musculoskeletal: Negative.  Negative for arthralgias, back pain, myalgias and neck pain.  Skin: Negative.   Allergic/Immunologic: Negative.   Neurological: Negative.   Hematological: Negative.  Negative for adenopathy. Does not bruise/bleed easily.  Psychiatric/Behavioral: Negative.        Objective:   Physical Exam  Vitals reviewed. Constitutional: She is oriented to person, place, and time. She appears well-developed and well-nourished. No distress.  HENT:  Head: Normocephalic and atraumatic.  Mouth/Throat: Oropharynx is clear and moist. No oropharyngeal exudate.  Eyes: Conjunctivae are normal. Right eye exhibits no discharge. Left eye exhibits no discharge. No scleral icterus.  Neck: Normal range of motion. Neck supple. No JVD present. No tracheal deviation present. No thyromegaly present.  Cardiovascular: Normal rate, regular rhythm, normal heart sounds and intact distal pulses.  Exam reveals no gallop and no friction rub.   No murmur heard. Pulmonary/Chest: Effort normal and breath sounds normal. No stridor. No respiratory distress. She has no wheezes. She has no rales. She exhibits no tenderness.  Abdominal: Soft. Bowel sounds are normal. She exhibits no distension  and no mass. There is no tenderness. There is no rebound and no guarding.  Musculoskeletal: Normal range of motion. She exhibits no edema and no tenderness.  Lymphadenopathy:    She has no cervical adenopathy.  Neurological: She is oriented to person, place, and time.  Skin: Skin is warm and dry. No rash noted. She is not diaphoretic. No erythema. No pallor.          Assessment & Plan:

## 2014-02-27 ENCOUNTER — Encounter: Payer: Self-pay | Admitting: Family

## 2014-02-27 ENCOUNTER — Ambulatory Visit (INDEPENDENT_AMBULATORY_CARE_PROVIDER_SITE_OTHER): Payer: 59 | Admitting: Family

## 2014-02-27 VITALS — BP 160/72 | HR 67 | Temp 98.1°F | Resp 18 | Ht 64.0 in | Wt 140.8 lb

## 2014-02-27 DIAGNOSIS — J069 Acute upper respiratory infection, unspecified: Secondary | ICD-10-CM | POA: Insufficient documentation

## 2014-02-27 NOTE — Assessment & Plan Note (Signed)
Symptoms and exam consistent with acute upper respiratory infection. Continue supportive care and over-the-counter medications as needed for symptom relief. Follow up if symptoms worsen or fail to improve.

## 2014-02-27 NOTE — Progress Notes (Signed)
   Subjective:    Patient ID: [name], female    DOB: [birth year], 68 y.o.   MRN: [id number]  Chief Complaint  Patient presents with  . Cough    little cough, congestion, drainage and slight sore throat x2 days    HPI:  [name] is a 68 y.o. female who presents today for an acute visit.  Acute symptoms of cough, drainage and slight sore throat have been going on for about 2 days now. Has taken over the counter cough syrup and ibuprofen. Denies any fevers, body aches, nausea, vomiting or diarrhea. The course of the illness has been steady over the past couple days. Denies any recent antibiotic use.  Indicates her daughters have had some cold symptoms and her oldest had bronchitis.   No Known Allergies  Current Outpatient Prescriptions on File Prior to Visit  Medication Sig Dispense Refill  . aspirin 325 MG tablet Take 325 mg by mouth daily.      Marland Kitchen atorvastatin (LIPITOR) 40 MG tablet TAKE 1 TABLET BY MOUTH ONCE DAILY 90 tablet 3  . metoprolol tartrate (LOPRESSOR) 25 MG tablet TAKE 1/2 TABLET BY MOUTH TWICE DAILY 90 tablet 3  . nitroGLYCERIN (NITROSTAT) 0.4 MG SL tablet Place 0.4 mg under the tongue every 5 (five) minutes as needed for chest pain.     No current facility-administered medications on file prior to visit.   Review of Systems    See HPI   Objective:    BP 160/72 mmHg  Pulse 67  Temp(Src) 98.1 F (36.7 C) (Oral)  Resp 18  Ht 5\' 4"  (1.626 m)  Wt 140 lb 12.8 oz (63.866 kg)  BMI 24.16 kg/m2  SpO2 97% Nursing note and vital signs reviewed.  Physical Exam  Constitutional: She is oriented to person, place, and time. She appears well-developed and well-nourished. No distress.  HENT:  Right Ear: Hearing, tympanic membrane, external ear and ear canal normal.  Left Ear: Hearing, tympanic membrane, external ear and ear canal normal.  Nose: Right sinus exhibits no maxillary sinus tenderness and no frontal sinus tenderness. Left sinus exhibits no maxillary  sinus tenderness and no frontal sinus tenderness.  Mouth/Throat: Uvula is midline, oropharynx is clear and moist and mucous membranes are normal.  Neck: Neck supple.  Cardiovascular: Normal rate, regular rhythm and normal heart sounds.   Pulmonary/Chest: Effort normal and breath sounds normal.  Lymphadenopathy:    She has no cervical adenopathy.  Neurological: She is alert and oriented to person, place, and time.  Skin: Skin is warm and dry.  Psychiatric: She has a normal mood and affect. Her behavior is normal. Judgment and thought content normal.       Assessment & Plan:

## 2014-02-27 NOTE — Progress Notes (Signed)
Pre visit review using our clinic review tool, if applicable. No additional management support is needed unless otherwise documented below in the visit note. 

## 2014-02-27 NOTE — Patient Instructions (Signed)
Thank you for choosing [facility].  Summary/Instructions:  If your symptoms worsen or fail to improve, please contact our office for further instruction, or in case of emergency go directly to the emergency room at the closest medical facility.    Upper Respiratory Infection, Adult An upper respiratory infection (URI) is also sometimes known as the common cold. The upper respiratory tract includes the nose, sinuses, throat, trachea, and bronchi. Bronchi are the airways leading to the lungs. Most people improve within 1 week, but symptoms can last up to 2 weeks. A residual cough may last even longer.  CAUSES Many different viruses can infect the tissues lining the upper respiratory tract. The tissues become irritated and inflamed and often become very moist. Mucus production is also common. A cold is contagious. You can easily spread the virus to others by oral contact. This includes kissing, sharing a glass, coughing, or sneezing. Touching your mouth or nose and then touching a surface, which is then touched by another person, can also spread the virus. SYMPTOMS  Symptoms typically develop 1 to 3 days after you come in contact with a cold virus. Symptoms vary from person to person. They may include:  Runny nose.  Sneezing.  Nasal congestion.  Sinus irritation.  Sore throat.  Loss of voice (laryngitis).  Cough.  Fatigue.  Muscle aches.  Loss of appetite.  Headache.  Low-grade fever. DIAGNOSIS  You might diagnose your own cold based on familiar symptoms, since most people get a cold 2 to 3 times a year. Your caregiver can confirm this based on your exam. Most importantly, your caregiver can check that your symptoms are not due to another disease such as strep throat, sinusitis, pneumonia, asthma, or epiglottitis. Blood tests, throat tests, and X-rays are not necessary to diagnose a common cold, but they may sometimes be helpful in excluding other more serious diseases.  Your caregiver will decide if any further tests are required. RISKS AND COMPLICATIONS  You may be at risk for a more severe case of the common cold if you smoke cigarettes, have chronic heart disease (such as heart failure) or lung disease (such as asthma), or if you have a weakened immune system. The very young and very old are also at risk for more serious infections. Bacterial sinusitis, middle ear infections, and bacterial pneumonia can complicate the common cold. The common cold can worsen asthma and chronic obstructive pulmonary disease (COPD). Sometimes, these complications can require emergency medical care and may be life-threatening. PREVENTION  The best way to protect against getting a cold is to practice good hygiene. Avoid oral or hand contact with people with cold symptoms. Wash your hands often if contact occurs. There is no clear evidence that vitamin C, vitamin E, echinacea, or exercise reduces the chance of developing a cold. However, it is always recommended to get plenty of rest and practice good nutrition. TREATMENT  Treatment is directed at relieving symptoms. There is no cure. Antibiotics are not effective, because the infection is caused by a virus, not by bacteria. Treatment may include:  Increased fluid intake. Sports drinks offer valuable electrolytes, sugars, and fluids.  Breathing heated mist or steam (vaporizer or shower).  Eating chicken soup or other clear broths, and maintaining good nutrition.  Getting plenty of rest.  Using gargles or lozenges for comfort.  Controlling fevers with ibuprofen or acetaminophen as directed by your caregiver.  Increasing usage of your inhaler if you have asthma. Zinc gel and zinc lozenges, taken in the  first 24 hours of the common cold, can shorten the duration and lessen the severity of symptoms. Pain medicines may help with fever, muscle aches, and throat pain. A variety of non-prescription medicines are available to treat  congestion and runny nose. Your caregiver can make recommendations and may suggest nasal or lung inhalers for other symptoms.  HOME CARE INSTRUCTIONS   Only take over-the-counter or prescription medicines for pain, discomfort, or fever as directed by your caregiver.  Use a warm mist humidifier or inhale steam from a shower to increase air moisture. This may keep secretions moist and make it easier to breathe.  Drink enough water and fluids to keep your urine clear or pale yellow.  Rest as needed.  Return to work when your temperature has returned to normal or as your caregiver advises. You may need to stay home longer to avoid infecting others. You can also use a face mask and careful hand washing to prevent spread of the virus. SEEK MEDICAL CARE IF:   After the first few days, you feel you are getting worse rather than better.  You need your caregiver's advice about medicines to control symptoms.  You develop chills, worsening shortness of breath, or brown or red sputum. These may be signs of pneumonia.  You develop yellow or brown nasal discharge or pain in the face, especially when you bend forward. These may be signs of sinusitis.  You develop a fever, swollen neck glands, pain with swallowing, or white areas in the back of your throat. These may be signs of strep throat. SEEK IMMEDIATE MEDICAL CARE IF:   You have a fever.  You develop severe or persistent headache, ear pain, sinus pain, or chest pain.  You develop wheezing, a prolonged cough, cough up blood, or have a change in your usual mucus (if you have chronic lung disease).  You develop sore muscles or a stiff neck. Document Released: 08/24/2000 Document Revised: 05/23/2011 Document Reviewed: 06/05/2013 [facility] Patient Information 2015 [place], [place]. This information is not intended to replace advice given to you by your health care provider. Make sure you discuss any questions you have with your health care  provider.

## 2014-03-19 ENCOUNTER — Encounter: Payer: Self-pay | Admitting: Internal Medicine

## 2014-03-19 ENCOUNTER — Other Ambulatory Visit (INDEPENDENT_AMBULATORY_CARE_PROVIDER_SITE_OTHER): Payer: 59

## 2014-03-19 ENCOUNTER — Ambulatory Visit (INDEPENDENT_AMBULATORY_CARE_PROVIDER_SITE_OTHER): Payer: 59 | Admitting: Internal Medicine

## 2014-03-19 VITALS — BP 146/72 | HR 64 | Temp 98.0°F | Ht 64.0 in | Wt 139.0 lb

## 2014-03-19 DIAGNOSIS — N183 Chronic kidney disease, stage 3 unspecified: Secondary | ICD-10-CM

## 2014-03-19 LAB — URINALYSIS, ROUTINE W REFLEX MICROSCOPIC
Bilirubin Urine: NEGATIVE
Ketones, ur: NEGATIVE
Nitrite: NEGATIVE
SPECIFIC GRAVITY, URINE: 1.01 (ref 1.000–1.030)
Total Protein, Urine: NEGATIVE
Urine Glucose: NEGATIVE
Urobilinogen, UA: 0.2 (ref 0.0–1.0)
pH: 5.5 (ref 5.0–8.0)

## 2014-03-19 LAB — CBC WITH DIFFERENTIAL/PLATELET
Basophils Absolute: 0.1 10*3/uL (ref 0.0–0.1)
Basophils Relative: 0.6 % (ref 0.0–3.0)
EOS ABS: 0.5 10*3/uL (ref 0.0–0.7)
Eosinophils Relative: 5.1 % — ABNORMAL HIGH (ref 0.0–5.0)
HCT: 38.1 % (ref 36.0–46.0)
Hemoglobin: 12.9 g/dL (ref 12.0–15.0)
LYMPHS ABS: 2 10*3/uL (ref 0.7–4.0)
LYMPHS PCT: 21.2 % (ref 12.0–46.0)
MCHC: 33.8 g/dL (ref 30.0–36.0)
MCV: 93.3 fl (ref 78.0–100.0)
MONO ABS: 0.6 10*3/uL (ref 0.1–1.0)
Monocytes Relative: 6.9 % (ref 3.0–12.0)
NEUTROS ABS: 6.2 10*3/uL (ref 1.4–7.7)
Neutrophils Relative %: 66.2 % (ref 43.0–77.0)
Platelets: 218 10*3/uL (ref 150.0–400.0)
RBC: 4.09 Mil/uL (ref 3.87–5.11)
RDW: 12.9 % (ref 11.5–15.5)
WBC: 9.4 10*3/uL (ref 4.0–10.5)

## 2014-03-19 LAB — BASIC METABOLIC PANEL WITH GFR
BUN: 11 mg/dL (ref 6–23)
CO2: 28 meq/L (ref 19–32)
Calcium: 9.3 mg/dL (ref 8.4–10.5)
Chloride: 107 meq/L (ref 96–112)
Creatinine, Ser: 0.7 mg/dL (ref 0.4–1.2)
GFR: 84.13 mL/min (ref >60.00)
Glucose, Bld: 95 mg/dL (ref 70–99)
Potassium: 4 meq/L (ref 3.5–5.1)
Sodium: 140 meq/L (ref 135–145)

## 2014-03-19 NOTE — Progress Notes (Signed)
Pre visit review using our clinic review tool, if applicable. No additional management support is needed unless otherwise documented below in the visit note. 

## 2014-03-19 NOTE — Patient Instructions (Signed)
Chronic Kidney Disease °Chronic kidney disease occurs when the kidneys are damaged over a long period. The kidneys are two organs that lie on either side of the spine between the middle of the back and the front of the abdomen. The kidneys:  °· Remove wastes and extra water from the blood.   °· Produce important hormones. These help keep bones strong, regulate blood pressure, and help create red blood cells.   °· Balance the fluids and chemicals in the blood and tissues. °A small amount of kidney damage may not cause problems, but a large amount of damage may make it difficult or impossible for the kidneys to work the way they should. If steps are not taken to slow down the kidney damage or stop it from getting worse, the kidneys may stop working permanently. Most of the time, chronic kidney disease does not go away. However, it can often be controlled, and those with the disease can usually live normal lives. °CAUSES  °The most common causes of chronic kidney disease are diabetes and high blood pressure (hypertension). Chronic kidney disease may also be caused by:  °· Diseases that cause the kidneys' filters to become inflamed.   °· Diseases that affect the immune system.   °· Genetic diseases.   °· Medicines that damage the kidneys, such as anti-inflammatory medicines.   °· Poisoning or exposure to toxic substances.   °· A reoccurring kidney or urinary infection.   °· A problem with urine flow. This may be caused by:   °¨ Cancer.   °¨ Kidney stones.   °¨ An enlarged prostate in males. °SIGNS AND SYMPTOMS  °Because the kidney damage in chronic kidney disease occurs slowly, symptoms develop slowly and may not be obvious until the kidney damage becomes severe. A person may have a kidney disease for years without showing any symptoms. Symptoms can include:  °· Swelling (edema) of the legs, ankles, or feet.   °· Tiredness (lethargy).   °· Nausea or vomiting.   °· Confusion.   °· Problems with urination, such as:    °¨ Decreased urine production.   °¨ Frequent urination, especially at night.   °¨ Frequent accidents in children who are potty trained.   °· Muscle twitches and cramps.   °· Shortness of breath.  °· Weakness.   °· Persistent itchiness.   °· Loss of appetite. °· Metallic taste in the mouth. °· Trouble sleeping. °· Slowed development in children. °· Short stature in children. °DIAGNOSIS  °Chronic kidney disease may be detected and diagnosed by tests, including blood, urine, imaging, or kidney biopsy tests.  °TREATMENT  °Most chronic kidney diseases cannot be cured. Treatment usually involves relieving symptoms and preventing or slowing the progression of the disease. Treatment may include:  °· A special diet. You may need to avoid alcohol and foods that are salty and high in potassium.   °· Medicines. These may:   °¨ Lower blood pressure.   °¨ Relieve anemia.   °¨ Relieve swelling.   °¨ Protect the bones. °HOME CARE INSTRUCTIONS  °· Follow your prescribed diet.   °· Take medicines only as directed by your health care provider. Do not take any new medicines (prescription, over-the-counter, or nutritional supplements) unless approved by your health care provider. Many medicines can worsen your kidney damage or need to have the dose adjusted.   °· Quit smoking if you smoke. Talk to your health care provider about a smoking cessation program.   °· Keep all follow-up visits as directed by your health care provider. °SEEK IMMEDIATE MEDICAL CARE IF: °· Your symptoms get worse or you develop new symptoms.   °· You develop symptoms of end-stage kidney disease. These   include:   °¨ Headaches.   °¨ Abnormally dark or light skin.   °¨ Numbness in the hands or feet.   °¨ Easy bruising.   °¨ Frequent hiccups.   °¨ Menstruation stops.   °· You have a fever.   °· You have decreased urine production.   °· You have pain or bleeding when urinating. °MAKE SURE YOU: °· Understand these instructions. °· Will watch your condition. °· Will  get help right away if you are not doing well or get worse. °FOR MORE INFORMATION  °· American Association of Kidney Patients: www.aakp.org °· National Kidney Foundation: www.kidney.org °· American Kidney Fund: www.akfinc.org °· [employer] [facility]: www.lifeoptions.org and www.kidneyschool.org °Document Released: 12/08/2007 Document Revised: 07/15/2013 Document Reviewed: 10/28/2011 °[employer]® Patient Information ©2015 [employer]. This information is not intended to replace advice given to you by your health care provider. Make sure you discuss any questions you have with your health care provider. ° °

## 2014-03-20 ENCOUNTER — Encounter: Payer: Self-pay | Admitting: Internal Medicine

## 2014-03-20 NOTE — Assessment & Plan Note (Signed)
Labs today show a marked improvement in her renal fxn Will follow for now

## 2014-03-20 NOTE — Progress Notes (Signed)
   Subjective:    Patient ID: [name], female    DOB: 05-07-1945, 69 y.o.   MRN: [id number]  HPI  She retruns for a f/up on her renal function, she feels well and offers no complaints.   Review of Systems  Constitutional: Negative.  Negative for fever, chills, diaphoresis, appetite change and fatigue.  HENT: Negative.   Eyes: Negative.   Respiratory: Negative.  Negative for cough, choking, chest tightness, shortness of breath and stridor.   Cardiovascular: Negative.  Negative for chest pain, palpitations and leg swelling.  Gastrointestinal: Negative.  Negative for nausea, vomiting, abdominal pain, diarrhea, constipation and blood in stool.  Endocrine: Negative.   Genitourinary: Negative for dysuria, urgency, hematuria, difficulty urinating and vaginal pain.  Musculoskeletal: Negative.   Skin: Negative.   Allergic/Immunologic: Negative.   Neurological: Negative.   Hematological: Negative.  Negative for adenopathy. Does not bruise/bleed easily.  Psychiatric/Behavioral: Negative.        Objective:   Physical Exam  Constitutional: She is oriented to person, place, and time. She appears well-developed and well-nourished. No distress.  HENT:  Head: Normocephalic and atraumatic.  Mouth/Throat: Oropharynx is clear and moist. No oropharyngeal exudate.  Eyes: Conjunctivae are normal. Right eye exhibits no discharge. Left eye exhibits no discharge. No scleral icterus.  Neck: Normal range of motion. Neck supple. No JVD present. No tracheal deviation present. No thyromegaly present.  Cardiovascular: Normal rate, regular rhythm, normal heart sounds and intact distal pulses.  Exam reveals no gallop and no friction rub.   No murmur heard. Pulmonary/Chest: Effort normal and breath sounds normal. No stridor. No respiratory distress. She has no wheezes. She has no rales.  Abdominal: Soft. Bowel sounds are normal. She exhibits no distension and no mass. There is no tenderness. There is no  rebound and no guarding.  Musculoskeletal: Normal range of motion. She exhibits no edema or tenderness.  Lymphadenopathy:    She has no cervical adenopathy.  Neurological: She is oriented to person, place, and time.  Skin: Skin is warm and dry. No rash noted. She is not diaphoretic. No erythema. No pallor.  Vitals reviewed.   Lab Results  Component Value Date   WBC 9.4 03/19/2014   HGB 12.9 03/19/2014   HCT 38.1 03/19/2014   PLT 218.0 03/19/2014   GLUCOSE 95 03/19/2014   CHOL 152 01/01/2014   TRIG 57.0 01/01/2014   HDL 75.10 01/01/2014   LDLCALC 66 01/01/2014   ALT 25 01/01/2014   AST 27 01/01/2014   NA 140 03/19/2014   K 4.0 03/19/2014   CL 107 03/19/2014   CREATININE 0.7 03/19/2014   BUN 11 03/19/2014   CO2 28 03/19/2014   TSH 1.99 01/01/2014        Assessment & Plan:

## 2014-08-01 ENCOUNTER — Encounter: Payer: Self-pay | Admitting: Internal Medicine

## 2014-08-01 ENCOUNTER — Ambulatory Visit (INDEPENDENT_AMBULATORY_CARE_PROVIDER_SITE_OTHER): Payer: 59 | Admitting: Internal Medicine

## 2014-08-01 VITALS — BP 160/78 | HR 65 | Ht 64.0 in | Wt 141.0 lb

## 2014-08-01 DIAGNOSIS — I2510 Atherosclerotic heart disease of native coronary artery without angina pectoris: Secondary | ICD-10-CM | POA: Diagnosis not present

## 2014-08-01 MED ORDER — METOPROLOL TARTRATE 25 MG PO TABS: 12.5000 mg | ORAL_TABLET | Freq: Every day | ORAL | Status: DC

## 2014-08-01 MED ORDER — AMLODIPINE BESYLATE 2.5 MG PO TABS: 2.5000 mg | ORAL_TABLET | Freq: Every day | ORAL | Status: DC

## 2014-08-01 NOTE — Progress Notes (Signed)
Cardiology Office Note   Date:  08/01/2014   ID:  [name], [name] 07-Mar-1946, MRN [id number]  PCP:  Scarlette Calico, MD  Cardiologist:   Dorris Carnes, MD   No chief complaint on file.     History of Present Illness: [name] is a 69 y.o. female with a history of f CAD She is s/p CABG in 2005 (LIMA to LAD; SVG to PDA; SVG to Diag). Also a history of tob use, HTN.  She had a myoview in 2006 that showed normal perfusion  I saw her in clinic in august 2015  Patinet wlks 20 min per day   No change  Breathing OK  No chest tightness No dizziness  No PNd    Current Outpatient Prescriptions  Medication Sig Dispense Refill  . aspirin 81 MG tablet Take 81 mg by mouth daily.    Marland Kitchen atorvastatin (LIPITOR) 40 MG tablet TAKE 1 TABLET BY MOUTH ONCE DAILY 90 tablet 3  . metoprolol tartrate (LOPRESSOR) 25 MG tablet TAKE 1/2 TABLET BY MOUTH TWICE DAILY 90 tablet 3  . nitroGLYCERIN (NITROSTAT) 0.4 MG SL tablet Place 0.4 mg under the tongue every 5 (five) minutes as needed for chest pain.     No current facility-administered medications for this visit.    Allergies:   Review of patient's allergies indicates no known allergies.   Past Medical History  Diagnosis Date  . Cervical cancer     Dr Ulanda Edison  . CAD (coronary artery disease)   . Hyperlipidemia     Past Surgical History  Procedure Laterality Date  . Coronary artery bypass graft       Social History:  The patient  reports that she quit smoking about 29 years ago. She does not have any smokeless tobacco history on file. She reports that she does not drink alcohol or use illicit drugs.   Family History:  The patient's family history includes Coronary artery disease in her other; Diabetes in her other; Heart attack in her father; Hypertension in her mother and other. There is no history of Cancer, Stroke, or Hyperlipidemia.    ROS:  Please see the history of present illness. All other systems are reviewed and  Negative to  the above problem except as noted.    PHYSICAL EXAM: VS:  BP 160/78 mmHg  Pulse 65  Ht 5\' 4"  (1.626 m)  Wt 141 lb (63.957 kg)  BMI 24.19 kg/m2  GEN: Well nourished, well developed, in no acute distress HEENT: normal Neck: no JVD, carotid bruits, or masses Cardiac: RRR; no murmurs, rubs, or gallops,no edema  Respiratory:  clear to auscultation bilaterally, normal work of breathing GI: soft, nontender, nondistended, + BS  No hepatomegaly  MS: no deformity Moving all extremities   Skin: warm and dry, no rash Neuro:  Strength and sensation are intact Psych: euthymic mood, full affect   EKG:  EKG is ordered today.  SR 60 bpm     Lipid Panel    Component Value Date/Time   CHOL 152 01/01/2014 1538   TRIG 57.0 01/01/2014 1538   HDL 75.10 01/01/2014 1538   CHOLHDL 2 01/01/2014 1538   VLDL 11.4 01/01/2014 1538   LDLCALC 66 01/01/2014 1538      Wt Readings from Last 3 Encounters:  08/01/14 141 lb (63.957 kg)  03/19/14 139 lb (63.05 kg)  02/27/14 140 lb 12.8 oz (63.866 kg)      ASSESSMENT AND PLAN:  1.  CAD  No symptoms  of angina  Active  Continue to follw   2.  HTN  BP is up  It has been on some clinic visits  I would recomm tapering back on metoprolol 12.5 qd x 1 wk  Then stop Then start amlodipine 2.5mg   F/U bp in 5 wks    3l  HL  Very good control  Continue lipitor     Disposition:   FU with nurses visit on day I am working for BP check    Signed, Dorris Carnes, MD  08/01/2014 4:35 PM    [address], [place], [place]  [address] Phone: [phone]; Fax: [phone]

## 2014-08-01 NOTE — Patient Instructions (Signed)
Your physician has recommended you make the following change in your medication:  Decrease metoprolol 12.5 mg daily for one week, then stop it. After you stop metoprolol, start amlodipine 2.5 mg daily. Continue all other medications the same. Please come for a nurse BP check in 5-6 weeks. Please bring your home BP monitor to this visit so that it can be checked for accuracy. Dr. Harrington Challenger will check in with your during this visit. Your physician recommends that you schedule a follow-up appointment in: 1 year with Dr. Harrington Challenger. You will receive a reminder letter in the mail in about 10 months reminding you to call and schedule your appointment. If you don't receive this letter, please contact our office.

## 2014-08-06 ENCOUNTER — Other Ambulatory Visit: Payer: Self-pay

## 2014-08-06 MED ORDER — METOPROLOL TARTRATE 25 MG PO TABS: 12.5000 mg | ORAL_TABLET | Freq: Every day | ORAL | Status: DC

## 2014-09-08 ENCOUNTER — Ambulatory Visit (INDEPENDENT_AMBULATORY_CARE_PROVIDER_SITE_OTHER): Payer: 59

## 2014-09-08 VITALS — BP 140/60 | HR 71 | Resp 18 | Ht 64.0 in | Wt 141.0 lb

## 2014-09-08 DIAGNOSIS — Z136 Encounter for screening for cardiovascular disorders: Secondary | ICD-10-CM

## 2014-09-08 DIAGNOSIS — Z0130 Encounter for examination of blood pressure without abnormal findings: Secondary | ICD-10-CM

## 2014-09-08 NOTE — Patient Instructions (Signed)
Medication Instructions:  Your physician recommends that you continue on your current medications as directed. Please refer to the Current Medication list given to you today.  Labwork: NONE  Testing/Procedures: NONE  Follow-Up: Your physician recommends that you schedule a follow-up appointment as directed at last office visit.  Any Other Special Instructions Will Be Listed Below (If Applicable).

## 2014-09-08 NOTE — Progress Notes (Signed)
1.) Reason for visit: BP check  2.) Name of MD requesting visit: Dr. Harrington Challenger  3.) H&P: Patient has history of CAD and  Hyperlipidemia. Patient's medications have been change recently. Metoprolol was decreased to 12.5 mg for one week, then stopped. After stopping metoprolol, patient started amlodipine 2.5 mg daily. Patient is coming in for a BP check post office visit 5 weeks. Patient bringing in her home BP monitor to check for accuracy.   4.) ROS related to problem: Patient's BP on right arm is 140/60 manually and BP 139/90 with patient's home machine. On Left arm BP is 140/62 manually and BP 130/69 with home machine. Patient's BP monitor is not as accurate as manual BP check. Patient will call company of BP and relay findings.  5.) Assessment and plan per MD: Dr. Harrington Challenger reviewed BPs and recommends patient to continue with current medications. Patient verbalized understanding.

## 2014-10-13 ENCOUNTER — Other Ambulatory Visit: Payer: Self-pay | Admitting: Internal Medicine

## 2015-04-26 MED FILL — AMLODIPINE BESYLATE 2.5 MG: 2.5 | 90 days supply | Qty: 90 | Fill #3

## 2015-04-26 MED FILL — ATORVASTATIN 40 MG TABLET: 40 | 90 days supply | Qty: 90 | Fill #2

## 2015-07-16 DIAGNOSIS — H524 Presbyopia: Secondary | ICD-10-CM | POA: Diagnosis not present

## 2015-08-06 MED FILL — ATORVASTATIN 40 MG TABLET: 40 | 90 days supply | Qty: 90 | Fill #3

## 2015-08-07 ENCOUNTER — Ambulatory Visit: Payer: 59 | Admitting: Internal Medicine

## 2015-08-19 ENCOUNTER — Other Ambulatory Visit: Payer: Self-pay | Admitting: Internal Medicine

## 2015-08-19 MED FILL — AMLODIPINE BESYLATE 2.5 MG: 2.5 | 90 days supply | Qty: 90 | Fill #0

## 2015-08-21 ENCOUNTER — Encounter: Payer: Self-pay | Admitting: Internal Medicine

## 2015-08-21 ENCOUNTER — Ambulatory Visit (INDEPENDENT_AMBULATORY_CARE_PROVIDER_SITE_OTHER): Payer: 59 | Admitting: Internal Medicine

## 2015-08-21 VITALS — BP 132/70 | HR 72 | Ht 64.0 in | Wt 143.0 lb

## 2015-08-21 DIAGNOSIS — Z79899 Other long term (current) drug therapy: Secondary | ICD-10-CM

## 2015-08-21 DIAGNOSIS — I2510 Atherosclerotic heart disease of native coronary artery without angina pectoris: Secondary | ICD-10-CM

## 2015-08-21 DIAGNOSIS — I25810 Atherosclerosis of coronary artery bypass graft(s) without angina pectoris: Secondary | ICD-10-CM | POA: Diagnosis not present

## 2015-08-21 DIAGNOSIS — Z0130 Encounter for examination of blood pressure without abnormal findings: Secondary | ICD-10-CM

## 2015-08-21 DIAGNOSIS — Z136 Encounter for screening for cardiovascular disorders: Secondary | ICD-10-CM

## 2015-08-21 NOTE — Progress Notes (Signed)
Cardiology Office Note   Date:  08/21/2015   ID:  [name], [name] 1946-02-15, MRN [id number]  PCP:  Scarlette Calico, MD  Cardiologist:   Dorris Carnes, MD   F/U of CAD      History of Present Illness: [name] is a 70 y.o. female with a history off CAD She is s/p CABG in 2005 (LIMA to LAD; SVG to PDA; SVG to Diag). Also a history of tob use, HTN.  She had a myoview in 2006 that showed normal perfusion  I saw her in clinic in May 2016    Since seen she has done well  No Cp  Breathing is good  Walks 20 min per day     Outpatient Prescriptions Prior to Visit  Medication Sig Dispense Refill  . amLODipine (NORVASC) 2.5 MG tablet Take 1 tablet (2.5 mg total) by mouth daily. 90 tablet 0  . aspirin 81 MG tablet Take 81 mg by mouth daily.    Marland Kitchen atorvastatin (LIPITOR) 40 MG tablet TAKE 1 TABLET BY MOUTH ONCE DAILY 90 tablet 3  . atorvastatin (LIPITOR) 40 MG tablet TAKE 1 TABLET BY MOUTH ONCE DAILY 90 tablet 3  . metoprolol tartrate (LOPRESSOR) 25 MG tablet Take 0.5 tablets (12.5 mg total) by mouth daily. For 1 week, then STOP METOPROLOL 4 tablet 0  . nitroGLYCERIN (NITROSTAT) 0.4 MG SL tablet Place 0.4 mg under the tongue every 5 (five) minutes as needed for chest pain. Reported on 08/21/2015     No facility-administered medications prior to visit.     Allergies:   Review of patient's allergies indicates no known allergies.   Past Medical History  Diagnosis Date  . Cervical cancer ([place])     Dr Ulanda Edison  . CAD (coronary artery disease)   . Hyperlipidemia     Past Surgical History  Procedure Laterality Date  . Coronary artery bypass graft       Social History:  The patient  reports that she quit smoking about 30 years ago. She does not have any smokeless tobacco history on file. She reports that she does not drink alcohol or use illicit drugs.   Family History:  The patient's family history includes Coronary artery disease in her other; Diabetes in her other; Heart  attack in her father; Hypertension in her mother and other. There is no history of Cancer, Stroke, or Hyperlipidemia.    ROS:  Please see the history of present illness. All other systems are reviewed and  Negative to the above problem except as noted.    PHYSICAL EXAM: VS:  BP 132/70 mmHg  Pulse 72  Ht 5\' 4"  (1.626 m)  Wt 143 lb (64.864 kg)  BMI 24.53 kg/m2  GEN: Well nourished, well developed, in no acute distress HEENT: normal Neck: no JVD, carotid bruits, or masses Cardiac: RRR; no murmurs, rubs, or gallops,no edema  Respiratory:  clear to auscultation bilaterally, normal work of breathing GI: soft, nontender, nondistended, + BS  No hepatomegaly  MS: no deformity Moving all extremities   Skin: warm and dry, no rash Neuro:  Strength and sensation are intact Psych: euthymic mood, full affect   EKG:  EKG is ordered today.  SR 72   Lipid Panel    Component Value Date/Time   CHOL 152 01/01/2014 1538   TRIG 57.0 01/01/2014 1538   HDL 75.10 01/01/2014 1538   CHOLHDL 2 01/01/2014 1538   VLDL 11.4 01/01/2014 1538   LDLCALC 66 01/01/2014 1538  Wt Readings from Last 3 Encounters:  08/21/15 143 lb (64.864 kg)  09/08/14 141 lb (63.957 kg)  08/01/14 141 lb (63.957 kg)      ASSESSMENT AND PLAN:  1  CAD  Doing good  No symptoms to suggest angina  Keep on same meds  2  HL  Check lipids    3  HTN  Good control Keep on cureent meds   Check CBC and BMET as well    F/U in 1 year     Signed, Dorris Carnes, MD  08/21/2015 4:22 PM    [address], [place], [place]  [address] Phone: [phone]; Fax: [phone]

## 2015-08-21 NOTE — Patient Instructions (Addendum)
Medication Instructions:  The current medical regimen is effective;  continue present plan and medications.  Labwork: Please return for fasting lab work. (Lipid, BMP and CBC)  Follow-Up: Follow up in 1 year with Dr. Percival Spanish.  You will receive a letter in the mail 2 months before you are due.  Please call us when you receive this letter to schedule your follow up appointment.  If you need a refill on your cardiac medications before your next appointment, please call your pharmacy.  Thank you for choosing [facility]!!

## 2015-11-09 ENCOUNTER — Other Ambulatory Visit: Payer: Self-pay | Admitting: Internal Medicine

## 2015-11-09 MED FILL — ATORVASTATIN 40 MG TABLET: 40 | 90 days supply | Qty: 90 | Fill #0

## 2015-11-17 ENCOUNTER — Encounter: Payer: Self-pay | Admitting: Internal Medicine

## 2015-11-27 ENCOUNTER — Other Ambulatory Visit: Payer: 59 | Admitting: *Deleted

## 2015-11-27 DIAGNOSIS — Z79899 Other long term (current) drug therapy: Secondary | ICD-10-CM

## 2015-11-27 DIAGNOSIS — I25810 Atherosclerosis of coronary artery bypass graft(s) without angina pectoris: Secondary | ICD-10-CM | POA: Diagnosis not present

## 2015-11-27 DIAGNOSIS — I2510 Atherosclerotic heart disease of native coronary artery without angina pectoris: Secondary | ICD-10-CM | POA: Diagnosis not present

## 2015-11-27 LAB — LIPID PANEL
CHOL/HDL RATIO: 1.8 ratio (ref <=5.0)
Cholesterol: 164 mg/dL (ref 125–200)
HDL: 90 mg/dL (ref >=46)
LDL CALC: 60 mg/dL (ref <130)
Triglycerides: 70 mg/dL (ref <150)
VLDL: 14 mg/dL (ref <30)

## 2015-11-27 LAB — CBC
HEMATOCRIT: 40.5 % (ref 35.0–45.0)
Hemoglobin: 13.7 g/dL (ref 11.7–15.5)
MCH: 31.4 pg (ref 27.0–33.0)
MCHC: 33.8 g/dL (ref 32.0–36.0)
MCV: 92.9 fL (ref 80.0–100.0)
MPV: 11.4 fL (ref 7.5–12.5)
PLATELETS: 185 10*3/uL (ref 140–400)
RBC: 4.36 MIL/uL (ref 3.80–5.10)
RDW: 13.2 % (ref 11.0–15.0)
WBC: 7.3 10*3/uL (ref 3.8–10.8)

## 2015-11-27 LAB — BASIC METABOLIC PANEL
BUN: 14 mg/dL (ref 7–25)
CHLORIDE: 105 mmol/L (ref 98–110)
CO2: 26 mmol/L (ref 20–31)
Calcium: 9.5 mg/dL (ref 8.6–10.4)
Creat: 0.92 mg/dL (ref 0.60–0.93)
Glucose, Bld: 82 mg/dL (ref 65–99)
POTASSIUM: 4.7 mmol/L (ref 3.5–5.3)
SODIUM: 140 mmol/L (ref 135–146)

## 2015-12-07 ENCOUNTER — Other Ambulatory Visit: Payer: Self-pay | Admitting: Internal Medicine

## 2015-12-07 MED FILL — AMLODIPINE BESYLATE 2.5 MG: 2.5 | 90 days supply | Qty: 90 | Fill #0

## 2016-01-20 DIAGNOSIS — Z1389 Encounter for screening for other disorder: Secondary | ICD-10-CM | POA: Diagnosis not present

## 2016-01-20 DIAGNOSIS — Z01419 Encounter for gynecological examination (general) (routine) without abnormal findings: Secondary | ICD-10-CM | POA: Diagnosis not present

## 2016-01-20 DIAGNOSIS — Z1231 Encounter for screening mammogram for malignant neoplasm of breast: Secondary | ICD-10-CM | POA: Diagnosis not present

## 2016-01-20 DIAGNOSIS — Z130 Encounter for screening for diseases of the blood and blood-forming organs and certain disorders involving the immune mechanism: Secondary | ICD-10-CM | POA: Diagnosis not present

## 2016-01-20 DIAGNOSIS — Z124 Encounter for screening for malignant neoplasm of cervix: Secondary | ICD-10-CM | POA: Diagnosis not present

## 2016-01-21 DIAGNOSIS — Z124 Encounter for screening for malignant neoplasm of cervix: Secondary | ICD-10-CM | POA: Diagnosis not present

## 2016-02-22 MED FILL — ATORVASTATIN 40 MG TABLET: 40 | 90 days supply | Qty: 90 | Fill #1

## 2016-03-11 MED FILL — AMLODIPINE BESYLATE 2.5 MG: 2.5 | 90 days supply | Qty: 90 | Fill #1

## 2016-05-31 MED FILL — ATORVASTATIN 40 MG TABLET: 40 | 90 days supply | Qty: 90 | Fill #2

## 2016-06-14 MED FILL — AMLODIPINE BESYLATE 2.5 MG: 2.5 | 90 days supply | Qty: 90 | Fill #2

## 2016-08-15 ENCOUNTER — Encounter: Payer: Self-pay | Admitting: Nurse Practitioner

## 2016-08-15 ENCOUNTER — Ambulatory Visit (INDEPENDENT_AMBULATORY_CARE_PROVIDER_SITE_OTHER): Payer: 59 | Admitting: Nurse Practitioner

## 2016-08-15 VITALS — BP 134/72 | HR 64 | Temp 97.8°F | Ht 64.0 in | Wt 143.0 lb

## 2016-08-15 DIAGNOSIS — H6122 Impacted cerumen, left ear: Secondary | ICD-10-CM | POA: Diagnosis not present

## 2016-08-15 NOTE — Progress Notes (Signed)
Subjective:  Patient ID: [name], female    DOB: [birth year]  Age: 71 y.o. MRN: [id number]  CC: Ear Fullness (left ear stop up for 5 days. )   Ear Fullness   There is pain in the left ear. This is a new problem. The current episode started in the past 7 days. The problem occurs constantly. The problem has been unchanged. There has been no fever. Associated symptoms include hearing loss. Pertinent negatives include no abdominal pain, coughing, diarrhea, ear discharge, headaches, neck pain, rash, rhinorrhea, sore throat or vomiting. She has tried nothing for the symptoms.    Outpatient Medications Prior to Visit  Medication Sig Dispense Refill  . amLODipine (NORVASC) 2.5 MG tablet TAKE 1 TABLET BY MOUTH DAILY. 90 tablet 2  . aspirin 81 MG tablet Take 81 mg by mouth daily.    Marland Kitchen atorvastatin (LIPITOR) 40 MG tablet TAKE 1 TABLET BY MOUTH ONCE DAILY 90 tablet 3  . atorvastatin (LIPITOR) 40 MG tablet TAKE 1 TABLET BY MOUTH ONCE DAILY 90 tablet 3  . nitroGLYCERIN (NITROSTAT) 0.4 MG SL tablet Place 0.4 mg under the tongue every 5 (five) minutes as needed for chest pain (X3 DOSES BEFORE CALLING 911).    . metoprolol tartrate (LOPRESSOR) 25 MG tablet Take 0.5 tablets (12.5 mg total) by mouth daily. For 1 week, then STOP METOPROLOL (Patient not taking: Reported on 08/15/2016) 4 tablet 0   No facility-administered medications prior to visit.     ROS See HPI  Procedure Note :     Procedure :  Ear irrigation (left)   Indication:  Cerumen impaction   Risks, including pain, dizziness, eardrum perforation, bleeding, infection and others as well as benefits were explained to the patient in detail. Verbal consent was obtained and the patient agreed to proceed.    We used "The Elephant Ear Irrigation Device" filled with lukewarm water for irrigation. A large amount wax was recovered. Procedure has also required manual wax removal with an ear loop.   Tolerated well. Complications: None.   Postprocedure instructions :  Call if problems.  Objective:  BP 134/72   Pulse 64   Temp 97.8 F (36.6 C)   Ht 5\' 4"  (1.626 m)   Wt 143 lb (64.9 kg)   SpO2 99%   BMI 24.55 kg/m   BP Readings from Last 3 Encounters:  08/15/16 134/72  08/21/15 132/70  09/08/14 140/60    Wt Readings from Last 3 Encounters:  08/15/16 143 lb (64.9 kg)  08/21/15 143 lb (64.9 kg)  09/08/14 141 lb (64 kg)    Physical Exam  Constitutional: She is oriented to person, place, and time. No distress.  HENT:  Head: Normocephalic.  Right Ear: Tympanic membrane, external ear and ear canal normal.  Left Ear: Tympanic membrane, external ear and ear canal normal.  Nose: Nose normal.  Mouth/Throat: Oropharynx is clear and moist. No oropharyngeal exudate.  Eyes: No scleral icterus.  Cardiovascular: Normal rate.   Pulmonary/Chest: Effort normal.  Neurological: She is alert and oriented to person, place, and time.  Vitals reviewed.   Lab Results  Component Value Date   WBC 7.3 11/27/2015   HGB 13.7 11/27/2015   HCT 40.5 11/27/2015   PLT 185 11/27/2015   GLUCOSE 82 11/27/2015   CHOL 164 11/27/2015   TRIG 70 11/27/2015   HDL 90 11/27/2015   LDLCALC 60 11/27/2015   ALT 25 01/01/2014   AST 27 01/01/2014   NA 140 11/27/2015   K  4.7 11/27/2015   CL 105 11/27/2015   CREATININE 0.92 11/27/2015   BUN 14 11/27/2015   CO2 26 11/27/2015   TSH 1.99 01/01/2014    Mm Digital Screening  Result Date: 01/11/2013 CLINICAL DATA:  Screening. EXAM: DIGITAL SCREENING BILATERAL MAMMOGRAM WITH CAD COMPARISON:  Previous exam(s). ACR Breast Density Category b: There are scattered areas of fibroglandular density. FINDINGS: There are no findings suspicious for malignancy. Images were processed with CAD. IMPRESSION: No mammographic evidence of malignancy. A result letter of this screening mammogram will be mailed directly to the patient. RECOMMENDATION: Screening mammogram in one year. (Code:SM-B-01Y) BI-RADS CATEGORY  1:  Negative Electronically Signed   By: Shon Hale M.D.   On: 01/11/2013 16:16    Assessment & Plan:   Cybele was seen today for ear fullness.  Diagnoses and all orders for this visit:  Impacted cerumen of left ear   I am having Ms. States maintain her aspirin, metoprolol tartrate, atorvastatin, nitroGLYCERIN, atorvastatin, and amLODipine.  No orders of the defined types were placed in this encounter.   Follow-up: Return if symptoms worsen or fail to improve.  Wilfred Lacy, NP

## 2016-08-23 ENCOUNTER — Ambulatory Visit (INDEPENDENT_AMBULATORY_CARE_PROVIDER_SITE_OTHER): Payer: 59 | Admitting: Internal Medicine

## 2016-08-23 ENCOUNTER — Encounter: Payer: Self-pay | Admitting: Internal Medicine

## 2016-08-23 ENCOUNTER — Other Ambulatory Visit: Payer: 59

## 2016-08-23 VITALS — BP 132/74 | HR 61 | Temp 97.9°F | Resp 16 | Ht 64.0 in | Wt 143.5 lb

## 2016-08-23 DIAGNOSIS — N183 Chronic kidney disease, stage 3 unspecified: Secondary | ICD-10-CM

## 2016-08-23 DIAGNOSIS — I2510 Atherosclerotic heart disease of native coronary artery without angina pectoris: Secondary | ICD-10-CM | POA: Diagnosis not present

## 2016-08-23 DIAGNOSIS — E785 Hyperlipidemia, unspecified: Secondary | ICD-10-CM

## 2016-08-23 DIAGNOSIS — Z0000 Encounter for general adult medical examination without abnormal findings: Secondary | ICD-10-CM

## 2016-08-23 NOTE — Progress Notes (Signed)
Subjective:  Patient ID: [name], female    DOB: 02/08/1946  Age: 71 y.o. MRN: [id number]  CC: Annual Exam   HPI [name] presents for a CPX and BP check - she feels well and offers no complaints. She has had no recent episodes of CP, DOE, diaphoresis, or fatigue. She has not recently used nitroglycerin.  Outpatient Medications Prior to Visit  Medication Sig Dispense Refill  . amLODipine (NORVASC) 2.5 MG tablet TAKE 1 TABLET BY MOUTH DAILY. 90 tablet 2  . aspirin 81 MG tablet Take 81 mg by mouth daily.    Marland Kitchen atorvastatin (LIPITOR) 40 MG tablet TAKE 1 TABLET BY MOUTH ONCE DAILY 90 tablet 3  . nitroGLYCERIN (NITROSTAT) 0.4 MG SL tablet Place 0.4 mg under the tongue every 5 (five) minutes as needed for chest pain (X3 DOSES BEFORE CALLING 911).    Marland Kitchen atorvastatin (LIPITOR) 40 MG tablet TAKE 1 TABLET BY MOUTH ONCE DAILY 90 tablet 3  . metoprolol tartrate (LOPRESSOR) 25 MG tablet Take 0.5 tablets (12.5 mg total) by mouth daily. For 1 week, then STOP METOPROLOL (Patient not taking: Reported on 08/15/2016) 4 tablet 0   No facility-administered medications prior to visit.     ROS Review of Systems  Constitutional: Negative.  Negative for appetite change, diaphoresis, fatigue and unexpected weight change.  HENT: Negative.   Eyes: Negative for visual disturbance.  Respiratory: Negative.  Negative for cough, chest tightness, shortness of breath and wheezing.   Cardiovascular: Negative.  Negative for chest pain, palpitations and leg swelling.  Gastrointestinal: Negative for abdominal pain, blood in stool, constipation, diarrhea, nausea and vomiting.  Endocrine: Negative.   Genitourinary: Negative.  Negative for decreased urine volume, difficulty urinating, dysuria, hematuria and urgency.  Musculoskeletal: Negative.  Negative for arthralgias, back pain and myalgias.  Skin: Negative.  Negative for color change.  Allergic/Immunologic: Negative.   Neurological: Negative.  Negative for  dizziness, weakness and light-headedness.  Hematological: Negative for adenopathy. Does not bruise/bleed easily.  Psychiatric/Behavioral: Negative.     Objective:  BP 132/74 (BP Location: Left Arm, Patient Position: Sitting, Cuff Size: Normal)   Pulse 61   Temp 97.9 F (36.6 C) (Oral)   Resp 16   Ht 5\' 4"  (1.626 m)   Wt 143 lb 8 oz (65.1 kg)   SpO2 98%   BMI 24.63 kg/m   BP Readings from Last 3 Encounters:  08/23/16 132/74  08/15/16 134/72  08/21/15 132/70    Wt Readings from Last 3 Encounters:  08/23/16 143 lb 8 oz (65.1 kg)  08/15/16 143 lb (64.9 kg)  08/21/15 143 lb (64.9 kg)    Physical Exam  Constitutional: She is oriented to person, place, and time. No distress.  HENT:  Mouth/Throat: Oropharynx is clear and moist. No oropharyngeal exudate.  Eyes: Conjunctivae are normal. Right eye exhibits no discharge. Left eye exhibits no discharge. No scleral icterus.  Neck: Normal range of motion. Neck supple. No JVD present. No tracheal deviation present. No thyromegaly present.  Cardiovascular: Normal rate, regular rhythm and normal heart sounds.  Exam reveals no gallop and no friction rub.   No murmur heard. Pulmonary/Chest: Effort normal and breath sounds normal. No respiratory distress. She has no wheezes. She has no rales. She exhibits no tenderness.  Abdominal: Soft. Bowel sounds are normal. She exhibits no distension and no mass. There is no tenderness. There is no rebound and no guarding.  Musculoskeletal: Normal range of motion. She exhibits no edema, tenderness or deformity.  Lymphadenopathy:    She has no cervical adenopathy.  Neurological: She is alert and oriented to person, place, and time.  Skin: Skin is warm and dry. No rash noted. She is not diaphoretic. No erythema. No pallor.  Psychiatric: She has a normal mood and affect. Her behavior is normal. Judgment and thought content normal.  Vitals reviewed.   Lab Results  Component Value Date   WBC 6.7  08/24/2016   HGB 13.4 08/24/2016   HCT 38.5 08/24/2016   PLT 183.0 08/24/2016   GLUCOSE 91 08/24/2016   CHOL 147 08/24/2016   TRIG 64.0 08/24/2016   HDL 58.30 08/24/2016   LDLCALC 76 08/24/2016   ALT 17 08/24/2016   AST 17 08/24/2016   NA 138 08/24/2016   K 4.2 08/24/2016   CL 104 08/24/2016   CREATININE 0.83 08/24/2016   BUN 13 08/24/2016   CO2 28 08/24/2016   TSH 3.59 08/24/2016    Mm Digital Screening  Result Date: 01/11/2013 CLINICAL DATA:  Screening. EXAM: DIGITAL SCREENING BILATERAL MAMMOGRAM WITH CAD COMPARISON:  Previous exam(s). ACR Breast Density Category b: There are scattered areas of fibroglandular density. FINDINGS: There are no findings suspicious for malignancy. Images were processed with CAD. IMPRESSION: No mammographic evidence of malignancy. A result letter of this screening mammogram will be mailed directly to the patient. RECOMMENDATION: Screening mammogram in one year. (Code:SM-B-01Y) BI-RADS CATEGORY  1: Negative Electronically Signed   By: Shon Hale M.D.   On: 01/11/2013 16:16    Assessment & Plan:   [name] was seen today for annual exam.  Diagnoses and all orders for this visit:  Atherosclerosis of native coronary artery of native heart without angina pectoris- she has had no recent episodes of angina, will continue risk factor modification with blood pressure control, statin therapy, and aspirin therapy. -     Lipid panel; Future  Hyperlipidemia LDL goal <70- she has achieved her LDL goal is doing well on the statin. -     Lipid panel; Future -     TSH; Future  CKD (chronic kidney disease) stage 3, GFR 30-59 ml/min- her renal function is stable, will continue to get good blood pressure control, she will avoid nephrotoxic agents. -     Comprehensive metabolic panel; Future -     CBC with Differential/Platelet; Future -     Urinalysis, Routine w reflex microscopic; Future  Routine general medical examination at a health care facility- exam  completed, labs ordered and reviewed, vaccines reviewed, her screening for colon and breast cancer is up-to-date, patient education material was given.   I have discontinued Ms. Seif's metoprolol tartrate. I am also having her maintain her aspirin, nitroGLYCERIN, atorvastatin, and amLODipine.  No orders of the defined types were placed in this encounter.    Follow-up: Return in about 6 months (around 02/22/2017).  Scarlette Calico, MD

## 2016-08-23 NOTE — Patient Instructions (Signed)

## 2016-08-24 ENCOUNTER — Other Ambulatory Visit (INDEPENDENT_AMBULATORY_CARE_PROVIDER_SITE_OTHER): Payer: 59

## 2016-08-24 ENCOUNTER — Encounter: Payer: Self-pay | Admitting: Internal Medicine

## 2016-08-24 DIAGNOSIS — N183 Chronic kidney disease, stage 3 unspecified: Secondary | ICD-10-CM

## 2016-08-24 DIAGNOSIS — E785 Hyperlipidemia, unspecified: Secondary | ICD-10-CM

## 2016-08-24 DIAGNOSIS — I2510 Atherosclerotic heart disease of native coronary artery without angina pectoris: Secondary | ICD-10-CM | POA: Diagnosis not present

## 2016-08-24 LAB — COMPREHENSIVE METABOLIC PANEL
ALT: 17 U/L (ref 0–35)
AST: 17 U/L (ref 0–37)
Albumin: 4.2 g/dL (ref 3.5–5.2)
Alkaline Phosphatase: 96 U/L (ref 39–117)
BILIRUBIN TOTAL: 0.9 mg/dL (ref 0.2–1.2)
BUN: 13 mg/dL (ref 6–23)
CALCIUM: 9.3 mg/dL (ref 8.4–10.5)
CHLORIDE: 104 meq/L (ref 96–112)
CO2: 28 meq/L (ref 19–32)
CREATININE: 0.83 mg/dL (ref 0.40–1.20)
GFR: 72.03 mL/min (ref >60.00)
GLUCOSE: 91 mg/dL (ref 70–99)
Potassium: 4.2 mEq/L (ref 3.5–5.1)
SODIUM: 138 meq/L (ref 135–145)
Total Protein: 6.5 g/dL (ref 6.0–8.3)

## 2016-08-24 LAB — LIPID PANEL
CHOL/HDL RATIO: 3
Cholesterol: 147 mg/dL (ref 0–200)
HDL: 58.3 mg/dL (ref >39.00)
LDL CALC: 76 mg/dL (ref 0–99)
NONHDL: 88.87
TRIGLYCERIDES: 64 mg/dL (ref 0.0–149.0)
VLDL: 12.8 mg/dL (ref 0.0–40.0)

## 2016-08-24 LAB — CBC WITH DIFFERENTIAL/PLATELET
BASOS ABS: 0.1 10*3/uL (ref 0.0–0.1)
BASOS PCT: 1.2 % (ref 0.0–3.0)
EOS ABS: 0.4 10*3/uL (ref 0.0–0.7)
Eosinophils Relative: 5.4 % — ABNORMAL HIGH (ref 0.0–5.0)
HEMATOCRIT: 38.5 % (ref 36.0–46.0)
Hemoglobin: 13.4 g/dL (ref 12.0–15.0)
LYMPHS PCT: 18.1 % (ref 12.0–46.0)
Lymphs Abs: 1.2 10*3/uL (ref 0.7–4.0)
MCHC: 34.8 g/dL (ref 30.0–36.0)
MCV: 92.2 fl (ref 78.0–100.0)
MONO ABS: 0.5 10*3/uL (ref 0.1–1.0)
Monocytes Relative: 7.3 % (ref 3.0–12.0)
NEUTROS ABS: 4.6 10*3/uL (ref 1.4–7.7)
Neutrophils Relative %: 68 % (ref 43.0–77.0)
Platelets: 183 10*3/uL (ref 150.0–400.0)
RBC: 4.17 Mil/uL (ref 3.87–5.11)
RDW: 12.8 % (ref 11.5–15.5)
WBC: 6.7 10*3/uL (ref 4.0–10.5)

## 2016-08-24 LAB — URINALYSIS, ROUTINE W REFLEX MICROSCOPIC
BILIRUBIN URINE: NEGATIVE
Ketones, ur: NEGATIVE
NITRITE: NEGATIVE
RBC / HPF: NONE SEEN (ref 0–?)
Specific Gravity, Urine: <=1.005 — AB (ref 1.000–1.030)
Total Protein, Urine: NEGATIVE
URINE GLUCOSE: NEGATIVE
Urobilinogen, UA: 0.2 (ref 0.0–1.0)
pH: 6 (ref 5.0–8.0)

## 2016-08-24 LAB — TSH: TSH: 3.59 u[IU]/mL (ref 0.35–4.50)

## 2016-09-09 MED FILL — ATORVASTATIN 40 MG TABLET: 40 | 90 days supply | Qty: 90 | Fill #3

## 2016-09-15 NOTE — Progress Notes (Signed)
Cardiology Office Note   Date:  09/16/2016   ID:  Karen, Hughes Mar 17, 1945, MRN [id number]  PCP:  [name], MD  Cardiologist:   Dorris Carnes, MD   F/U of CAD     History of Present Illness: [name] is a 71 y.o. female with a history of CAD She is s/p CABG in 2005 (LIMA to LAD; SVG to PDA; SVG to Diag). Also a history of tob use, HTN.  She had a myoview in 2006 that showed normal perfusion I saw the patient in June 2017    Pt says she is  walking 20 min per day   No CP  NO SOB  No dizziness Taking lipitor in AM    Lipids in June HDL was 58 LDL was 76 Current Meds  Medication Sig  . amLODipine (NORVASC) 2.5 MG tablet TAKE 1 TABLET BY MOUTH DAILY.  Marland Kitchen aspirin 81 MG tablet Take 81 mg by mouth daily.  Marland Kitchen atorvastatin (LIPITOR) 40 MG tablet TAKE 1 TABLET BY MOUTH ONCE DAILY  . nitroGLYCERIN (NITROSTAT) 0.4 MG SL tablet Place 0.4 mg under the tongue every 5 (five) minutes as needed for chest pain (X3 DOSES BEFORE CALLING 911).     Allergies:   Patient has no known allergies.   Past Medical History:  Diagnosis Date  . CAD (coronary artery disease)   . Cervical cancer ([place])    Dr Ulanda Edison  . Hyperlipidemia     Past Surgical History:  Procedure Laterality Date  . CORONARY ARTERY BYPASS GRAFT       Social History:  The patient  reports that she quit smoking about 31 years ago. She has never used smokeless tobacco. She reports that she does not drink alcohol or use drugs.   Family History:  The patient's family history includes Coronary artery disease in her other; Diabetes in her other; Heart attack in her father; Hypertension in her mother and other.    ROS:  Please see the history of present illness. All other systems are reviewed and  Negative to the above problem except as noted.    PHYSICAL EXAM: VS:  BP 132/60 (BP Location: Left Arm, Patient Position: Sitting, Cuff Size: Normal)   Pulse 60   Ht 5\' 4"  (1.626 m)   Wt 64.4 kg (142 lb)   SpO2  97%   BMI 24.37 kg/m   GEN: Well nourished, well developed, in no acute distress  HEENT: normal  Neck: no JVD, carotid bruits, or masses Cardiac: RRR; no murmurs, rubs, or gallops,no edema  Respiratory:  clear to auscultation bilaterally, normal work of breathing GI: soft, nontender, nondistended, + BS  No hepatomegaly  MS: no deformity Moving all extremities   Skin: warm and dry, no rash Neuro:  Strength and sensation are intact Psych: euthymic mood, full affect   EKG:  EKG is ordered today.  SR 60 bpm     Lipid Panel    Component Value Date/Time   CHOL 147 08/24/2016 0724   TRIG 64.0 08/24/2016 0724   HDL 58.30 08/24/2016 0724   CHOLHDL 3 08/24/2016 0724   VLDL 12.8 08/24/2016 0724   LDLCALC 76 08/24/2016 0724      Wt Readings from Last 3 Encounters:  09/16/16 64.4 kg (142 lb)  08/23/16 65.1 kg (143 lb 8 oz)  08/15/16 64.9 kg (143 lb)      ASSESSMENT AND PLAN:  1  CAD   I am not convinced of active ischemia  Spoke at length with th pt re walking, getting HR up  Encouraged her to be more active  2  HL  Conitnue statin but take at night  Check lipids in 4 months  May get better control  Goal less than 70  3  HTN  BP is good    F/U in 1 year in clinic  Sooner with labs      Current medicines are reviewed at length with the patient today.  The patient does not have concerns regarding medicines.  Signed, Dorris Carnes, MD  09/16/2016 8:48 AM    [address], [place], [place]  [address] Phone: [phone]; Fax: [phone]

## 2016-09-16 ENCOUNTER — Encounter: Payer: Self-pay | Admitting: Internal Medicine

## 2016-09-16 ENCOUNTER — Ambulatory Visit (INDEPENDENT_AMBULATORY_CARE_PROVIDER_SITE_OTHER): Payer: 59 | Admitting: Internal Medicine

## 2016-09-16 VITALS — BP 132/60 | HR 60 | Ht 64.0 in | Wt 142.0 lb

## 2016-09-16 DIAGNOSIS — E782 Mixed hyperlipidemia: Secondary | ICD-10-CM | POA: Diagnosis not present

## 2016-09-16 DIAGNOSIS — I25810 Atherosclerosis of coronary artery bypass graft(s) without angina pectoris: Secondary | ICD-10-CM | POA: Diagnosis not present

## 2016-09-16 DIAGNOSIS — I2510 Atherosclerotic heart disease of native coronary artery without angina pectoris: Secondary | ICD-10-CM

## 2016-09-16 DIAGNOSIS — I10 Essential (primary) hypertension: Secondary | ICD-10-CM | POA: Diagnosis not present

## 2016-09-16 MED ORDER — NITROGLYCERIN 0.4 MG SL SUBL: 0.4000 mg | SUBLINGUAL_TABLET | SUBLINGUAL | 6 refills | Status: DC | PRN

## 2016-09-16 MED FILL — NITROGLYCERIN 0.4 MG TAB SL: 0.4 | 15 days supply | Qty: 25 | Fill #0

## 2016-09-16 NOTE — Patient Instructions (Addendum)
Medication Instructions:   Your physician recommends that you continue on your current medications as directed. Please refer to the Current Medication list given to you today.  - If you need a refill on your cardiac medications before your next appointment, please call your pharmacy.   Labwork:  Your physician recommends that you return for lab work in: 4 months for lipid profile    ----  YOU WILL NEED TO BE FASTING FOR THIS BLOOD WORK ----  Testing/Procedures:  None ordered  Follow-Up:  Your physician wants you to follow-up in: 1 year with Dr. Harrington Challenger.  You will receive a reminder letter in the mail two months in advance. If you don't receive a letter, please call our office to schedule the follow-up appointment.  Thank you for choosing [facility]!!

## 2016-09-20 ENCOUNTER — Other Ambulatory Visit: Payer: Self-pay | Admitting: Internal Medicine

## 2016-09-20 MED FILL — AMLODIPINE BESYLATE 2.5 MG: 2.5 | 90 days supply | Qty: 90 | Fill #0

## 2016-12-19 MED FILL — AMLODIPINE BESYLATE 2.5 MG: 2.5 | 90 days supply | Qty: 90 | Fill #1

## 2016-12-20 ENCOUNTER — Other Ambulatory Visit: Payer: Self-pay | Admitting: Internal Medicine

## 2016-12-20 MED FILL — ATORVASTATIN 40 MG TABLET: 40 | 90 days supply | Qty: 90 | Fill #0

## 2017-01-16 ENCOUNTER — Other Ambulatory Visit: Payer: 59

## 2017-02-06 DIAGNOSIS — Z1389 Encounter for screening for other disorder: Secondary | ICD-10-CM | POA: Diagnosis not present

## 2017-02-06 DIAGNOSIS — Z1231 Encounter for screening mammogram for malignant neoplasm of breast: Secondary | ICD-10-CM | POA: Diagnosis not present

## 2017-02-06 DIAGNOSIS — Z124 Encounter for screening for malignant neoplasm of cervix: Secondary | ICD-10-CM | POA: Diagnosis not present

## 2017-02-06 DIAGNOSIS — Z01419 Encounter for gynecological examination (general) (routine) without abnormal findings: Secondary | ICD-10-CM | POA: Diagnosis not present

## 2017-02-06 LAB — HM MAMMOGRAPHY: HM Mammogram: NORMAL (ref 0–4)

## 2017-02-06 LAB — HM PAP SMEAR

## 2017-02-07 DIAGNOSIS — Z124 Encounter for screening for malignant neoplasm of cervix: Secondary | ICD-10-CM | POA: Diagnosis not present

## 2017-02-07 DIAGNOSIS — R87619 Unspecified abnormal cytological findings in specimens from cervix uteri: Secondary | ICD-10-CM | POA: Diagnosis not present

## 2017-02-08 ENCOUNTER — Other Ambulatory Visit: Payer: 59 | Admitting: *Deleted

## 2017-02-08 DIAGNOSIS — I2510 Atherosclerotic heart disease of native coronary artery without angina pectoris: Secondary | ICD-10-CM | POA: Diagnosis not present

## 2017-02-08 DIAGNOSIS — I25810 Atherosclerosis of coronary artery bypass graft(s) without angina pectoris: Secondary | ICD-10-CM | POA: Diagnosis not present

## 2017-02-08 LAB — LIPID PANEL
CHOL/HDL RATIO: 2.2 ratio (ref 0.0–4.4)
CHOLESTEROL TOTAL: 154 mg/dL (ref 100–199)
HDL: 71 mg/dL (ref >39)
LDL Calculated: 68 mg/dL (ref 0–99)
TRIGLYCERIDES: 73 mg/dL (ref 0–149)
VLDL Cholesterol Cal: 15 mg/dL (ref 5–40)

## 2017-04-03 MED FILL — AMLODIPINE 2.5 MG TABLET: 2.5 | 90 days supply | Qty: 90 | Fill #2

## 2017-04-03 MED FILL — ATORVASTATIN 40 MG TABLET: 40 | 90 days supply | Qty: 90 | Fill #1

## 2017-05-03 ENCOUNTER — Encounter: Payer: 59 | Admitting: Internal Medicine

## 2017-05-09 ENCOUNTER — Ambulatory Visit (INDEPENDENT_AMBULATORY_CARE_PROVIDER_SITE_OTHER)
Admission: RE | Admit: 2017-05-09 | Discharge: 2017-05-09 | Disposition: A | Discharge status: Home / Self Care | Payer: 59 | Source: Ambulatory Visit | Attending: Internal Medicine | Admitting: Internal Medicine

## 2017-05-09 ENCOUNTER — Encounter: Payer: Self-pay | Admitting: Internal Medicine

## 2017-05-09 ENCOUNTER — Ambulatory Visit (INDEPENDENT_AMBULATORY_CARE_PROVIDER_SITE_OTHER): Payer: 59 | Admitting: Internal Medicine

## 2017-05-09 VITALS — BP 130/70 | HR 68 | Temp 97.7°F | Resp 16 | Ht 64.0 in | Wt 144.2 lb

## 2017-05-09 DIAGNOSIS — Z780 Asymptomatic menopausal state: Secondary | ICD-10-CM | POA: Diagnosis not present

## 2017-05-09 DIAGNOSIS — Z0000 Encounter for general adult medical examination without abnormal findings: Secondary | ICD-10-CM

## 2017-05-09 DIAGNOSIS — N183 Chronic kidney disease, stage 3 unspecified: Secondary | ICD-10-CM

## 2017-05-09 DIAGNOSIS — E785 Hyperlipidemia, unspecified: Secondary | ICD-10-CM

## 2017-05-09 DIAGNOSIS — Z1159 Encounter for screening for other viral diseases: Secondary | ICD-10-CM | POA: Insufficient documentation

## 2017-05-09 DIAGNOSIS — I2510 Atherosclerotic heart disease of native coronary artery without angina pectoris: Secondary | ICD-10-CM | POA: Diagnosis not present

## 2017-05-09 LAB — HM DEXA SCAN: HM Dexa Scan: -1.2

## 2017-05-09 NOTE — Progress Notes (Signed)
Subjective:  Patient ID: [name], female    DOB: September 03, 1945  Age: 72 y.o. MRN: [id number]  CC: Annual Exam; Hyperlipidemia; and Coronary Artery Disease   HPI [name] presents for a CPX.  She feels well and offers no complaints.  She is tolerating the statin well with no muscle or joint aches.  She is active and has had no recent episodes of DOE, CP, shortness of breath, palpitations, edema, or fatigue.  Outpatient Medications Prior to Visit  Medication Sig Dispense Refill  . amLODipine (NORVASC) 2.5 MG tablet TAKE 1 TABLET BY MOUTH DAILY. 90 tablet 3  . aspirin 81 MG tablet Take 81 mg by mouth daily.    Marland Kitchen atorvastatin (LIPITOR) 40 MG tablet Take 1 tablet (40 mg total) by mouth daily. 90 tablet 3  . nitroGLYCERIN (NITROSTAT) 0.4 MG SL tablet Place 1 tablet (0.4 mg total) under the tongue every 5 (five) minutes as needed for chest pain (X3 DOSES BEFORE CALLING 911). (Patient not taking: Reported on 05/09/2017) 25 tablet 6   No facility-administered medications prior to visit.     ROS Review of Systems  Constitutional: Negative.  Negative for appetite change, diaphoresis, fatigue and unexpected weight change.  HENT: Negative.  Negative for trouble swallowing.   Eyes: Negative for visual disturbance.  Respiratory: Negative for cough, chest tightness, shortness of breath and wheezing.   Cardiovascular: Negative for chest pain, palpitations and leg swelling.  Gastrointestinal: Negative for abdominal pain, constipation, diarrhea, nausea and vomiting.  Endocrine: Negative.   Genitourinary: Negative.  Negative for decreased urine volume, difficulty urinating, frequency, urgency and vaginal bleeding.  Musculoskeletal: Negative.  Negative for arthralgias, back pain and myalgias.  Skin: Negative.  Negative for color change and pallor.  Allergic/Immunologic: Negative.   Neurological: Negative.  Negative for dizziness, weakness and light-headedness.  Hematological: Negative for  adenopathy. Does not bruise/bleed easily.  Psychiatric/Behavioral: Negative.     Objective:  BP 130/70 (BP Location: Left Arm, Patient Position: Sitting, Cuff Size: Normal)   Pulse 68   Temp 97.7 F (36.5 C) (Oral)   Resp 16   Ht 5\' 4"  (1.626 m)   Wt 144 lb 4 oz (65.4 kg)   SpO2 98%   BMI 24.76 kg/m   BP Readings from Last 3 Encounters:  05/09/17 130/70  09/16/16 132/60  08/23/16 132/74    Wt Readings from Last 3 Encounters:  05/09/17 144 lb 4 oz (65.4 kg)  09/16/16 142 lb (64.4 kg)  08/23/16 143 lb 8 oz (65.1 kg)    Physical Exam  Constitutional: She is oriented to person, place, and time. No distress.  HENT:  Mouth/Throat: Oropharynx is clear and moist. No oropharyngeal exudate.  Eyes: Conjunctivae are normal. Left eye exhibits no discharge. No scleral icterus.  Neck: Normal range of motion. Neck supple. No JVD present. No thyromegaly present.  Cardiovascular: Normal rate, regular rhythm and normal heart sounds. Exam reveals no gallop.  No murmur heard. Pulmonary/Chest: Effort normal and breath sounds normal. No respiratory distress. She has no wheezes. She has no rales.  Abdominal: Soft. Bowel sounds are normal. She exhibits no distension and no mass. There is no tenderness. There is no guarding.  Genitourinary:  Genitourinary Comments: Breast, GU, rectal exams were deferred at her request.  Musculoskeletal: Normal range of motion. She exhibits no edema, tenderness or deformity.  Lymphadenopathy:    She has no cervical adenopathy.  Neurological: She is alert and oriented to person, place, and time.  Skin: Skin  is warm and dry. No rash noted. She is not diaphoretic. No erythema. No pallor.  Psychiatric: She has a normal mood and affect. Her behavior is normal. Judgment and thought content normal.  Vitals reviewed.   Lab Results  Component Value Date   WBC 6.7 08/24/2016   HGB 13.4 08/24/2016   HCT 38.5 08/24/2016   PLT 183.0 08/24/2016   GLUCOSE 88 05/10/2017     CHOL 160 05/10/2017   TRIG 68.0 05/10/2017   HDL 70.00 05/10/2017   LDLCALC 77 05/10/2017   ALT 23 05/10/2017   AST 23 05/10/2017   NA 134 (L) 05/10/2017   K 4.1 05/10/2017   CL 100 05/10/2017   CREATININE 0.96 05/10/2017   BUN 15 05/10/2017   CO2 26 05/10/2017   TSH 3.59 08/24/2016    Mm Digital Screening  Result Date: 01/11/2013 CLINICAL DATA:  Screening. EXAM: DIGITAL SCREENING BILATERAL MAMMOGRAM WITH CAD COMPARISON:  Previous exam(s). ACR Breast Density Category b: There are scattered areas of fibroglandular density. FINDINGS: There are no findings suspicious for malignancy. Images were processed with CAD. IMPRESSION: No mammographic evidence of malignancy. A result letter of this screening mammogram will be mailed directly to the patient. RECOMMENDATION: Screening mammogram in one year. (Code:SM-B-01Y) BI-RADS CATEGORY  1: Negative Electronically Signed   By: Shon Hale M.D.   On: 01/11/2013 16:16    Assessment & Plan:   [name] was seen today for annual exam, hyperlipidemia and coronary artery disease.  Diagnoses and all orders for this visit:  Atherosclerosis of native coronary artery of native heart without angina pectoris- She has had no recent episodes of angina.  Will continue risk factor modification with pressure control, statin therapy, and an aspirin a day.  CKD (chronic kidney disease) stage 3, GFR 30-59 ml/min (HCC)- Her renal function is normal.  This diagnosis has been eliminated.  Hyperlipidemia LDL goal <70- She has achieved her LDL goal and is doing well on the statin. -     Comprehensive metabolic panel; Future  Postmenopausal- her bone density shows very mild osteoporosis.  She was encouraged to take vitamin D and calcium supplements. -     DG Bone Density; Future  Routine general medical examination at a health care facility- Exam completed, labs reviewed, vaccines reviewed and updated, Pap/mammogram/colon cancer screening are all up-to-date. -      Lipid panel; Future  Need for hepatitis C screening test -     Hepatitis C antibody; Future   I am having [name] maintain her aspirin, nitroGLYCERIN, amLODipine, and atorvastatin.  No orders of the defined types were placed in this encounter.    Follow-up: No Follow-up on file.  Scarlette Calico, MD

## 2017-05-10 ENCOUNTER — Other Ambulatory Visit (INDEPENDENT_AMBULATORY_CARE_PROVIDER_SITE_OTHER): Payer: 59

## 2017-05-10 ENCOUNTER — Encounter: Payer: Self-pay | Admitting: Internal Medicine

## 2017-05-10 DIAGNOSIS — E785 Hyperlipidemia, unspecified: Secondary | ICD-10-CM

## 2017-05-10 DIAGNOSIS — Z1159 Encounter for screening for other viral diseases: Secondary | ICD-10-CM | POA: Diagnosis not present

## 2017-05-10 DIAGNOSIS — Z0000 Encounter for general adult medical examination without abnormal findings: Secondary | ICD-10-CM

## 2017-05-10 LAB — COMPREHENSIVE METABOLIC PANEL
ALK PHOS: 91 U/L (ref 39–117)
ALT: 23 U/L (ref 0–35)
AST: 23 U/L (ref 0–37)
Albumin: 4.2 g/dL (ref 3.5–5.2)
BUN: 15 mg/dL (ref 6–23)
CHLORIDE: 100 meq/L (ref 96–112)
CO2: 26 meq/L (ref 19–32)
Calcium: 9.5 mg/dL (ref 8.4–10.5)
Creatinine, Ser: 0.96 mg/dL (ref 0.40–1.20)
GFR: 60.77 mL/min (ref >60.00)
GLUCOSE: 88 mg/dL (ref 70–99)
Potassium: 4.1 mEq/L (ref 3.5–5.1)
SODIUM: 134 meq/L — AB (ref 135–145)
TOTAL PROTEIN: 6.6 g/dL (ref 6.0–8.3)
Total Bilirubin: 0.8 mg/dL (ref 0.2–1.2)

## 2017-05-10 LAB — LIPID PANEL
CHOL/HDL RATIO: 2
Cholesterol: 160 mg/dL (ref 0–200)
HDL: 70 mg/dL (ref >39.00)
LDL CALC: 77 mg/dL (ref 0–99)
NonHDL: 90.35
Triglycerides: 68 mg/dL (ref 0.0–149.0)
VLDL: 13.6 mg/dL (ref 0.0–40.0)

## 2017-05-11 ENCOUNTER — Encounter: Payer: Self-pay | Admitting: Internal Medicine

## 2017-05-11 LAB — HEPATITIS C ANTIBODY
HEP C AB: NONREACTIVE
SIGNAL TO CUT-OFF: 0.01 (ref <1.00)

## 2017-05-24 DIAGNOSIS — H524 Presbyopia: Secondary | ICD-10-CM | POA: Diagnosis not present

## 2017-07-07 MED FILL — AMLODIPINE 2.5 MG TABLET: 2.5 | 90 days supply | Qty: 90 | Fill #3

## 2017-08-31 MED FILL — ATORVASTATIN 40 MG TABLET: 40 | 90 days supply | Qty: 90 | Fill #2

## 2017-10-18 ENCOUNTER — Other Ambulatory Visit: Payer: Self-pay | Admitting: Internal Medicine

## 2017-10-18 MED FILL — AMLODIPINE 2.5 MG TABLET: 2.5 | 90 days supply | Qty: 90 | Fill #0

## 2017-11-02 ENCOUNTER — Encounter: Payer: Self-pay | Admitting: Internal Medicine

## 2017-11-24 ENCOUNTER — Encounter: Payer: Self-pay | Admitting: Internal Medicine

## 2017-11-24 ENCOUNTER — Ambulatory Visit (INDEPENDENT_AMBULATORY_CARE_PROVIDER_SITE_OTHER): Payer: 59 | Admitting: Internal Medicine

## 2017-11-24 ENCOUNTER — Encounter

## 2017-11-24 VITALS — BP 146/72 | HR 70 | Ht 64.0 in | Wt 145.0 lb

## 2017-11-24 DIAGNOSIS — I10 Essential (primary) hypertension: Secondary | ICD-10-CM

## 2017-11-24 DIAGNOSIS — I2510 Atherosclerotic heart disease of native coronary artery without angina pectoris: Secondary | ICD-10-CM | POA: Diagnosis not present

## 2017-11-24 MED ORDER — ROSUVASTATIN CALCIUM 20 MG PO TABS: 20.0000 mg | ORAL_TABLET | Freq: Every day | ORAL | 3 refills | Status: DC

## 2017-11-24 MED ORDER — AMLODIPINE BESYLATE 5 MG PO TABS: 5.0000 mg | ORAL_TABLET | Freq: Every day | ORAL | 3 refills | Status: DC

## 2017-11-24 MED FILL — ROSUVASTATIN CALCIUM 20 MG: 20 | 90 days supply | Qty: 90 | Fill #0

## 2017-11-24 NOTE — Progress Notes (Addendum)
Cardiology Office Note   Date:  11/24/2017   ID:  Amran, Malter [birth year], MRN [id number]  PCP:  [name], MD  Cardiologist:   Dorris Carnes, MD   F/U of CAD     History of Present Illness: [name] is a 72 y.o. female with a history of CAD She is s/p CABG in 2005 (LIMA to LAD; SVG to PDA; SVG to Diag). Also a history of tob use, HTN.  She had a myoview in 2006 that showed normal perfusion I saw the patient in July 2018  Since seen the pt has done well   She denies CP   No SOB   Active, walks 5x per wk      BP t home is 150- 160/    Current Meds  Medication Sig  . amLODipine (NORVASC) 2.5 MG tablet Take 1 tablet (2.5 mg total) by mouth daily. Please keep upcoming appt in September with Dr. Harrington Challenger for future refills. Thank you  . aspirin 81 MG tablet Take 81 mg by mouth daily.  [name] Kitchen atorvastatin (LIPITOR) 40 MG tablet Take 1 tablet (40 mg total) by mouth daily.  . nitroGLYCERIN (NITROSTAT) 0.4 MG SL tablet Place 1 tablet (0.4 mg total) under the tongue every 5 (five) minutes as needed for chest pain (X3 DOSES BEFORE CALLING 911).     Allergies:   Patient has no known allergies.   Past Medical History:  Diagnosis Date  . CAD (coronary artery disease)   . Cervical cancer ([place])    Dr Ulanda Edison  . Hyperlipidemia     Past Surgical History:  Procedure Laterality Date  . CORONARY ARTERY BYPASS GRAFT       Social History:  The patient  reports that she quit smoking about 32 years ago. She has never used smokeless tobacco. She reports that she does not drink alcohol or use drugs.   Family History:  The patient's family history includes Coronary artery disease in her other; Diabetes in her other; Heart attack in her father; Hypertension in her mother and other.    ROS:  Please see the history of present illness. All other systems are reviewed and  Negative to the above problem except as noted.    PHYSICAL EXAM: VS:  BP (!) 146/72   Pulse 70   Ht 5\' 4"   (1.626 m)   Wt 145 lb (65.8 kg)   SpO2 95%   BMI 24.89 kg/m   GEN: Well nourished, well developed, in no acute distress  HEENT: normal  Neck: JVP is normal   NO  carotid bruits, or masses Cardiac: RRR; no murmurs, rubs, or gallops,no edema  Respiratory:  clear to auscultation bilaterally, normal work of breathing GI: soft, nontender, nondistended, + BS  No hepatomegaly  MS: no deformity Moving all extremities   Skin: warm and dry, no rash Neuro:  Strength and sensation are intact Psych: euthymic mood, full affect   EKG:  EKG is ordered today.  SU 70 bpm      Lipid Panel    Component Value Date/Time   CHOL 160 05/10/2017 0718   CHOL 154 02/08/2017 0813   TRIG 68.0 05/10/2017 0718   HDL 70.00 05/10/2017 0718   HDL 71 02/08/2017 0813   CHOLHDL 2 05/10/2017 0718   VLDL 13.6 05/10/2017 0718   LDLCALC 77 05/10/2017 0718   LDLCALC 68 02/08/2017 0813      Wt Readings from Last 3 Encounters:  11/24/17 145 lb (65.8  kg)  05/09/17 144 lb 4 oz (65.4 kg)  09/16/16 142 lb (64.4 kg)      ASSESSMENT AND PLAN:  1  CAD   No symptoms of angina   Active   2  HL  LDL could be better   WOuld switch to 20 Crestor    3  HTN  BP is up more than it should be  Add amlodipine 5 mg    F/U in 6 to 8 wks       Current medicines are reviewed at length with the patient today.  The patient does not have concerns regarding medicines.  Signed, Dorris Carnes, MD  11/24/2017 3:33 PM    [address], [place], [place]  [address] Phone: [phone]; Fax: [phone]

## 2017-11-24 NOTE — Patient Instructions (Signed)
Your physician has recommended you make the following change in your medication:   1.) increase amlodipine to 5 mg once a day for blood pressure 2.) stop atorvastatin 3.) start rosuvastatin (Crestor) 20 mg for cholesterol  Your physician recommends that you schedule a follow-up appointment in: 6-8 weeks with Dr. Harrington Challenger.  We will check your cholesterol at that time also.

## 2017-12-20 MED FILL — AMLODIPINE BESYLATE 5 MG TA: 5 | 90 days supply | Qty: 90 | Fill #0

## 2018-02-02 DIAGNOSIS — H353131 Nonexudative age-related macular degeneration, bilateral, early dry stage: Secondary | ICD-10-CM | POA: Diagnosis not present

## 2018-02-02 DIAGNOSIS — H2513 Age-related nuclear cataract, bilateral: Secondary | ICD-10-CM | POA: Diagnosis not present

## 2018-02-02 DIAGNOSIS — H02839 Dermatochalasis of unspecified eye, unspecified eyelid: Secondary | ICD-10-CM | POA: Diagnosis not present

## 2018-02-02 DIAGNOSIS — H18413 Arcus senilis, bilateral: Secondary | ICD-10-CM | POA: Diagnosis not present

## 2018-02-07 DIAGNOSIS — Z13820 Encounter for screening for osteoporosis: Secondary | ICD-10-CM | POA: Diagnosis not present

## 2018-02-07 DIAGNOSIS — Z01419 Encounter for gynecological examination (general) (routine) without abnormal findings: Secondary | ICD-10-CM | POA: Diagnosis not present

## 2018-02-07 DIAGNOSIS — Z130 Encounter for screening for diseases of the blood and blood-forming organs and certain disorders involving the immune mechanism: Secondary | ICD-10-CM | POA: Diagnosis not present

## 2018-02-07 DIAGNOSIS — Z1389 Encounter for screening for other disorder: Secondary | ICD-10-CM | POA: Diagnosis not present

## 2018-02-13 DIAGNOSIS — Z1231 Encounter for screening mammogram for malignant neoplasm of breast: Secondary | ICD-10-CM | POA: Diagnosis not present

## 2018-03-02 DIAGNOSIS — H2511 Age-related nuclear cataract, right eye: Secondary | ICD-10-CM | POA: Diagnosis not present

## 2018-03-02 DIAGNOSIS — H25043 Posterior subcapsular polar age-related cataract, bilateral: Secondary | ICD-10-CM | POA: Diagnosis not present

## 2018-03-02 DIAGNOSIS — H25013 Cortical age-related cataract, bilateral: Secondary | ICD-10-CM | POA: Diagnosis not present

## 2018-03-02 DIAGNOSIS — H2512 Age-related nuclear cataract, left eye: Secondary | ICD-10-CM | POA: Diagnosis not present

## 2018-03-02 MED FILL — BESIVANCE 0.6% SUSP: 0.6 | 30 days supply | Qty: 5 | Fill #0

## 2018-03-02 MED FILL — PROLENSA 0.07% EYE DROPS: 0.07 | 28 days supply | Qty: 3 | Fill #0

## 2018-03-02 MED FILL — AMLODIPINE BESYLATE 5 MG TA: 5 | 90 days supply | Qty: 90 | Fill #1

## 2018-03-02 MED FILL — DUREZOL 0.05% EYE DROPS: 0.05 | 30 days supply | Qty: 5 | Fill #0

## 2018-03-05 MED FILL — ROSUVASTATIN CALCIUM 20 MG: 20 | 90 days supply | Qty: 90 | Fill #1

## 2018-03-13 DIAGNOSIS — H25043 Posterior subcapsular polar age-related cataract, bilateral: Secondary | ICD-10-CM | POA: Diagnosis not present

## 2018-03-13 DIAGNOSIS — H25013 Cortical age-related cataract, bilateral: Secondary | ICD-10-CM | POA: Diagnosis not present

## 2018-03-13 DIAGNOSIS — H2512 Age-related nuclear cataract, left eye: Secondary | ICD-10-CM | POA: Diagnosis not present

## 2018-03-13 DIAGNOSIS — H2511 Age-related nuclear cataract, right eye: Secondary | ICD-10-CM | POA: Diagnosis not present

## 2018-03-16 DIAGNOSIS — H2511 Age-related nuclear cataract, right eye: Secondary | ICD-10-CM | POA: Diagnosis not present

## 2018-03-16 DIAGNOSIS — H2512 Age-related nuclear cataract, left eye: Secondary | ICD-10-CM | POA: Diagnosis not present

## 2018-03-26 MED FILL — DUREZOL 0.05% EYE DROPS: 0.05 | 30 days supply | Qty: 5 | Fill #0

## 2018-03-26 MED FILL — PROLENSA 0.07% EYE DROPS: 0.07 | 60 days supply | Qty: 3 | Fill #0

## 2018-03-26 MED FILL — BESIVANCE 0.6% SUSP: 0.6 | 30 days supply | Qty: 5 | Fill #0

## 2018-04-04 DIAGNOSIS — H2512 Age-related nuclear cataract, left eye: Secondary | ICD-10-CM | POA: Diagnosis not present

## 2018-05-02 DIAGNOSIS — Z961 Presence of intraocular lens: Secondary | ICD-10-CM | POA: Diagnosis not present

## 2018-06-01 MED FILL — AMLODIPINE BESYLATE 5 MG TA: 5 | 90 days supply | Qty: 90 | Fill #2

## 2018-07-12 ENCOUNTER — Telehealth: Payer: Self-pay

## 2018-07-12 NOTE — Telephone Encounter (Signed)
Please use WORK phone number for her PHONE visit.      Virtual Visit Pre-Appointment Phone Call  "(Name), I am calling you today to discuss your upcoming appointment. We are currently trying to limit exposure to the virus that causes COVID-19 by seeing patients at home rather than in the office."  1. "What is the BEST phone number to call the day of the visit?" - include this in appointment notes  2. Do you have or have access to (through a family member/friend) a smartphone with video capability that we can use for your visit?" a. If yes - list this number in appt notes as cell (if different from BEST phone #) and list the appointment type as a VIDEO visit in appointment notes b. If no - list the appointment type as a PHONE visit in appointment notes  3. Confirm consent - "In the setting of the current Covid19 crisis, you are scheduled for a PHONE visit with your provider on 4/7 at 2pm.  Just as we do with many in-office visits, in order for you to participate in this visit, we must obtain consent.  If you'd like, I can send this to your mychart (if signed up) or email for you to review.  Otherwise, I can obtain your verbal consent now.  All virtual visits are billed to your insurance company just like a normal visit would be.  By agreeing to a virtual visit, we'd like you to understand that the technology does not allow for your provider to perform an examination, and thus may limit your provider's ability to fully assess your condition. If your provider identifies any concerns that need to be evaluated in person, we will make arrangements to do so.  Finally, though the technology is pretty good, we cannot assure that it will always work on either your or our end, and in the setting of a video visit, we may have to convert it to a phone-only visit.  In either situation, we cannot ensure that we have a secure connection.  Are you willing to proceed?" STAFF: Did the patient verbally acknowledge  consent to telehealth visit? Document YES/NO here: YES  4. Advise patient to be prepared - "Two hours prior to your appointment, go ahead and check your blood pressure, pulse, oxygen saturation, and your weight (if you have the equipment to check those) and write them all down. When your visit starts, your provider will ask you for this information. If you have an Apple Watch or Kardia device, please plan to have heart rate information ready on the day of your appointment. Please have a pen and paper handy nearby the day of the visit as well."  5. Give patient instructions for MyChart download to smartphone OR [email or web address] as below if video visit (depending on what platform provider is using)  6. Inform patient they will receive a phone call 15 minutes prior to their appointment time (may be from unknown caller ID) so they should be prepared to answer    TELEPHONE CALL NOTE  MELIZZA KANODE has been deemed a candidate for a follow-up tele-health visit to limit community exposure during the Covid-19 pandemic. I spoke with the patient via phone to ensure availability of phone/video source, confirm preferred email & phone number, and discuss instructions and expectations.  I reminded SHEIKA COUTTS to be prepared with any vital sign and/or heart rhythm information that could potentially be obtained via home monitoring, at the time of her visit. I reminded  Misty Stanley Ream to expect a phone call prior to her visit.  Wilma Flavin, RN 07/12/2018 9:35 AM

## 2018-07-19 ENCOUNTER — Other Ambulatory Visit: Payer: Self-pay

## 2018-07-19 ENCOUNTER — Encounter: Payer: Self-pay | Admitting: Internal Medicine

## 2018-07-19 ENCOUNTER — Telehealth: Payer: 59 | Admitting: Internal Medicine

## 2018-07-19 VITALS — BP 143/60 | Ht 64.0 in | Wt 143.0 lb

## 2018-07-19 DIAGNOSIS — I2510 Atherosclerotic heart disease of native coronary artery without angina pectoris: Secondary | ICD-10-CM

## 2018-07-19 NOTE — Progress Notes (Signed)
Virtual Visit via Telephone Note   This visit type was conducted due to national recommendations for restrictions regarding the COVID-19 Pandemic (e.g. social distancing) in an effort to limit this patient's exposure and mitigate transmission in our community.  Due to her co-morbid illnesses, this patient is at least at moderate risk for complications without adequate follow up.  This format is felt to be most appropriate for this patient at this time.  The patient did not have access to video technology/had technical difficulties with video requiring transitioning to audio format only (telephone).  All issues noted in this document were discussed and addressed.  No physical exam could be performed with this format.  Please refer to the patient's chart for her  consent to telehealth for [facility].   Date:  07/19/2018   ID:  Karen, Hughes 01-Feb-1946, MRN [id number]  Patient Location: Home Provider Location: Home  PCP:  [name], MD  Cardiologist:  Dorris Carnes, MD  Electrophysiologist:  None   Evaluation Performed:  Follow-Up Visit  Chief Complaint:  Hx of CAD   History of Present Illness:    [name] is a 73 y.o. female with Hx of CAD (s/p CABG in 2005 (LIMA to LAD; SVG to PDA; SVG to Diag)  ALos hx of HTN and tobacco abuse. I saw her in September 2019   Unable to reach patient by phone despite multiple attempts    Past Medical History:  Diagnosis Date  . CAD (coronary artery disease)   . Cervical cancer ([place])    Dr Ulanda Edison  . Hyperlipidemia    Past Surgical History:  Procedure Laterality Date  . CORONARY ARTERY BYPASS GRAFT       Current Meds  Medication Sig  . amLODipine (NORVASC) 5 MG tablet Take 1 tablet (5 mg total) by mouth daily.  Marland Kitchen aspirin 81 MG tablet Take 81 mg by mouth daily.  . nitroGLYCERIN (NITROSTAT) 0.4 MG SL tablet Place 1 tablet (0.4 mg total) under the tongue every 5 (five) minutes as needed for chest pain (X3 DOSES BEFORE CALLING  911).  . rosuvastatin (CRESTOR) 20 MG tablet Take 1 tablet (20 mg total) by mouth daily.     Allergies:   Patient has no known allergies.   Social History   Tobacco Use  . Smoking status: Former Smoker    Last attempt to quit: 03/14/1985    Years since quitting: 33.3  . Smokeless tobacco: Never Used  Substance Use Topics  . Alcohol use: No  . Drug use: No     Family Hx: The patient's family history includes Coronary artery disease in an other family member; Diabetes in an other family member; Heart attack in her father; Hypertension in her mother and another family member. There is no history of Cancer, Stroke, or Hyperlipidemia.  ROS:   Please see the history of present illness.    All other systems reviewed and are negative.   Prior CV studies:   The following studies were reviewed today:   Labs/Other Tests and Data Reviewed:    EKG:    Recent Labs: No results found for requested labs within last 8760 hours.   Recent Lipid Panel Lab Results  Component Value Date/Time   CHOL 160 05/10/2017 07:18 AM   CHOL 154 02/08/2017 08:13 AM   TRIG 68.0 05/10/2017 07:18 AM   HDL 70.00 05/10/2017 07:18 AM   HDL 71 02/08/2017 08:13 AM   CHOLHDL 2 05/10/2017 07:18 AM  LDLCALC 77 05/10/2017 07:18 AM   LDLCALC 68 02/08/2017 08:13 AM    Wt Readings from Last 3 Encounters:  07/19/18 143 lb (64.9 kg)  11/24/17 145 lb (65.8 kg)  05/09/17 144 lb 4 oz (65.4 kg)     Objective:    Vital Signs:  BP (!) 143/60 Comment: 07/18/2018  Ht 5\' 4"  (1.626 m)   Wt 143 lb (64.9 kg)   BMI 24.55 kg/m      ASSESSMENT & PLAN:    Unable to reach patient by phone   CMA had reviewed VS      WIll need to reschedule for another day    Signed, Dorris Carnes, MD  07/19/2018 1:59 PM    [place]

## 2018-09-02 MED FILL — AMLODIPINE BESYLATE 5 MG TA: 5 | 90 days supply | Qty: 90 | Fill #3

## 2018-09-03 MED FILL — ROSUVASTATIN CALCIUM 20 MG: 20 | 90 days supply | Qty: 90 | Fill #2

## 2018-09-04 ENCOUNTER — Encounter: Payer: Self-pay | Admitting: Internal Medicine

## 2018-09-04 ENCOUNTER — Ambulatory Visit (INDEPENDENT_AMBULATORY_CARE_PROVIDER_SITE_OTHER): Payer: 59 | Admitting: Internal Medicine

## 2018-09-04 ENCOUNTER — Other Ambulatory Visit: Payer: Self-pay

## 2018-09-04 ENCOUNTER — Other Ambulatory Visit (INDEPENDENT_AMBULATORY_CARE_PROVIDER_SITE_OTHER): Payer: 59

## 2018-09-04 VITALS — BP 160/64 | HR 66 | Temp 98.4°F | Resp 16 | Ht 64.0 in | Wt 147.2 lb

## 2018-09-04 DIAGNOSIS — I10 Essential (primary) hypertension: Secondary | ICD-10-CM

## 2018-09-04 DIAGNOSIS — E785 Hyperlipidemia, unspecified: Secondary | ICD-10-CM

## 2018-09-04 DIAGNOSIS — Z0000 Encounter for general adult medical examination without abnormal findings: Secondary | ICD-10-CM

## 2018-09-04 DIAGNOSIS — E559 Vitamin D deficiency, unspecified: Secondary | ICD-10-CM

## 2018-09-04 DIAGNOSIS — Z1231 Encounter for screening mammogram for malignant neoplasm of breast: Secondary | ICD-10-CM

## 2018-09-04 DIAGNOSIS — I2510 Atherosclerotic heart disease of native coronary artery without angina pectoris: Secondary | ICD-10-CM | POA: Diagnosis not present

## 2018-09-04 LAB — URINALYSIS, ROUTINE W REFLEX MICROSCOPIC
Bilirubin Urine: NEGATIVE
Hgb urine dipstick: NEGATIVE
Ketones, ur: NEGATIVE
Leukocytes,Ua: NEGATIVE
Nitrite: NEGATIVE
RBC / HPF: NONE SEEN (ref 0–?)
Specific Gravity, Urine: <=1.005 — AB (ref 1.000–1.030)
Total Protein, Urine: NEGATIVE
Urine Glucose: NEGATIVE
Urobilinogen, UA: 0.2 (ref 0.0–1.0)
pH: 6.5 (ref 5.0–8.0)

## 2018-09-04 LAB — COMPREHENSIVE METABOLIC PANEL
ALT: 23 U/L (ref 0–35)
AST: 23 U/L (ref 0–37)
Albumin: 4.4 g/dL (ref 3.5–5.2)
Alkaline Phosphatase: 86 U/L (ref 39–117)
BUN: 13 mg/dL (ref 6–23)
CO2: 24 mEq/L (ref 19–32)
Calcium: 9.3 mg/dL (ref 8.4–10.5)
Chloride: 102 mEq/L (ref 96–112)
Creatinine, Ser: 0.9 mg/dL (ref 0.40–1.20)
GFR: 61.37 mL/min (ref >60.00)
Glucose, Bld: 99 mg/dL (ref 70–99)
Potassium: 4.7 mEq/L (ref 3.5–5.1)
Sodium: 135 mEq/L (ref 135–145)
Total Bilirubin: 0.8 mg/dL (ref 0.2–1.2)
Total Protein: 6.7 g/dL (ref 6.0–8.3)

## 2018-09-04 LAB — CBC WITH DIFFERENTIAL/PLATELET
Basophils Absolute: 0.1 10*3/uL (ref 0.0–0.1)
Basophils Relative: 1 % (ref 0.0–3.0)
Eosinophils Absolute: 0.4 10*3/uL (ref 0.0–0.7)
Eosinophils Relative: 4.4 % (ref 0.0–5.0)
HCT: 41.9 % (ref 36.0–46.0)
Hemoglobin: 14.2 g/dL (ref 12.0–15.0)
Lymphocytes Relative: 16.6 % (ref 12.0–46.0)
Lymphs Abs: 1.4 10*3/uL (ref 0.7–4.0)
MCHC: 33.8 g/dL (ref 30.0–36.0)
MCV: 93.8 fl (ref 78.0–100.0)
Monocytes Absolute: 0.5 10*3/uL (ref 0.1–1.0)
Monocytes Relative: 6.2 % (ref 3.0–12.0)
Neutro Abs: 6.2 10*3/uL (ref 1.4–7.7)
Neutrophils Relative %: 71.8 % (ref 43.0–77.0)
Platelets: 170 10*3/uL (ref 150.0–400.0)
RBC: 4.46 Mil/uL (ref 3.87–5.11)
RDW: 12.7 % (ref 11.5–15.5)
WBC: 8.6 10*3/uL (ref 4.0–10.5)

## 2018-09-04 LAB — LIPID PANEL
Cholesterol: 165 mg/dL (ref 0–200)
HDL: 73.7 mg/dL (ref >39.00)
LDL Cholesterol: 74 mg/dL (ref 0–99)
NonHDL: 91.59
Total CHOL/HDL Ratio: 2
Triglycerides: 86 mg/dL (ref 0.0–149.0)
VLDL: 17.2 mg/dL (ref 0.0–40.0)

## 2018-09-04 LAB — VITAMIN D 25 HYDROXY (VIT D DEFICIENCY, FRACTURES): VITD: 22.97 ng/mL — ABNORMAL LOW (ref 30.00–100.00)

## 2018-09-04 LAB — TSH: TSH: 3.29 u[IU]/mL (ref 0.35–4.50)

## 2018-09-04 MED ORDER — ASPIRIN EC 81 MG PO TBEC: 81.0000 mg | DELAYED_RELEASE_TABLET | Freq: Every day | ORAL | 1 refills | Status: DC

## 2018-09-04 MED ORDER — EDARBI 40 MG PO TABS: 1.0000 | ORAL_TABLET | Freq: Every day | ORAL | 1 refills | Status: DC

## 2018-09-04 MED ORDER — NITROGLYCERIN 0.4 MG SL SUBL: 0.4000 mg | SUBLINGUAL_TABLET | SUBLINGUAL | 6 refills | Status: DC | PRN

## 2018-09-04 MED ORDER — CHOLECALCIFEROL 50 MCG (2000 UT) PO TABS: 2.0000 | ORAL_TABLET | Freq: Every day | ORAL | 1 refills | Status: DC

## 2018-09-04 MED FILL — ASPIRIN LOW DOSE 81 MG TBEC: 81 | 90 days supply | Qty: 90 | Fill #0

## 2018-09-04 MED FILL — NITROGLYCERIN 0.4 MG TAB SL: 0.4 | 10 days supply | Qty: 25 | Fill #0

## 2018-09-04 NOTE — Patient Instructions (Signed)

## 2018-09-04 NOTE — Progress Notes (Signed)
Subjective:  Patient ID: [name], female    DOB: [birth year]  Age: 73 y.o. MRN: [id number]  CC: Annual Exam, Hypertension, Hyperlipidemia, and Coronary Artery Disease   HPI INDIRA SORENSON presents for a CPX.  She complains of weight gain but offers no other complaints.  She is active and denies any recent episodes of CP or DOE.  She requests a refill of nitroglycerin though she tells me she has never used it.  Outpatient Medications Prior to Visit  Medication Sig Dispense Refill   amLODipine (NORVASC) 5 MG tablet Take 1 tablet (5 mg total) by mouth daily. 90 tablet 3   rosuvastatin (CRESTOR) 20 MG tablet Take 1 tablet (20 mg total) by mouth daily. 90 tablet 3   aspirin 81 MG tablet Take 81 mg by mouth daily.     nitroGLYCERIN (NITROSTAT) 0.4 MG SL tablet Place 1 tablet (0.4 mg total) under the tongue every 5 (five) minutes as needed for chest pain (X3 DOSES BEFORE CALLING 911). 25 tablet 6   No facility-administered medications prior to visit.     ROS Review of Systems  Constitutional: Positive for unexpected weight change. Negative for diaphoresis and fatigue.  HENT: Negative.  Negative for sore throat and trouble swallowing.   Eyes: Negative for visual disturbance.  Respiratory: Negative for cough, chest tightness, shortness of breath and wheezing.   Cardiovascular: Negative for chest pain, palpitations and leg swelling.  Gastrointestinal: Negative for abdominal pain, constipation, diarrhea, nausea and vomiting.  Endocrine: Negative.   Genitourinary: Negative.  Negative for difficulty urinating.  Musculoskeletal: Negative.  Negative for arthralgias and myalgias.  Skin: Negative.  Negative for color change and pallor.  Neurological: Negative.  Negative for dizziness, weakness, light-headedness and headaches.  Hematological: Negative for adenopathy. Does not bruise/bleed easily.  Psychiatric/Behavioral: Negative.     Objective:  BP (!) 160/64 (BP Location: Left  Arm, Patient Position: Sitting, Cuff Size: Normal)    Pulse 66    Temp 98.4 F (36.9 C) (Oral)    Resp 16    Ht 5\' 4"  (1.626 m)    Wt 147 lb 4 oz (66.8 kg)    SpO2 99%    BMI 25.28 kg/m   BP Readings from Last 3 Encounters:  09/04/18 (!) 160/64  07/19/18 (!) 143/60  11/24/17 (!) 146/72    Wt Readings from Last 3 Encounters:  09/04/18 147 lb 4 oz (66.8 kg)  07/19/18 143 lb (64.9 kg)  11/24/17 145 lb (65.8 kg)    Physical Exam Vitals signs reviewed.  Constitutional:      Appearance: She is not ill-appearing or diaphoretic.  HENT:     Nose: Nose normal. No congestion or rhinorrhea.     Mouth/Throat:     Mouth: Mucous membranes are moist.     Pharynx: No oropharyngeal exudate.  Eyes:     General: No scleral icterus.    Conjunctiva/sclera: Conjunctivae normal.  Neck:     Musculoskeletal: Normal range of motion. No neck rigidity.  Cardiovascular:     Rate and Rhythm: Normal rate and regular rhythm.     Heart sounds: No murmur. No gallop.   Pulmonary:     Effort: Pulmonary effort is normal.     Breath sounds: No stridor. No wheezing, rhonchi or rales.  Abdominal:     General: Abdomen is flat. Bowel sounds are normal. There is no distension.     Palpations: There is no hepatomegaly or splenomegaly.     Tenderness: There is  no abdominal tenderness.  Musculoskeletal: Normal range of motion.     Right lower leg: No edema.     Left lower leg: No edema.  Lymphadenopathy:     Cervical: No cervical adenopathy.  Skin:    General: Skin is warm and dry.  Neurological:     General: No focal deficit present.  Psychiatric:        Mood and Affect: Mood normal.        Behavior: Behavior normal.     Lab Results  Component Value Date   WBC 8.6 09/04/2018   HGB 14.2 09/04/2018   HCT 41.9 09/04/2018   PLT 170.0 09/04/2018   GLUCOSE 99 09/04/2018   CHOL 165 09/04/2018   TRIG 86.0 09/04/2018   HDL 73.70 09/04/2018   LDLCALC 74 09/04/2018   ALT 23 09/04/2018   AST 23 09/04/2018     NA 135 09/04/2018   K 4.7 09/04/2018   CL 102 09/04/2018   CREATININE 0.90 09/04/2018   BUN 13 09/04/2018   CO2 24 09/04/2018   TSH 3.29 09/04/2018    Dg Bone Density  Result Date: 05/09/2017 Date of study: 05/09/2017 Exam: DUAL X-RAY ABSORPTIOMETRY (DXA) FOR BONE MINERAL DENSITY (BMD) Instrument: [employer] Provider: PCP Indication: follow up for low BMD Comparison: none (please note that it is not possible to compare data from different instruments) Clinical data: Pt is a 73 y.o. female without previous history of fracture. On calcium and vitamin D. Results:  Lumbar spine L1-L4 Femoral neck (FN) T-score 0.0 RFN: -1.1 LFN: -1.2 Assessment: the BMD is low according to the [facility] classification for osteoporosis (see below). Fracture risk: moderate FRAX score: 10 year major osteoporotic risk: 9.5 %. 10 year hip fracture risk: 1.2 %. The thresholds for treatment are 20% and 3%, respectively. Comments: the technical quality of the study is good. Evaluation for secondary causes should be considered if clinically indicated. Recommend optimizing calcium (1200 mg/day) and vitamin D (800 IU/day) intake. Followup: Repeat BMD is appropriate after 2 years. WHO criteria for diagnosis of osteoporosis in postmenopausal women and in men 63 y/o or older: - normal: T-score -1.0 to + 1.0 - osteopenia/low bone density: T-score between -2.5 and -1.0 - osteoporosis: T-score below -2.5 - severe osteoporosis: T-score below -2.5 with history of fragility fracture Note: although not part of the WHO classification, the presence of a fragility fracture, regardless of the T-score, should be considered diagnostic of osteoporosis, provided other causes for the fracture have been excluded. Treatment: The National Osteoporosis Foundation recommends that treatment be considered in postmenopausal women and men age 22 or older with: 1. Hip or vertebral (clinical or morphometric) fracture 2. T-score of - 2.5 or lower at  the spine or hip 3. 10-year fracture probability by FRAX of at least 20% for a major osteoporotic fracture and 3% for a hip fracture Philemon Kingdom, MD [facility]    Assessment & Plan:   Shterna was seen today for annual exam, hypertension, hyperlipidemia and coronary artery disease.  Diagnoses and all orders for this visit:  Atherosclerosis of native coronary artery of native heart without angina pectoris- She has had no recent episodes of angina.  Will continue to address risk factor modifications. -     aspirin EC 81 MG tablet; Take 1 tablet (81 mg total) by mouth daily. -     Azilsartan Medoxomil (EDARBI) 40 MG TABS; Take 1 tablet by mouth daily. -     nitroGLYCERIN (NITROSTAT) 0.4 MG SL  tablet; Place 1 tablet (0.4 mg total) under the tongue every 5 (five) minutes as needed for chest pain (X3 DOSES BEFORE CALLING 911).  Routine general medical examination at a health care facility- Exam completed, labs reviewed, vaccines reviewed and updated, Pap is up-to-date, colon cancer screening is up-to-date, she is referred for screening mammogram.  Patient education material was given -     Lipid panel; Future  Hyperlipidemia LDL goal <70-she has achieved her LDL goal and is doing well on the statin. -     Comprehensive metabolic panel; Future -     CBC with Differential/Platelet; Future -     TSH; Future  Essential hypertension- Her blood pressure is not adequately well controlled on the CCB.  I will treat the vitamin D deficiency.  Otherwise her labs are negative for secondary causes or endorgan damage.  I have asked her to add an ARB to the CCB.Marland Kitchen -     Urinalysis, Routine w reflex microscopic; Future -     VITAMIN D 25 Hydroxy (Vit-D Deficiency, Fractures); Future -     Azilsartan Medoxomil (EDARBI) 40 MG TABS; Take 1 tablet by mouth daily.  Vitamin D deficiency disease -     Cholecalciferol 50 MCG (2000 UT) TABS; Take 2 tablets (4,000 Units total) by mouth daily.   I have  discontinued Naliah C. Divelbiss's aspirin. I am also having her start on aspirin EC, Edarbi, and Cholecalciferol. Additionally, I am having her maintain her rosuvastatin, amLODipine, and nitroGLYCERIN.  Meds ordered this encounter  Medications   aspirin EC 81 MG tablet    Sig: Take 1 tablet (81 mg total) by mouth daily.    Dispense:  90 tablet    Refill:  1   Azilsartan Medoxomil (EDARBI) 40 MG TABS    Sig: Take 1 tablet by mouth daily.    Dispense:  90 tablet    Refill:  1   nitroGLYCERIN (NITROSTAT) 0.4 MG SL tablet    Sig: Place 1 tablet (0.4 mg total) under the tongue every 5 (five) minutes as needed for chest pain (X3 DOSES BEFORE CALLING 911).    Dispense:  25 tablet    Refill:  6   Cholecalciferol 50 MCG (2000 UT) TABS    Sig: Take 2 tablets (4,000 Units total) by mouth daily.    Dispense:  180 tablet    Refill:  1     Follow-up: Return in about 3 months (around 12/05/2018).  Scarlette Calico, MD

## 2018-09-05 DIAGNOSIS — Z1231 Encounter for screening mammogram for malignant neoplasm of breast: Secondary | ICD-10-CM | POA: Insufficient documentation

## 2018-09-11 ENCOUNTER — Other Ambulatory Visit: Payer: Self-pay | Admitting: Internal Medicine

## 2018-09-11 DIAGNOSIS — I10 Essential (primary) hypertension: Secondary | ICD-10-CM

## 2018-09-11 DIAGNOSIS — I2510 Atherosclerotic heart disease of native coronary artery without angina pectoris: Secondary | ICD-10-CM

## 2018-09-11 MED ORDER — EDARBI 40 MG PO TABS: 1.0000 | ORAL_TABLET | Freq: Every day | ORAL | 1 refills | Status: DC

## 2018-09-12 ENCOUNTER — Telehealth: Payer: Self-pay

## 2018-09-12 NOTE — Telephone Encounter (Signed)
Key: [id number]

## 2018-09-13 ENCOUNTER — Telehealth: Payer: Self-pay | Admitting: Internal Medicine

## 2018-09-13 NOTE — Telephone Encounter (Signed)
Left message with spouse to have patient call back.   RE: PA for [name] has been approved and she can call the pharmacy when she is ready to pick up.   Fax sent to pharmacy informing of same.

## 2018-09-13 NOTE — Telephone Encounter (Signed)
Patient called stating she would like to be put back on old BP medication and just have PCP up dosages if possible.  Patient states new BP med (Azilsartan Medoxomil) is too expensive.   Patient would like nurse to call back [phone]

## 2018-09-18 ENCOUNTER — Encounter: Payer: Self-pay | Admitting: Internal Medicine

## 2018-09-18 ENCOUNTER — Other Ambulatory Visit: Payer: Self-pay | Admitting: Internal Medicine

## 2018-09-18 DIAGNOSIS — I10 Essential (primary) hypertension: Secondary | ICD-10-CM

## 2018-09-18 DIAGNOSIS — I2510 Atherosclerotic heart disease of native coronary artery without angina pectoris: Secondary | ICD-10-CM

## 2018-09-18 MED ORDER — IRBESARTAN 150 MG PO TABS: 150.0000 mg | ORAL_TABLET | Freq: Every day | ORAL | 1 refills | Status: DC

## 2018-09-18 MED FILL — IRBESARTAN 150 MG TABS: 150 | 30 days supply | Qty: 30 | Fill #0

## 2018-10-17 DIAGNOSIS — H524 Presbyopia: Secondary | ICD-10-CM | POA: Diagnosis not present

## 2018-10-17 MED FILL — IRBESARTAN 150 MG TABS: 150 | 30 days supply | Qty: 30 | Fill #1

## 2018-10-23 ENCOUNTER — Telehealth: Payer: Self-pay

## 2018-10-23 NOTE — Telephone Encounter (Addendum)
Called pt to switch office visit with Dr. Harrington Challenger on 8/17 to virtual spoke with pt's husbands and he stated that his wife was at work and would call us back

## 2018-10-24 NOTE — Telephone Encounter (Signed)
Patient called back and accpected the virtual appt, I have changed it in Epic.

## 2018-10-24 NOTE — Telephone Encounter (Signed)
This encounter was created in error - please disregard.

## 2018-10-28 NOTE — Progress Notes (Signed)
Virtual Visit via Telephone Note   This visit type was conducted due to national recommendations for restrictions regarding the COVID-19 Pandemic (e.g. social distancing) in an effort to limit this patient's exposure and mitigate transmission in our community.  Due to her co-morbid illnesses, this patient is at least at moderate risk for complications without adequate follow up.  This format is felt to be most appropriate for this patient at this time.  The patient did not have access to video technology/had technical difficulties with video requiring transitioning to audio format only (telephone).  All issues noted in this document were discussed and addressed.  No physical exam could be performed with this format.  Please refer to the patient's chart for her  consent to telehealth for [facility].   Date:  10/30/2018   ID:  [name], [name] 05-02-1945, MRN [id number]  Patient Location: Home Provider Location: Office  PCP:  [name], MD  Cardiologist:  Dorris Carnes, MD  Electrophysiologist:  None   Evaluation Performed:  Follow-Up Visit  Chief Complaint:  Pt presents for f/u of CAD   History of Present Illness:    [name] is a 73 y.o. female  with a history ofCAD (s/p CABG in 2005 (LIMA to LAD; SVG to PDA; SVG to Diag)  ALos hx of HTN and tobacco abuse. She was last seen in cardiology clinic in Sept 2019    The patient says she feels good   She deneis CP   Breathing is OK   She is active  The patient does not have symptoms concerning for COVID-19 infection (fever, chills, cough, or new shortness of breath).    Past Medical History:  Diagnosis Date  . CAD (coronary artery disease)   . Cervical cancer ([place])    Dr Ulanda Edison  . Hyperlipidemia    Past Surgical History:  Procedure Laterality Date  . CORONARY ARTERY BYPASS GRAFT       Current Meds  Medication Sig  . amLODipine (NORVASC) 5 MG tablet Take 1 tablet (5 mg total) by mouth daily.  Marland Kitchen aspirin  EC 81 MG tablet Take 1 tablet (81 mg total) by mouth daily.  . Cholecalciferol 50 MCG (2000 UT) TABS Take 2 tablets (4,000 Units total) by mouth daily.  . irbesartan (AVAPRO) 150 MG tablet Take 1 tablet (150 mg total) by mouth daily.  . nitroGLYCERIN (NITROSTAT) 0.4 MG SL tablet Place 1 tablet (0.4 mg total) under the tongue every 5 (five) minutes as needed for chest pain (X3 DOSES BEFORE CALLING 911).  . rosuvastatin (CRESTOR) 20 MG tablet Take 1 tablet (20 mg total) by mouth daily.     Allergies:   Patient has no known allergies.   Social History   Tobacco Use  . Smoking status: Former Smoker    Quit date: 03/14/1985    Years since quitting: 33.6  . Smokeless tobacco: Never Used  Substance Use Topics  . Alcohol use: No  . Drug use: No     Family Hx: The patient's family history includes Coronary artery disease in an other family member; Diabetes in an other family member; Heart attack in her father; Hypertension in her mother and another family member. There is no history of Cancer, Stroke, or Hyperlipidemia.  ROS:   Please see the history of present illness.     All other systems reviewed and are negative.     Labs/Other Tests and Data Reviewed:  EKG:  No ECG reviewed.  Recent Labs: 09/04/2018: ALT 23; BUN 13; Creatinine, Ser 0.90; Hemoglobin 14.2; Platelets 170.0; Potassium 4.7; Sodium 135; TSH 3.29   Recent Lipid Panel Lab Results  Component Value Date/Time   CHOL 165 09/04/2018 08:29 AM   CHOL 154 02/08/2017 08:13 AM   TRIG 86.0 09/04/2018 08:29 AM   HDL 73.70 09/04/2018 08:29 AM   HDL 71 02/08/2017 08:13 AM   CHOLHDL 2 09/04/2018 08:29 AM   LDLCALC 74 09/04/2018 08:29 AM   LDLCALC 68 02/08/2017 08:13 AM    Wt Readings from Last 3 Encounters:  10/29/18 145 lb (65.8 kg)  09/04/18 147 lb 4 oz (66.8 kg)  07/19/18 143 lb (64.9 kg)     Objective:    Vital Signs:  BP (!) 141/56   Ht 5\' 4"  (1.626 m)   Wt 145 lb (65.8 kg)   BMI 24.89 kg/m    VITAL SIGNS:   reviewed  ASSESSMENT & PLAN:    1. CAD  Pt denies CP   Active   FOllow    2   HL   Currently on Crestor   Continue    3  HTN  BP is fair   Better than before   COntinue    COVID-19 Education: The signs and symptoms of COVID-19 were discussed with the patient and how to seek care for testing (follow up with PCP or arrange E-visit).  The importance of social distancing was discussed today.  Time:   Today, I have spent15 minutes with the patient with telehealth technology discussing the above problems.     Medication Adjustments/Labs and Tests Ordered: Current medicines are reviewed at length with the patient today.  Concerns regarding medicines are outlined above.   Tests Ordered: No orders of the defined types were placed in this encounter.   Medication Changes: No orders of the defined types were placed in this encounter.   Follow Up: F/U in the spring Signed, Dorris Carnes, MD  10/30/2018 12:00 AM    [place]

## 2018-10-29 ENCOUNTER — Telehealth (INDEPENDENT_AMBULATORY_CARE_PROVIDER_SITE_OTHER): Payer: 59 | Admitting: Internal Medicine

## 2018-10-29 ENCOUNTER — Encounter: Payer: Self-pay | Admitting: Internal Medicine

## 2018-10-29 ENCOUNTER — Other Ambulatory Visit: Payer: Self-pay

## 2018-10-29 DIAGNOSIS — E782 Mixed hyperlipidemia: Secondary | ICD-10-CM

## 2018-10-29 DIAGNOSIS — I10 Essential (primary) hypertension: Secondary | ICD-10-CM

## 2018-10-29 DIAGNOSIS — I2510 Atherosclerotic heart disease of native coronary artery without angina pectoris: Secondary | ICD-10-CM | POA: Diagnosis not present

## 2018-10-29 NOTE — Patient Instructions (Signed)
Medication Instructions:  No changes If you need a refill on your cardiac medications before your next appointment, please call your pharmacy.   Lab work: none If you have labs (blood work) drawn today and your tests are completely normal, you will receive your results only by: Marland Kitchen MyChart Message (if you have MyChart) OR . A paper copy in the mail If you have any lab test that is abnormal or we need to change your treatment, we will call you to review the results.  Testing/Procedures: none  Follow-Up: At [facility], you and your health needs are our priority.  As part of our continuing mission to provide you with exceptional heart care, we have created designated Provider Care Teams.  These Care Teams include your primary Cardiologist (physician) and Advanced Practice Providers (APPs -  Physician Assistants and Nurse Practitioners) who all work together to provide you with the care you need, when you need it. You will need a follow up appointment in:  8 months.  Please call our office 2 months in advance to schedule this appointment.  You may see Dorris Carnes, MD or one of the following Advanced Practice Providers on your designated Care Team: Richardson Dopp, PA-C [place], [place] . Daune Perch, NP  Any Other Special Instructions Will Be Listed Below (If Applicable).

## 2018-11-21 MED FILL — IRBESARTAN 150 MG TABLET: 150 | 30 days supply | Qty: 30 | Fill #2

## 2018-12-05 ENCOUNTER — Encounter: Payer: Self-pay | Admitting: Internal Medicine

## 2018-12-05 ENCOUNTER — Other Ambulatory Visit (INDEPENDENT_AMBULATORY_CARE_PROVIDER_SITE_OTHER): Payer: 59

## 2018-12-05 ENCOUNTER — Ambulatory Visit (INDEPENDENT_AMBULATORY_CARE_PROVIDER_SITE_OTHER): Payer: 59 | Admitting: Internal Medicine

## 2018-12-05 ENCOUNTER — Other Ambulatory Visit: Payer: Self-pay

## 2018-12-05 VITALS — BP 138/60 | HR 67 | Temp 98.5°F | Resp 16 | Ht 64.0 in | Wt 147.0 lb

## 2018-12-05 DIAGNOSIS — N183 Chronic kidney disease, stage 3 unspecified: Secondary | ICD-10-CM

## 2018-12-05 DIAGNOSIS — I10 Essential (primary) hypertension: Secondary | ICD-10-CM

## 2018-12-05 DIAGNOSIS — Z23 Encounter for immunization: Secondary | ICD-10-CM

## 2018-12-05 DIAGNOSIS — E559 Vitamin D deficiency, unspecified: Secondary | ICD-10-CM

## 2018-12-05 LAB — BASIC METABOLIC PANEL
BUN: 12 mg/dL (ref 6–23)
CO2: 27 mEq/L (ref 19–32)
Calcium: 9.7 mg/dL (ref 8.4–10.5)
Chloride: 100 mEq/L (ref 96–112)
Creatinine, Ser: 0.97 mg/dL (ref 0.40–1.20)
GFR: 56.25 mL/min — ABNORMAL LOW (ref >60.00)
Glucose, Bld: 91 mg/dL (ref 70–99)
Potassium: 4 mEq/L (ref 3.5–5.1)
Sodium: 137 mEq/L (ref 135–145)

## 2018-12-05 LAB — VITAMIN D 25 HYDROXY (VIT D DEFICIENCY, FRACTURES): VITD: 34.22 ng/mL (ref 30.00–100.00)

## 2018-12-05 NOTE — Patient Instructions (Signed)

## 2018-12-05 NOTE — Progress Notes (Signed)
Subjective:  Patient ID: [name], female    DOB: 17-Feb-1946  Age: 73 y.o. MRN: [id number]  CC: Hypertension   HPI ALANEY FIEGL presents for f/up - She tells me her blood pressure has been well controlled.   Outpatient Medications Prior to Visit  Medication Sig Dispense Refill   amLODipine (NORVASC) 5 MG tablet Take 1 tablet (5 mg total) by mouth daily. 90 tablet 3   aspirin EC 81 MG tablet Take 1 tablet (81 mg total) by mouth daily. 90 tablet 1   Cholecalciferol 50 MCG (2000 UT) TABS Take 2 tablets (4,000 Units total) by mouth daily. 180 tablet 1   irbesartan (AVAPRO) 150 MG tablet Take 1 tablet (150 mg total) by mouth daily. 90 tablet 1   rosuvastatin (CRESTOR) 20 MG tablet Take 1 tablet (20 mg total) by mouth daily. 90 tablet 3   nitroGLYCERIN (NITROSTAT) 0.4 MG SL tablet Place 1 tablet (0.4 mg total) under the tongue every 5 (five) minutes as needed for chest pain (X3 DOSES BEFORE CALLING 911). (Patient not taking: Reported on 12/05/2018) 25 tablet 6   No facility-administered medications prior to visit.     ROS Review of Systems  Constitutional: Negative for diaphoresis and fatigue.  HENT: Negative.   Eyes: Negative for visual disturbance.  Respiratory: Negative for cough, chest tightness, shortness of breath and wheezing.   Cardiovascular: Negative for palpitations and leg swelling.  Gastrointestinal: Negative for abdominal pain, constipation, diarrhea, nausea and vomiting.  Endocrine: Negative.   Genitourinary: Negative.  Negative for difficulty urinating.  Musculoskeletal: Negative for back pain, myalgias and neck pain.  Skin: Negative.  Negative for color change and pallor.  Neurological: Negative.  Negative for dizziness, weakness, light-headedness and headaches.  Hematological: Negative for adenopathy. Does not bruise/bleed easily.  Psychiatric/Behavioral: Negative.     Objective:  BP 138/60 (BP Location: Left Arm, Patient Position: Sitting, Cuff  Size: Normal) Comment: BP (R) 138/60 (L) 142/62   Pulse 67    Temp 98.5 F (36.9 C) (Oral)    Resp 16    Ht 5\' 4"  (1.626 m)    Wt 147 lb (66.7 kg)    SpO2 99%    BMI 25.23 kg/m   BP Readings from Last 3 Encounters:  12/05/18 138/60  10/29/18 (!) 141/56  09/04/18 (!) 160/64    Wt Readings from Last 3 Encounters:  12/05/18 147 lb (66.7 kg)  10/29/18 145 lb (65.8 kg)  09/04/18 147 lb 4 oz (66.8 kg)    Physical Exam Vitals signs reviewed.  Constitutional:      Appearance: Normal appearance. She is not ill-appearing or diaphoretic.  HENT:     Nose: Nose normal.     Mouth/Throat:     Mouth: Mucous membranes are moist.     Pharynx: No posterior oropharyngeal erythema.  Eyes:     General: No scleral icterus.    Conjunctiva/sclera: Conjunctivae normal.  Neck:     Musculoskeletal: Normal range of motion and neck supple.  Cardiovascular:     Rate and Rhythm: Normal rate and regular rhythm.     Heart sounds: No murmur.  Pulmonary:     Breath sounds: No stridor. No wheezing, rhonchi or rales.  Abdominal:     General: Abdomen is flat. Bowel sounds are normal.     Palpations: There is no hepatomegaly or splenomegaly.  Musculoskeletal: Normal range of motion.     Right lower leg: No edema.     Left lower leg: No  edema.  Lymphadenopathy:     Cervical: No cervical adenopathy.  Skin:    General: Skin is warm and dry.  Neurological:     General: No focal deficit present.     Mental Status: She is alert and oriented to person, place, and time. Mental status is at baseline.     Lab Results  Component Value Date   WBC 8.6 09/04/2018   HGB 14.2 09/04/2018   HCT 41.9 09/04/2018   PLT 170.0 09/04/2018   GLUCOSE 91 12/05/2018   CHOL 165 09/04/2018   TRIG 86.0 09/04/2018   HDL 73.70 09/04/2018   LDLCALC 74 09/04/2018   ALT 23 09/04/2018   AST 23 09/04/2018   NA 137 12/05/2018   K 4.0 12/05/2018   CL 100 12/05/2018   CREATININE 0.97 12/05/2018   BUN 12 12/05/2018   CO2 27  12/05/2018   TSH 3.29 09/04/2018    Dg Bone Density  Result Date: 05/09/2017 Date of study: 05/09/2017 Exam: DUAL X-RAY ABSORPTIOMETRY (DXA) FOR BONE MINERAL DENSITY (BMD) Instrument: [employer] Provider: PCP Indication: follow up for low BMD Comparison: none (please note that it is not possible to compare data from different instruments) Clinical data: Pt is a 73 y.o. female without previous history of fracture. On calcium and vitamin D. Results:  Lumbar spine L1-L4 Femoral neck (FN) T-score 0.0 RFN: -1.1 LFN: -1.2 Assessment: the BMD is low according to the [facility] classification for osteoporosis (see below). Fracture risk: moderate FRAX score: 10 year major osteoporotic risk: 9.5 %. 10 year hip fracture risk: 1.2 %. The thresholds for treatment are 20% and 3%, respectively. Comments: the technical quality of the study is good. Evaluation for secondary causes should be considered if clinically indicated. Recommend optimizing calcium (1200 mg/day) and vitamin D (800 IU/day) intake. Followup: Repeat BMD is appropriate after 2 years. WHO criteria for diagnosis of osteoporosis in postmenopausal women and in men 14 y/o or older: - normal: T-score -1.0 to + 1.0 - osteopenia/low bone density: T-score between -2.5 and -1.0 - osteoporosis: T-score below -2.5 - severe osteoporosis: T-score below -2.5 with history of fragility fracture Note: although not part of the WHO classification, the presence of a fragility fracture, regardless of the T-score, should be considered diagnostic of osteoporosis, provided other causes for the fracture have been excluded. Treatment: The National Osteoporosis Foundation recommends that treatment be considered in postmenopausal women and men age 90 or older with: 1. Hip or vertebral (clinical or morphometric) fracture 2. T-score of - 2.5 or lower at the spine or hip 3. 10-year fracture probability by FRAX of at least 20% for a major osteoporotic fracture and 3% for a hip  fracture Philemon Kingdom, MD [facility]    Assessment & Plan:   Samrah was seen today for hypertension.  Diagnoses and all orders for this visit:  Essential hypertension- Her BP is well controlled. Lytes and renal fxn are normal. -     Basic metabolic panel; Future  Vitamin D deficiency disease- Her VIt D level is normal now. -     VITAMIN D 25 Hydroxy (Vit-D Deficiency, Fractures); Future  Need for influenza vaccination -     Flu Vaccine QUAD High Dose(Fluad)  Chronic renal disease, stage 3, moderately decreased glomerular filtration rate (GFR) between 30-59 mL/min/1.73 square meter ([place])- She will avoid nephrotoxic agents.   I am having Arlesia C. Kennington maintain her rosuvastatin, amLODipine, aspirin EC, nitroGLYCERIN, Cholecalciferol, and irbesartan.  No orders of the defined types  were placed in this encounter.    Follow-up: Return in about 6 months (around 06/04/2019).  Scarlette Calico, MD

## 2018-12-12 ENCOUNTER — Other Ambulatory Visit: Payer: Self-pay | Admitting: Internal Medicine

## 2018-12-12 MED FILL — AMLODIPINE BESYLATE 5 MG TA: 5 | 90 days supply | Qty: 90 | Fill #0

## 2018-12-12 MED FILL — ASPIRIN 81 MG TBEC: 81 | 90 days supply | Qty: 90 | Fill #1

## 2018-12-23 MED FILL — IRBESARTAN 150 MG TABLET: 150 | 30 days supply | Qty: 30 | Fill #3

## 2019-01-16 DIAGNOSIS — Z124 Encounter for screening for malignant neoplasm of cervix: Secondary | ICD-10-CM | POA: Diagnosis not present

## 2019-01-16 DIAGNOSIS — E669 Obesity, unspecified: Secondary | ICD-10-CM | POA: Diagnosis not present

## 2019-01-16 DIAGNOSIS — Z130 Encounter for screening for diseases of the blood and blood-forming organs and certain disorders involving the immune mechanism: Secondary | ICD-10-CM | POA: Diagnosis not present

## 2019-01-16 DIAGNOSIS — Z1389 Encounter for screening for other disorder: Secondary | ICD-10-CM | POA: Diagnosis not present

## 2019-01-16 DIAGNOSIS — Z1231 Encounter for screening mammogram for malignant neoplasm of breast: Secondary | ICD-10-CM | POA: Diagnosis not present

## 2019-01-16 DIAGNOSIS — Z01419 Encounter for gynecological examination (general) (routine) without abnormal findings: Secondary | ICD-10-CM | POA: Diagnosis not present

## 2019-01-16 LAB — HM PAP SMEAR

## 2019-01-17 DIAGNOSIS — Z124 Encounter for screening for malignant neoplasm of cervix: Secondary | ICD-10-CM | POA: Diagnosis not present

## 2019-01-21 MED FILL — IRBESARTAN 150 MG TABLET: 150 | 30 days supply | Qty: 30 | Fill #4

## 2019-02-18 DIAGNOSIS — Z1231 Encounter for screening mammogram for malignant neoplasm of breast: Secondary | ICD-10-CM | POA: Diagnosis not present

## 2019-02-18 LAB — HM MAMMOGRAPHY

## 2019-02-19 MED FILL — IRBESARTAN 150 MG TABLET: 150 | 30 days supply | Qty: 30 | Fill #5

## 2019-03-12 MED FILL — AMLODIPINE BESYLATE 5 MG TA: 5 | 90 days supply | Qty: 90 | Fill #1

## 2019-03-13 ENCOUNTER — Other Ambulatory Visit: Payer: Self-pay | Admitting: Internal Medicine

## 2019-03-13 DIAGNOSIS — I2510 Atherosclerotic heart disease of native coronary artery without angina pectoris: Secondary | ICD-10-CM

## 2019-03-13 MED FILL — ASPIRIN 81 MG TBEC: 81 | 90 days supply | Qty: 90 | Fill #0

## 2019-03-13 MED FILL — ROSUVASTATIN CALCIUM 20 MG: 20 | 90 days supply | Qty: 90 | Fill #0

## 2019-03-22 ENCOUNTER — Other Ambulatory Visit: Payer: Self-pay | Admitting: Internal Medicine

## 2019-03-22 DIAGNOSIS — I2510 Atherosclerotic heart disease of native coronary artery without angina pectoris: Secondary | ICD-10-CM

## 2019-03-22 DIAGNOSIS — I10 Essential (primary) hypertension: Secondary | ICD-10-CM

## 2019-03-22 MED FILL — IRBESARTAN 150 MG TABLET: 150 | 90 days supply | Qty: 90 | Fill #0

## 2019-06-08 MED FILL — AMLODIPINE BESYLATE 5 MG TA: 5 | 90 days supply | Qty: 90 | Fill #2

## 2019-06-08 MED FILL — ROSUVASTATIN CALCIUM 20 MG: 20 | 90 days supply | Qty: 90 | Fill #1

## 2019-06-08 MED FILL — ASPIRIN 81 MG TBEC: 81 | 90 days supply | Qty: 90 | Fill #1

## 2019-06-20 MED FILL — IRBESARTAN 150 MG TAB: 150 | 90 days supply | Qty: 90 | Fill #1

## 2019-06-25 ENCOUNTER — Encounter: Payer: Self-pay | Admitting: Physician Assistant

## 2019-06-25 NOTE — Progress Notes (Signed)
Cardiology Office Note    Date:  06/27/2019   ID:  Karen, Hughes 17-Sep-1945, MRN [id number]  PCP:  Karen Lima, MD  Cardiologist:  Dr. Harrington Hughes  Chief Complaint: 18 Months follow up for CAD  History of Present Illness:   [name] is a 74 y.o. female history of CAD s/p CABG in 2005 (Hughes to LAD; SVG to PDA; SVG to Diag), hypertension, hyperlipidemia and prior tobacco abuse (25 pack year hx, quit in 1990s) presented for follow-up.  Normal Myoview in 2006.  Last seen by Dr. Harrington Hughes 11/2017.  Unable to reach during virtual visit last year.  Seen today for follow-up.  She walks 20 minutes each day without any problem.  Denies chest pain, shortness of breath, orthopnea, PND, syncope, lower extremity edema or melena.  Got Pfizer Covid vaccine.   Past Medical History:  Diagnosis Date  . CAD (coronary artery disease)   . Cervical cancer ([place])    Dr [name]  . Hyperlipidemia   . Hypertension   . Tobacco abuse     Past Surgical History:  Procedure Laterality Date  . CORONARY ARTERY BYPASS GRAFT      Current Medications: Prior to Admission medications   Medication Sig Start Date End Date Taking? Authorizing Provider  amLODipine (NORVASC) 5 MG tablet TAKE 1 TABLET (5 MG TOTAL) BY MOUTH DAILY. DOSE INCREASE 12/12/18   Karen Records, MD  aspirin 81 MG EC tablet TAKE 1 TABLET BY MOUTH ONCE A DAY 03/13/19   Karen Lima, MD  Cholecalciferol 50 MCG (2000 UT) TABS Take 2 tablets (4,000 Units total) by mouth daily. 09/04/18   Karen Lima, MD  irbesartan (AVAPRO) 150 MG tablet TAKE 1 TABLET BY MOUTH ONCE DAILY 03/22/19   Karen Lima, MD  nitroGLYCERIN (NITROSTAT) 0.4 MG SL tablet Place 1 tablet (0.4 mg total) under the tongue every 5 (five) minutes as needed for chest pain (X3 DOSES BEFORE CALLING 911). Patient not taking: Reported on 12/05/2018 09/04/18   Karen Lima, MD  rosuvastatin (CRESTOR) 20 MG tablet TAKE 1 TABLET (20 MG TOTAL) BY MOUTH DAILY. DISCONTINUE  ATORVASTATIN 03/13/19   Karen Records, MD  irbesartan (AVAPRO) 150 MG tablet Take 1 tablet (150 mg total) by mouth daily. 09/18/18   Karen Lima, MD    Allergies:   Patient has no known allergies.   Social History   Socioeconomic History  . Marital status: Married    Spouse name: Not on file  . Number of children: Not on file  . Years of education: Not on file  . Highest education level: Not on file  Occupational History  . Occupation: Marketing executive at [employer]  Tobacco Use  . Smoking status: Former Smoker    Quit date: 03/14/1985    Years since quitting: 34.3  . Smokeless tobacco: Never Used  Substance and Sexual Activity  . Alcohol use: No  . Drug use: No  . Sexual activity: Not Currently  Other Topics Concern  . Not on file  Social History Narrative   Regular Exercise -  YES   Social Determinants of Health   Financial Resource Strain:   . Difficulty of Paying Living Expenses:   Food Insecurity:   . Worried About Charity fundraiser in the Last Year:   . Arboriculturist in the Last Year:   Transportation Needs:   . Film/video editor (Medical):   Marland Kitchen  Lack of Transportation (Non-Medical):   Physical Activity:   . Days of Exercise per Week:   . Minutes of Exercise per Session:   Stress:   . Feeling of Stress :   Social Connections:   . Frequency of Communication with Friends and Family:   . Frequency of Social Gatherings with Friends and Family:   . Attends Religious Services:   . Active Member of Clubs or Organizations:   . Attends Archivist Meetings:   Marland Kitchen Marital Status:      Family History:  The patient's family history includes Coronary artery disease in an other family member; Diabetes in an other family member; Heart attack in her father; Hypertension in her mother and another family member.   ROS:   Please see the history of present illness.    ROS All other systems reviewed and are negative.   PHYSICAL EXAM:   VS:  BP  132/62   Pulse 64   Ht 5\' 4"  (1.626 m)   Wt 145 lb 12.8 oz (66.1 kg)   SpO2 98%   BMI 25.03 kg/m    GEN: Well nourished, well developed, in no acute distress  HEENT: normal  Neck: no JVD, carotid bruits, or masses Cardiac: RRR; no murmurs, rubs, or gallops,no edema  Respiratory:  clear to auscultation bilaterally, normal work of breathing GI: soft, nontender, nondistended, + BS MS: no deformity or atrophy  Skin: warm and dry, no rash Neuro:  Alert and Oriented x 3, Strength and sensation are intact Psych: euthymic mood, full affect  Wt Readings from Last 3 Encounters:  06/27/19 145 lb 12.8 oz (66.1 kg)  12/05/18 147 lb (66.7 kg)  10/29/18 145 lb (65.8 kg)      Studies/Labs Reviewed:   EKG:  EKG is ordered today.  The ekg ordered today demonstrates normal sinus rhythm rate of 64 bpm  Recent Labs: 09/04/2018: ALT 23; Hemoglobin 14.2; Platelets 170.0; TSH 3.29 12/05/2018: BUN 12; Creatinine, Ser 0.97; Potassium 4.0; Sodium 137   Lipid Panel    Component Value Date/Time   CHOL 165 09/04/2018 0829   CHOL 154 02/08/2017 0813   TRIG 86.0 09/04/2018 0829   HDL 73.70 09/04/2018 0829   HDL 71 02/08/2017 0813   CHOLHDL 2 09/04/2018 0829   VLDL 17.2 09/04/2018 0829   LDLCALC 74 09/04/2018 0829   LDLCALC 68 02/08/2017 0813    Additional studies/ Hughes that were reviewed today include:   As summarized above  ASSESSMENT & PLAN:    1. CAD No angina.  Continue aspirin and statin.  Continue walking regimen.  2. HTN Blood pressure stable and well-controlled on Norvasc and ibesartan.  3. HLD -Followed by PCP.  Continue Crestor 20 mg daily.  LDL 74 June 2020.     Medication Adjustments/Labs and Tests Ordered: Current medicines are reviewed at length with the patient today.  Concerns regarding medicines are outlined above.  Medication changes, Labs and Tests ordered today are listed in the Patient Instructions below. Patient Instructions  Medication Instructions:  Your  physician recommends that you continue on your current medications as directed. Please refer to the Current Medication list given to you today.  *If you need a refill on your cardiac medications before your next appointment, please call your pharmacy*   Lab Work: None ordered  If you have labs (blood work) drawn today and your tests are completely normal, you will receive your results only by: Marland Kitchen MyChart Message (if you have MyChart) OR . A  paper copy in the mail If you have any lab test that is abnormal or we need to change your treatment, we will call you to review the results.   Testing/Procedures: None ordered   Follow-Up: At [facility], you and your health needs are our priority.  As part of our continuing mission to provide you with exceptional heart care, we have created designated Provider Care Teams.  These Care Teams include your primary Cardiologist (physician) and Advanced Practice Providers (APPs -  Physician Assistants and Nurse Practitioners) who all work together to provide you with the care you need, when you need it.  We recommend signing up for the patient portal called "MyChart".  Sign up information is provided on this After Visit Summary.  MyChart is used to connect with patients for Virtual Visits (Telemedicine).  Patients are able to view lab/test results, encounter notes, upcoming appointments, etc.  Non-urgent messages can be sent to your provider as well.   To learn more about what you can do with MyChart, go to [email or web address].    Your next appointment:   12 month(s)  The format for your next appointment:   In Person  Provider:   You may see Dorris Carnes, MD  or one of the following Advanced Practice Providers on your designated Care Team:   Richardson Dopp, PA-C  [place], [place]  Daune Perch, NP    Other Instructions       Jarrett Soho, [place]  06/27/2019 9:11 AM    [address],  [employer], [place]  [address] Phone: [phone]; Fax: [phone]

## 2019-06-27 ENCOUNTER — Encounter: Payer: Self-pay | Admitting: Physician Assistant

## 2019-06-27 ENCOUNTER — Ambulatory Visit: Payer: 59 | Admitting: Physician Assistant

## 2019-06-27 ENCOUNTER — Other Ambulatory Visit: Payer: Self-pay

## 2019-06-27 VITALS — BP 132/62 | HR 64 | Ht 64.0 in | Wt 145.8 lb

## 2019-06-27 DIAGNOSIS — I2510 Atherosclerotic heart disease of native coronary artery without angina pectoris: Secondary | ICD-10-CM | POA: Diagnosis not present

## 2019-06-27 DIAGNOSIS — I10 Essential (primary) hypertension: Secondary | ICD-10-CM | POA: Diagnosis not present

## 2019-06-27 DIAGNOSIS — E785 Hyperlipidemia, unspecified: Secondary | ICD-10-CM

## 2019-06-27 NOTE — Patient Instructions (Signed)
Medication Instructions:  Your physician recommends that you continue on your current medications as directed. Please refer to the Current Medication list given to you today.  *If you need a refill on your cardiac medications before your next appointment, please call your pharmacy*   Lab Work: None ordered  If you have labs (blood work) drawn today and your tests are completely normal, you will receive your results only by: Marland Kitchen MyChart Message (if you have MyChart) OR . A paper copy in the mail If you have any lab test that is abnormal or we need to change your treatment, we will call you to review the results.   Testing/Procedures: None ordered   Follow-Up: At [facility], you and your health needs are our priority.  As part of our continuing mission to provide you with exceptional heart care, we have created designated Provider Care Teams.  These Care Teams include your primary Cardiologist (physician) and Advanced Practice Providers (APPs -  Physician Assistants and Nurse Practitioners) who all work together to provide you with the care you need, when you need it.  We recommend signing up for the patient portal called "MyChart".  Sign up information is provided on this After Visit Summary.  MyChart is used to connect with patients for Virtual Visits (Telemedicine).  Patients are able to view lab/test results, encounter notes, upcoming appointments, etc.  Non-urgent messages can be sent to your provider as well.   To learn more about what you can do with MyChart, go to [email or web address].    Your next appointment:   12 month(s)  The format for your next appointment:   In Person  Provider:   You may see Dorris Carnes, MD  or one of the following Advanced Practice Providers on your designated Care Team:   Richardson Dopp, PA-C  [place], [place]  Daune Perch, NP    Other Instructions

## 2019-09-05 ENCOUNTER — Other Ambulatory Visit: Payer: Self-pay | Admitting: Internal Medicine

## 2019-09-05 ENCOUNTER — Other Ambulatory Visit: Payer: Self-pay

## 2019-09-05 ENCOUNTER — Ambulatory Visit (INDEPENDENT_AMBULATORY_CARE_PROVIDER_SITE_OTHER): Payer: 59 | Admitting: Internal Medicine

## 2019-09-05 ENCOUNTER — Encounter: Payer: Self-pay | Admitting: Internal Medicine

## 2019-09-05 VITALS — BP 144/64 | HR 63 | Temp 98.2°F | Resp 16 | Ht 64.0 in | Wt 146.0 lb

## 2019-09-05 DIAGNOSIS — N1832 Chronic kidney disease, stage 3b: Secondary | ICD-10-CM

## 2019-09-05 DIAGNOSIS — E559 Vitamin D deficiency, unspecified: Secondary | ICD-10-CM

## 2019-09-05 DIAGNOSIS — E785 Hyperlipidemia, unspecified: Secondary | ICD-10-CM

## 2019-09-05 DIAGNOSIS — Z0000 Encounter for general adult medical examination without abnormal findings: Secondary | ICD-10-CM | POA: Diagnosis not present

## 2019-09-05 DIAGNOSIS — I2510 Atherosclerotic heart disease of native coronary artery without angina pectoris: Secondary | ICD-10-CM | POA: Diagnosis not present

## 2019-09-05 DIAGNOSIS — Z1231 Encounter for screening mammogram for malignant neoplasm of breast: Secondary | ICD-10-CM

## 2019-09-05 DIAGNOSIS — I10 Essential (primary) hypertension: Secondary | ICD-10-CM | POA: Diagnosis not present

## 2019-09-05 DIAGNOSIS — Z1211 Encounter for screening for malignant neoplasm of colon: Secondary | ICD-10-CM

## 2019-09-05 LAB — CBC WITH DIFFERENTIAL/PLATELET
Basophils Absolute: 0.1 10*3/uL (ref 0.0–0.1)
Basophils Relative: 1.3 % (ref 0.0–3.0)
Eosinophils Absolute: 0.3 10*3/uL (ref 0.0–0.7)
Eosinophils Relative: 4 % (ref 0.0–5.0)
HCT: 38 % (ref 36.0–46.0)
Hemoglobin: 13.2 g/dL (ref 12.0–15.0)
Lymphocytes Relative: 20.8 % (ref 12.0–46.0)
Lymphs Abs: 1.6 10*3/uL (ref 0.7–4.0)
MCHC: 34.7 g/dL (ref 30.0–36.0)
MCV: 92.6 fl (ref 78.0–100.0)
Monocytes Absolute: 0.4 10*3/uL (ref 0.1–1.0)
Monocytes Relative: 5 % (ref 3.0–12.0)
Neutro Abs: 5.2 10*3/uL (ref 1.4–7.7)
Neutrophils Relative %: 68.9 % (ref 43.0–77.0)
Platelets: 193 10*3/uL (ref 150.0–400.0)
RBC: 4.11 Mil/uL (ref 3.87–5.11)
RDW: 12.6 % (ref 11.5–15.5)
WBC: 7.6 10*3/uL (ref 4.0–10.5)

## 2019-09-05 LAB — URINALYSIS, ROUTINE W REFLEX MICROSCOPIC
Bilirubin Urine: NEGATIVE
Hgb urine dipstick: NEGATIVE
Ketones, ur: NEGATIVE
Leukocytes,Ua: NEGATIVE
Nitrite: NEGATIVE
RBC / HPF: NONE SEEN (ref 0–?)
Specific Gravity, Urine: 1.01 (ref 1.000–1.030)
Total Protein, Urine: NEGATIVE
Urine Glucose: NEGATIVE
Urobilinogen, UA: 0.2 (ref 0.0–1.0)
WBC, UA: NONE SEEN (ref 0–?)
pH: 6.5 (ref 5.0–8.0)

## 2019-09-05 LAB — HEPATIC FUNCTION PANEL
ALT: 15 U/L (ref 0–35)
AST: 21 U/L (ref 0–37)
Albumin: 4.7 g/dL (ref 3.5–5.2)
Alkaline Phosphatase: 81 U/L (ref 39–117)
Bilirubin, Direct: 0.2 mg/dL (ref 0.0–0.3)
Total Bilirubin: 0.9 mg/dL (ref 0.2–1.2)
Total Protein: 7.1 g/dL (ref 6.0–8.3)

## 2019-09-05 LAB — LIPID PANEL
Cholesterol: 172 mg/dL (ref 0–200)
HDL: 63.4 mg/dL (ref >39.00)
LDL Cholesterol: 87 mg/dL (ref 0–99)
NonHDL: 108.74
Total CHOL/HDL Ratio: 3
Triglycerides: 110 mg/dL (ref 0.0–149.0)
VLDL: 22 mg/dL (ref 0.0–40.0)

## 2019-09-05 LAB — TSH: TSH: 2.46 u[IU]/mL (ref 0.35–4.50)

## 2019-09-05 LAB — BASIC METABOLIC PANEL
BUN: 16 mg/dL (ref 6–23)
CO2: 28 mEq/L (ref 19–32)
Calcium: 10.1 mg/dL (ref 8.4–10.5)
Chloride: 101 mEq/L (ref 96–112)
Creatinine, Ser: 1.07 mg/dL (ref 0.40–1.20)
GFR: 50.13 mL/min — ABNORMAL LOW (ref >60.00)
Glucose, Bld: 96 mg/dL (ref 70–99)
Potassium: 4.5 mEq/L (ref 3.5–5.1)
Sodium: 134 mEq/L — ABNORMAL LOW (ref 135–145)

## 2019-09-05 LAB — VITAMIN D 25 HYDROXY (VIT D DEFICIENCY, FRACTURES): VITD: 41.55 ng/mL (ref 30.00–100.00)

## 2019-09-05 MED ORDER — ASPIRIN 81 MG PO TBEC: 81.0000 mg | DELAYED_RELEASE_TABLET | Freq: Every day | ORAL | 1 refills | Status: DC

## 2019-09-05 MED FILL — ASPIRIN 81 MG TBEC: 81 | 90 days supply | Qty: 90 | Fill #0

## 2019-09-05 NOTE — Patient Instructions (Signed)
Health Maintenance, Female Adopting a healthy lifestyle and getting preventive care are important in promoting health and wellness. Ask your health care provider about:  The right schedule for you to have regular tests and exams.  Things you can do on your own to prevent diseases and keep yourself healthy. What should I know about diet, weight, and exercise? Eat a healthy diet   Eat a diet that includes plenty of vegetables, fruits, low-fat dairy products, and lean protein.  Do not eat a lot of foods that are high in solid fats, added sugars, or sodium. Maintain a healthy weight Body mass index (BMI) is used to identify weight problems. It estimates body fat based on height and weight. Your health care provider can help determine your BMI and help you achieve or maintain a healthy weight. Get regular exercise Get regular exercise. This is one of the most important things you can do for your health. Most adults should:  Exercise for at least 150 minutes each week. The exercise should increase your heart rate and make you sweat (moderate-intensity exercise).  Do strengthening exercises at least twice a week. This is in addition to the moderate-intensity exercise.  Spend less time sitting. Even light physical activity can be beneficial. Watch cholesterol and blood lipids Have your blood tested for lipids and cholesterol at 74 years of age, then have this test every 5 years. Have your cholesterol levels checked more often if:  Your lipid or cholesterol levels are high.  You are older than 74 years of age.  You are at high risk for heart disease. What should I know about cancer screening? Depending on your health history and family history, you may need to have cancer screening at various ages. This may include screening for:  Breast cancer.  Cervical cancer.  Colorectal cancer.  Skin cancer.  Lung cancer. What should I know about heart disease, diabetes, and high blood  pressure? Blood pressure and heart disease  High blood pressure causes heart disease and increases the risk of stroke. This is more likely to develop in people who have high blood pressure readings, are of African descent, or are overweight.  Have your blood pressure checked: ? Every 3-5 years if you are 18-39 years of age. ? Every year if you are 40 years old or older. Diabetes Have regular diabetes screenings. This checks your fasting blood sugar level. Have the screening done:  Once every three years after age 40 if you are at a normal weight and have a low risk for diabetes.  More often and at a younger age if you are overweight or have a high risk for diabetes. What should I know about preventing infection? Hepatitis B If you have a higher risk for hepatitis B, you should be screened for this virus. Talk with your health care provider to find out if you are at risk for hepatitis B infection. Hepatitis C Testing is recommended for:  Everyone born from 1945 through 1965.  Anyone with known risk factors for hepatitis C. Sexually transmitted infections (STIs)  Get screened for STIs, including gonorrhea and chlamydia, if: ? You are sexually active and are younger than 74 years of age. ? You are older than 74 years of age and your health care provider tells you that you are at risk for this type of infection. ? Your sexual activity has changed since you were last screened, and you are at increased risk for chlamydia or gonorrhea. Ask your health care provider if   you are at risk.  Ask your health care provider about whether you are at high risk for HIV. Your health care provider may recommend a prescription medicine to help prevent HIV infection. If you choose to take medicine to prevent HIV, you should first get tested for HIV. You should then be tested every 3 months for as long as you are taking the medicine. Pregnancy  If you are about to stop having your period (premenopausal) and  you may become pregnant, seek counseling before you get pregnant.  Take 400 to 800 micrograms (mcg) of folic acid every day if you become pregnant.  Ask for birth control (contraception) if you want to prevent pregnancy. Osteoporosis and menopause Osteoporosis is a disease in which the bones lose minerals and strength with aging. This can result in bone fractures. If you are 65 years old or older, or if you are at risk for osteoporosis and fractures, ask your health care provider if you should:  Be screened for bone loss.  Take a calcium or vitamin D supplement to lower your risk of fractures.  Be given hormone replacement therapy (HRT) to treat symptoms of menopause. Follow these instructions at home: Lifestyle  Do not use any products that contain nicotine or tobacco, such as cigarettes, e-cigarettes, and chewing tobacco. If you need help quitting, ask your health care provider.  Do not use street drugs.  Do not share needles.  Ask your health care provider for help if you need support or information about quitting drugs. Alcohol use  Do not drink alcohol if: ? Your health care provider tells you not to drink. ? You are pregnant, may be pregnant, or are planning to become pregnant.  If you drink alcohol: ? Limit how much you use to 0-1 drink a day. ? Limit intake if you are breastfeeding.  Be aware of how much alcohol is in your drink. In the [place] one drink equals one 12 oz bottle of beer (355 mL), one 5 oz glass of wine (148 mL), or one 1 oz glass of hard liquor (44 mL). General instructions  Schedule regular health, dental, and eye exams.  Stay current with your vaccines.  Tell your health care provider if: ? You often feel depressed. ? You have ever been abused or do not feel safe at home. Summary  Adopting a healthy lifestyle and getting preventive care are important in promoting health and wellness.  Follow your health care provider's instructions about healthy  diet, exercising, and getting tested or screened for diseases.  Follow your health care provider's instructions on monitoring your cholesterol and blood pressure. This information is not intended to replace advice given to you by your health care provider. Make sure you discuss any questions you have with your health care provider. Document Revised: 02/21/2018 Document Reviewed: 02/21/2018 Elsevier Patient Education  2020 [employer]  

## 2019-09-05 NOTE — Progress Notes (Signed)
Subjective:  Patient ID: [name], female    DOB: March 04, 1946  Age: 74 y.o. MRN: [id number]  CC: Hypertension, Annual Exam, Hyperlipidemia, and Coronary Artery Disease  This visit occurred during the SARS-CoV-2 public health emergency.  Safety protocols were in place, including screening questions prior to the visit, additional usage of staff PPE, and extensive cleaning of exam room while observing appropriate contact time as indicated for disinfecting solutions.    HPI [name] presents for a CPX.  She tells me her blood pressure has been well controlled.  She is active and denies any recent episodes of chest pain, shortness of breath, diaphoresis, edema, fatigue, or palpitations. She has not recently used NTG. She complains of chronic arthritis pain in her feet for which she occasionally takes Motrin.  Outpatient Medications Prior to Visit  Medication Sig Dispense Refill  . amLODipine (NORVASC) 5 MG tablet TAKE 1 TABLET (5 MG TOTAL) BY MOUTH DAILY. DOSE INCREASE 90 tablet 3  . Cholecalciferol 50 MCG (2000 UT) TABS Take 2 tablets (4,000 Units total) by mouth daily. 180 tablet 1  . irbesartan (AVAPRO) 150 MG tablet TAKE 1 TABLET BY MOUTH ONCE DAILY 90 tablet 1  . nitroGLYCERIN (NITROSTAT) 0.4 MG SL tablet Place 1 tablet (0.4 mg total) under the tongue every 5 (five) minutes as needed for chest pain (X3 DOSES BEFORE CALLING 911). 25 tablet 6  . rosuvastatin (CRESTOR) 20 MG tablet TAKE 1 TABLET (20 MG TOTAL) BY MOUTH DAILY. DISCONTINUE ATORVASTATIN 90 tablet 3  . aspirin 81 MG EC tablet TAKE 1 TABLET BY MOUTH ONCE A DAY 90 tablet 1   No facility-administered medications prior to visit.    ROS Review of Systems  Constitutional: Negative.  Negative for appetite change, diaphoresis, fatigue and unexpected weight change.  HENT: Negative.   Eyes: Negative.   Respiratory: Negative for cough, chest tightness, shortness of breath and wheezing.   Cardiovascular: Negative for chest  pain, palpitations and leg swelling.  Gastrointestinal: Negative for abdominal pain, constipation, diarrhea, nausea and vomiting.  Endocrine: Negative.   Genitourinary: Negative.  Negative for difficulty urinating, dyspareunia, dysuria, hematuria and urgency.  Musculoskeletal: Positive for arthralgias. Negative for myalgias.  Skin: Negative.  Negative for color change, pallor and rash.  Neurological: Negative for dizziness, weakness, light-headedness and headaches.  Hematological: Negative for adenopathy. Does not bruise/bleed easily.  Psychiatric/Behavioral: Negative.     Objective:  BP (!) 144/64 (BP Location: Left Arm, Patient Position: Sitting, Cuff Size: Normal)   Pulse 63   Temp 98.2 F (36.8 C) (Oral)   Resp 16   Ht 5\' 4"  (1.626 m)   Wt 146 lb (66.2 kg)   SpO2 97%   BMI 25.06 kg/m   BP Readings from Last 3 Encounters:  09/05/19 (!) 144/64  06/27/19 132/62  12/05/18 138/60    Wt Readings from Last 3 Encounters:  09/05/19 146 lb (66.2 kg)  06/27/19 145 lb 12.8 oz (66.1 kg)  12/05/18 147 lb (66.7 kg)    Physical Exam Vitals reviewed.  Constitutional:      Appearance: Normal appearance.  HENT:     Nose: Nose normal.     Mouth/Throat:     Mouth: Mucous membranes are moist.  Eyes:     General: No scleral icterus.    Conjunctiva/sclera: Conjunctivae normal.  Cardiovascular:     Rate and Rhythm: Regular rhythm. Bradycardia present.     Heart sounds: No murmur heard.      Comments: EKG -  Sinus bradycardia, 59 bpm Minimal voltage criteria for LVH Otherwise normal Pulmonary:     Effort: Pulmonary effort is normal.     Breath sounds: No stridor. No wheezing, rhonchi or rales.  Musculoskeletal:        General: Normal range of motion.     Cervical back: Neck supple.     Right lower leg: No edema.     Left lower leg: No edema.  Lymphadenopathy:     Cervical: No cervical adenopathy.  Skin:    General: Skin is warm and dry.     Coloration: Skin is not pale.    Neurological:     General: No focal deficit present.     Mental Status: She is alert.  Psychiatric:        Mood and Affect: Mood normal.     Lab Results  Component Value Date   WBC 7.6 09/05/2019   HGB 13.2 09/05/2019   HCT 38.0 09/05/2019   PLT 193.0 09/05/2019   GLUCOSE 96 09/05/2019   CHOL 172 09/05/2019   TRIG 110.0 09/05/2019   HDL 63.40 09/05/2019   LDLCALC 87 09/05/2019   ALT 15 09/05/2019   AST 21 09/05/2019   NA 134 (L) 09/05/2019   K 4.5 09/05/2019   CL 101 09/05/2019   CREATININE 1.07 09/05/2019   BUN 16 09/05/2019   CO2 28 09/05/2019   TSH 2.46 09/05/2019    DG Bone Density  Result Date: 05/09/2017 Date of study: 05/09/2017 Exam: DUAL X-RAY ABSORPTIOMETRY (DXA) FOR BONE MINERAL DENSITY (BMD) Instrument: [employer] Provider: PCP Indication: follow up for low BMD Comparison: none (please note that it is not possible to compare data from different instruments) Clinical data: Pt is a 74 y.o. female without previous history of fracture. On calcium and vitamin D. Results:  Lumbar spine L1-L4 Femoral neck (FN) T-score 0.0 RFN: -1.1 LFN: -1.2 Assessment: the BMD is low according to the [facility] classification for osteoporosis (see below). Fracture risk: moderate FRAX score: 10 year major osteoporotic risk: 9.5 %. 10 year hip fracture risk: 1.2 %. The thresholds for treatment are 20% and 3%, respectively. Comments: the technical quality of the study is good. Evaluation for secondary causes should be considered if clinically indicated. Recommend optimizing calcium (1200 mg/day) and vitamin D (800 IU/day) intake. Followup: Repeat BMD is appropriate after 2 years. WHO criteria for diagnosis of osteoporosis in postmenopausal women and in men 30 y/o or older: - normal: T-score -1.0 to + 1.0 - osteopenia/low bone density: T-score between -2.5 and -1.0 - osteoporosis: T-score below -2.5 - severe osteoporosis: T-score below -2.5 with history of fragility fracture Note:  although not part of the WHO classification, the presence of a fragility fracture, regardless of the T-score, should be considered diagnostic of osteoporosis, provided other causes for the fracture have been excluded. Treatment: The National Osteoporosis Foundation recommends that treatment be considered in postmenopausal women and men age 15 or older with: 1. Hip or vertebral (clinical or morphometric) fracture 2. T-score of - 2.5 or lower at the spine or hip 3. 10-year fracture probability by FRAX of at least 20% for a major osteoporotic fracture and 3% for a hip fracture Philemon Kingdom, MD [facility]    Assessment & Plan:   Jericca was seen today for hypertension, annual exam, hyperlipidemia and coronary artery disease.  Diagnoses and all orders for this visit:  Essential hypertension- Her blood pressure is adequately well controlled.  Electrolytes are normal and renal function is  stable.  Will continue the combination of a calcium channel blocker and ARB. -     CBC with Differential/Platelet; Future -     Basic metabolic panel; Future -     TSH; Future -     Urinalysis, Routine w reflex microscopic; Future -     EKG 12-Lead -     TSH -     Basic metabolic panel -     CBC with Differential/Platelet -     Urinalysis, Routine w reflex microscopic  Atherosclerosis of native coronary artery of native heart without angina pectoris- She has had no recent episodes of angina.  Will continue to address risk factor modifications. -     aspirin 81 MG EC tablet; Take 1 tablet (81 mg total) by mouth daily. Swallow whole.  Stage 3b chronic kidney disease- Her renal function is stable.  I recommended that she avoid nephrotoxic agents like Motrin.  Will continue to maintain control of her blood pressure. -     Basic metabolic panel; Future -     Urinalysis, Routine w reflex microscopic; Future -     Basic metabolic panel -     Urinalysis, Routine w reflex microscopic  Hyperlipidemia LDL  goal <70- She has achieved her LDL goal and is doing well on the statin. -     TSH; Future -     Hepatic function panel; Future -     Hepatic function panel -     TSH  Vitamin D deficiency disease- Her vitamin D level is normal now. -     VITAMIN D 25 Hydroxy (Vit-D Deficiency, Fractures); Future -     VITAMIN D 25 Hydroxy (Vit-D Deficiency, Fractures)  Visit for screening mammogram  Routine general medical examination at a health care facility- Exam completed, labs reviewed, vaccines reviewed and updated, screening for breast and cervical cancer is up-to-date, Cologuard ordered to screen for colon cancer, patient education was given. -     Lipid panel; Future -     Lipid panel  Colon cancer screening -     Cologuard   I have changed Amarii C. Wynder's aspirin. I am also having her maintain her nitroGLYCERIN, Cholecalciferol, amLODipine, rosuvastatin, and irbesartan.  Meds ordered this encounter  Medications  . aspirin 81 MG EC tablet    Sig: Take 1 tablet (81 mg total) by mouth daily. Swallow whole.    Dispense:  90 tablet    Refill:  1     Follow-up: Return in about 6 months (around 03/06/2020).  Scarlette Calico, MD

## 2019-09-13 ENCOUNTER — Other Ambulatory Visit: Payer: Self-pay | Admitting: Internal Medicine

## 2019-09-13 DIAGNOSIS — I2510 Atherosclerotic heart disease of native coronary artery without angina pectoris: Secondary | ICD-10-CM

## 2019-09-13 MED FILL — NITROGLYCERIN 0.4 MG TAB SL: 0.4 | 8 days supply | Qty: 25 | Fill #0

## 2019-09-13 MED FILL — AMLODIPINE BESYLATE 5 MG TA: 5 | 90 days supply | Qty: 90 | Fill #3

## 2019-09-19 ENCOUNTER — Encounter: Payer: Self-pay | Admitting: Internal Medicine

## 2019-10-02 ENCOUNTER — Other Ambulatory Visit: Payer: Self-pay | Admitting: Internal Medicine

## 2019-10-02 DIAGNOSIS — I2510 Atherosclerotic heart disease of native coronary artery without angina pectoris: Secondary | ICD-10-CM

## 2019-10-02 DIAGNOSIS — I10 Essential (primary) hypertension: Secondary | ICD-10-CM

## 2019-10-02 MED FILL — IRBESARTAN 150 MG TAB: 150 | 90 days supply | Qty: 90 | Fill #0

## 2019-10-02 MED FILL — ASPIRIN 81 MG TBEC: 81 | 90 days supply | Qty: 90 | Fill #0

## 2019-10-31 MED FILL — AMOXICILLIN 500 MG CAPSULE: 500 | 10 days supply | Qty: 30 | Fill #0

## 2019-12-19 ENCOUNTER — Other Ambulatory Visit: Payer: Self-pay

## 2019-12-19 ENCOUNTER — Other Ambulatory Visit: Payer: Self-pay | Admitting: Internal Medicine

## 2019-12-19 MED ORDER — AMLODIPINE BESYLATE 5 MG PO TABS: 5.0000 mg | ORAL_TABLET | Freq: Every day | ORAL | 1 refills | Status: DC

## 2019-12-19 MED FILL — IRBESARTAN 150 MG TAB: 150 | 90 days supply | Qty: 90 | Fill #1

## 2019-12-19 MED FILL — AMLODIPINE BESYLATE 5 MG TA: 5 | 90 days supply | Qty: 90 | Fill #0

## 2020-01-14 ENCOUNTER — Telehealth: Payer: Self-pay

## 2020-01-14 NOTE — Telephone Encounter (Signed)
Pt confirms that she received Cologuard kit in the mail, but has not completed it.  Denies questions at this time & states she will complete it when able (she has Covid right now).

## 2020-01-25 MED FILL — ASPIRIN LOW DOSE 81 MG TBEC: 81 | 90 days supply | Qty: 90 | Fill #1

## 2020-02-24 DIAGNOSIS — Z1231 Encounter for screening mammogram for malignant neoplasm of breast: Secondary | ICD-10-CM | POA: Diagnosis not present

## 2020-02-24 DIAGNOSIS — Z130 Encounter for screening for diseases of the blood and blood-forming organs and certain disorders involving the immune mechanism: Secondary | ICD-10-CM | POA: Diagnosis not present

## 2020-02-24 DIAGNOSIS — Z01419 Encounter for gynecological examination (general) (routine) without abnormal findings: Secondary | ICD-10-CM | POA: Diagnosis not present

## 2020-02-24 DIAGNOSIS — Z1389 Encounter for screening for other disorder: Secondary | ICD-10-CM | POA: Diagnosis not present

## 2020-02-24 DIAGNOSIS — E669 Obesity, unspecified: Secondary | ICD-10-CM | POA: Diagnosis not present

## 2020-02-25 DIAGNOSIS — Z124 Encounter for screening for malignant neoplasm of cervix: Secondary | ICD-10-CM | POA: Diagnosis not present

## 2020-03-17 ENCOUNTER — Other Ambulatory Visit: Payer: Self-pay | Admitting: Internal Medicine

## 2020-03-17 MED FILL — ROSUVASTATIN CALCIUM 20 MG: 20 | 90 days supply | Qty: 90 | Fill #0

## 2020-03-24 ENCOUNTER — Other Ambulatory Visit: Payer: Self-pay | Admitting: Internal Medicine

## 2020-03-24 ENCOUNTER — Other Ambulatory Visit: Payer: Self-pay | Admitting: *Deleted

## 2020-03-24 MED ORDER — AMLODIPINE BESYLATE 5 MG PO TABS: 5.0000 mg | ORAL_TABLET | Freq: Every day | ORAL | 2 refills | Status: DC

## 2020-03-24 MED FILL — AMLODIPINE BESYLATE 5 MG TA: 5 | 90 days supply | Qty: 90 | Fill #0

## 2020-03-27 ENCOUNTER — Other Ambulatory Visit: Payer: Self-pay | Admitting: Internal Medicine

## 2020-03-27 DIAGNOSIS — E785 Hyperlipidemia, unspecified: Secondary | ICD-10-CM

## 2020-03-27 DIAGNOSIS — I2510 Atherosclerotic heart disease of native coronary artery without angina pectoris: Secondary | ICD-10-CM

## 2020-03-27 MED ORDER — ROSUVASTATIN CALCIUM 20 MG PO TABS: 20.0000 mg | ORAL_TABLET | Freq: Every day | ORAL | 0 refills | Status: DC

## 2020-04-04 ENCOUNTER — Other Ambulatory Visit: Payer: Self-pay | Admitting: Internal Medicine

## 2020-04-04 DIAGNOSIS — I10 Essential (primary) hypertension: Secondary | ICD-10-CM

## 2020-04-04 DIAGNOSIS — I2510 Atherosclerotic heart disease of native coronary artery without angina pectoris: Secondary | ICD-10-CM

## 2020-04-04 MED FILL — AMLODIPINE BESYLATE 5 MG TA: 5 | 90 days supply | Qty: 90 | Fill #0

## 2020-04-07 ENCOUNTER — Encounter: Payer: Self-pay | Admitting: Internal Medicine

## 2020-04-07 ENCOUNTER — Ambulatory Visit: Payer: 59 | Admitting: Internal Medicine

## 2020-04-07 ENCOUNTER — Other Ambulatory Visit: Payer: Self-pay | Admitting: Internal Medicine

## 2020-04-07 ENCOUNTER — Other Ambulatory Visit: Payer: Self-pay

## 2020-04-07 DIAGNOSIS — I10 Essential (primary) hypertension: Secondary | ICD-10-CM

## 2020-04-07 DIAGNOSIS — N1832 Chronic kidney disease, stage 3b: Secondary | ICD-10-CM

## 2020-04-07 DIAGNOSIS — I2510 Atherosclerotic heart disease of native coronary artery without angina pectoris: Secondary | ICD-10-CM | POA: Diagnosis not present

## 2020-04-07 LAB — CBC WITH DIFFERENTIAL/PLATELET
Basophils Absolute: 0.1 10*3/uL (ref 0.0–0.1)
Basophils Relative: 1 % (ref 0.0–3.0)
Eosinophils Absolute: 0.5 10*3/uL (ref 0.0–0.7)
Eosinophils Relative: 6.4 % — ABNORMAL HIGH (ref 0.0–5.0)
HCT: 37.3 % (ref 36.0–46.0)
Hemoglobin: 12.9 g/dL (ref 12.0–15.0)
Lymphocytes Relative: 21.2 % (ref 12.0–46.0)
Lymphs Abs: 1.8 10*3/uL (ref 0.7–4.0)
MCHC: 34.5 g/dL (ref 30.0–36.0)
MCV: 90.4 fl (ref 78.0–100.0)
Monocytes Absolute: 0.6 10*3/uL (ref 0.1–1.0)
Monocytes Relative: 6.7 % (ref 3.0–12.0)
Neutro Abs: 5.4 10*3/uL (ref 1.4–7.7)
Neutrophils Relative %: 64.7 % (ref 43.0–77.0)
Platelets: 184 10*3/uL (ref 150.0–400.0)
RBC: 4.12 Mil/uL (ref 3.87–5.11)
RDW: 14 % (ref 11.5–15.5)
WBC: 8.3 10*3/uL (ref 4.0–10.5)

## 2020-04-07 LAB — BASIC METABOLIC PANEL
BUN: 14 mg/dL (ref 6–23)
CO2: 29 mEq/L (ref 19–32)
Calcium: 9.5 mg/dL (ref 8.4–10.5)
Chloride: 104 mEq/L (ref 96–112)
Creatinine, Ser: 0.99 mg/dL (ref 0.40–1.20)
GFR: 56.14 mL/min — ABNORMAL LOW (ref >60.00)
Glucose, Bld: 66 mg/dL — ABNORMAL LOW (ref 70–99)
Potassium: 3.8 mEq/L (ref 3.5–5.1)
Sodium: 139 mEq/L (ref 135–145)

## 2020-04-07 MED ORDER — IRBESARTAN 150 MG PO TABS: 150.0000 mg | ORAL_TABLET | Freq: Every day | ORAL | 1 refills | Status: DC

## 2020-04-07 MED FILL — IRBESARTAN 150 MG TAB: 150 | 90 days supply | Qty: 90 | Fill #0

## 2020-04-07 NOTE — Patient Instructions (Signed)

## 2020-04-07 NOTE — Progress Notes (Signed)
Subjective:  Patient ID: [name], female    DOB: 16-Nov-1945  Age: 75 y.o. MRN: [id number]  CC: Hypertension  This visit occurred during the SARS-CoV-2 public health emergency.  Safety protocols were in place, including screening questions prior to the visit, additional usage of staff PPE, and extensive cleaning of exam room while observing appropriate contact time as indicated for disinfecting solutions.    HPI [name] presents for f/up - She tells me that her BP is well controlled. She is active and denies CP/DOE/palpitations/edema/fatigue.  Outpatient Medications Prior to Visit  Medication Sig Dispense Refill  . amLODipine (NORVASC) 5 MG tablet Take 1 tablet (5 mg total) by mouth daily. 90 tablet 2  . aspirin 81 MG EC tablet Take 1 tablet (81 mg total) by mouth daily. Swallow whole. 90 tablet 1  . Cholecalciferol 50 MCG (2000 UT) TABS Take 2 tablets (4,000 Units total) by mouth daily. 180 tablet 1  . nitroGLYCERIN (NITROSTAT) 0.4 MG SL tablet PLACE 1 TABLET (0.4 MG TOTAL) UNDER THE TONGUE EVERY 5 (FIVE) MINUTES AS NEEDED FOR CHEST PAIN (X3 DOSES BEFORE CALLING 911). 25 tablet 6  . rosuvastatin (CRESTOR) 20 MG tablet Take 1 tablet (20 mg total) by mouth daily. 90 tablet 0  . irbesartan (AVAPRO) 150 MG tablet TAKE 1 TABLET BY MOUTH ONCE DAILY 90 tablet 1   No facility-administered medications prior to visit.    ROS Review of Systems  Constitutional: Negative for diaphoresis and fatigue.  HENT: Negative.   Eyes: Negative.   Respiratory: Negative for cough, chest tightness, shortness of breath and wheezing.   Cardiovascular: Negative for chest pain, palpitations and leg swelling.  Gastrointestinal: Negative for abdominal pain, diarrhea, nausea and vomiting.  Endocrine: Negative.  Negative for polyuria.  Genitourinary: Negative.  Negative for difficulty urinating and frequency.  Musculoskeletal: Negative for arthralgias and myalgias.  Skin: Negative.   Neurological:  Negative.  Negative for dizziness, weakness and light-headedness.  Hematological: Negative for adenopathy. Does not bruise/bleed easily.  Psychiatric/Behavioral: Negative.     Objective:  BP 126/74   Pulse 67   Temp 98.3 F (36.8 C) (Oral)   Ht 5\' 4"  (1.626 m)   Wt 141 lb (64 kg)   SpO2 98%   BMI 24.20 kg/m   BP Readings from Last 3 Encounters:  04/07/20 126/74  09/05/19 (!) 144/64  06/27/19 132/62    Wt Readings from Last 3 Encounters:  04/07/20 141 lb (64 kg)  09/05/19 146 lb (66.2 kg)  06/27/19 145 lb 12.8 oz (66.1 kg)    Physical Exam Vitals reviewed.  Constitutional:      Appearance: Normal appearance.  HENT:     Nose: Nose normal.     Mouth/Throat:     Mouth: Mucous membranes are moist.  Eyes:     General: No scleral icterus.    Conjunctiva/sclera: Conjunctivae normal.  Cardiovascular:     Rate and Rhythm: Normal rate and regular rhythm.     Heart sounds: No murmur heard.   Pulmonary:     Effort: Pulmonary effort is normal.     Breath sounds: No stridor. No wheezing, rhonchi or rales.  Abdominal:     General: Abdomen is flat.     Palpations: There is no mass.     Tenderness: There is no abdominal tenderness. There is no guarding.  Musculoskeletal:        General: Normal range of motion.     Cervical back: Neck supple.  Right lower leg: No edema.  Lymphadenopathy:     Cervical: No cervical adenopathy.  Skin:    General: Skin is warm and dry.     Coloration: Skin is not pale.  Neurological:     General: No focal deficit present.     Mental Status: She is alert and oriented to person, place, and time. Mental status is at baseline.  Psychiatric:        Mood and Affect: Mood normal.        Behavior: Behavior normal.     Lab Results  Component Value Date   WBC 8.3 04/07/2020   HGB 12.9 04/07/2020   HCT 37.3 04/07/2020   PLT 184.0 04/07/2020   GLUCOSE 66 (L) 04/07/2020   CHOL 172 09/05/2019   TRIG 110.0 09/05/2019   HDL 63.40 09/05/2019    LDLCALC 87 09/05/2019   ALT 15 09/05/2019   AST 21 09/05/2019   NA 139 04/07/2020   K 3.8 04/07/2020   CL 104 04/07/2020   CREATININE 0.99 04/07/2020   BUN 14 04/07/2020   CO2 29 04/07/2020   TSH 2.46 09/05/2019    DG Bone Density  Result Date: 05/09/2017 Date of Hughes: 05/09/2017 Exam: DUAL X-RAY ABSORPTIOMETRY (DXA) FOR BONE MINERAL DENSITY (BMD) Instrument: [employer] Provider: PCP Indication: follow up for low BMD Comparison: none (please note that it is not possible to compare data from different instruments) Clinical data: Pt is a 75 y.o. female without previous history of fracture. On calcium and vitamin D. Results:  Lumbar spine L1-L4 Femoral neck (FN) T-score 0.0 RFN: -1.1 LFN: -1.2 Assessment: the BMD is low according to the [facility] classification for osteoporosis (see below). Fracture risk: moderate FRAX score: 10 year major osteoporotic risk: 9.5 %. 10 year hip fracture risk: 1.2 %. The thresholds for treatment are 20% and 3%, respectively. Comments: the technical quality of the Hughes is good. Evaluation for secondary causes should be considered if clinically indicated. Recommend optimizing calcium (1200 mg/day) and vitamin D (800 IU/day) intake. Followup: Repeat BMD is appropriate after 2 years. WHO criteria for diagnosis of osteoporosis in postmenopausal women and in men 72 y/o or older: - normal: T-score -1.0 to + 1.0 - osteopenia/low bone density: T-score between -2.5 and -1.0 - osteoporosis: T-score below -2.5 - severe osteoporosis: T-score below -2.5 with history of fragility fracture Note: although not part of the WHO classification, the presence of a fragility fracture, regardless of the T-score, should be considered diagnostic of osteoporosis, provided other causes for the fracture have been excluded. Treatment: The National Osteoporosis Foundation recommends that treatment be considered in postmenopausal women and men age 82 or older with: 1. Hip or vertebral  (clinical or morphometric) fracture 2. T-score of - 2.5 or lower at the spine or hip 3. 10-year fracture probability by FRAX of at least 20% for a major osteoporotic fracture and 3% for a hip fracture Philemon Kingdom, MD [facility]    Assessment & Plan:   [name] was seen today for hypertension.  Diagnoses and all orders for this visit:  Atherosclerosis of native coronary artery of native heart without angina pectoris- She has had no recent episodes of angina. -     irbesartan (AVAPRO) 150 MG tablet; Take 1 tablet (150 mg total) by mouth daily.  Essential hypertension- Her BP is well controlled. Lytes and renal function are normal. -     irbesartan (AVAPRO) 150 MG tablet; Take 1 tablet (150 mg total) by mouth daily. -  CBC with Differential/Platelet; Future -     Basic metabolic panel; Future -     Basic metabolic panel -     CBC with Differential/Platelet   I have changed [name]'s irbesartan. I am also having her maintain her Cholecalciferol, aspirin, nitroGLYCERIN, amLODipine, and rosuvastatin.  Meds ordered this encounter  Medications  . irbesartan (AVAPRO) 150 MG tablet    Sig: Take 1 tablet (150 mg total) by mouth daily.    Dispense:  90 tablet    Refill:  1     Follow-up: Return in about 6 months (around 10/05/2020).  Scarlette Calico, MD

## 2020-04-07 NOTE — Assessment & Plan Note (Signed)
Her renal function has declined slightly Will continue to maintain control of her BP.

## 2020-04-27 NOTE — Telephone Encounter (Signed)
Left message with husband to complete cologuard kit.  Husband stated that they both still have their cologuard kits.

## 2020-05-06 ENCOUNTER — Other Ambulatory Visit: Payer: Self-pay | Admitting: Internal Medicine

## 2020-05-06 DIAGNOSIS — I2510 Atherosclerotic heart disease of native coronary artery without angina pectoris: Secondary | ICD-10-CM

## 2020-05-06 MED FILL — ASPIRIN 81 MG TBEC: 81 | 90 days supply | Qty: 90 | Fill #0

## 2020-07-16 ENCOUNTER — Other Ambulatory Visit (HOSPITAL_COMMUNITY): Payer: Self-pay

## 2020-07-16 MED FILL — Irbesartan Tab 150 MG: ORAL | 90 days supply | Qty: 90 | Fill #0 | Status: AC

## 2020-07-16 MED FILL — Amlodipine Besylate Tab 5 MG (Base Equivalent): ORAL | 90 days supply | Qty: 90 | Fill #0 | Status: AC

## 2020-07-19 NOTE — Progress Notes (Signed)
Cardiology Office Note   Date:  07/21/2020   ID:  Karen, Hughes 07/13/1945, MRN [id number]  PCP:  [name], MD  Cardiologist:   Dorris Carnes, MD   F/U of CAD     History of Present Illness: [name] is a 75 y.o. female with a history ofCAD s/p CABG in 2005 (LIMA to LAD; SVG to PDA; SVG to Diag; normal myovuiew in 2006 ), hypertension, hyperlipidemia and prior tobacco abuse (25 pack year hx, quit in 1990s) presented for follow-up. The pt was last in clinic in APril 2021   Seen by B Bhagat   Since seen pt says she isnt walking as much as she used to  Did have one episode of  L shoulder blade pain    L sided   ON and offf for a couple weeks  Somewhat positional   Gone now.     Current Meds  Medication Sig  . amLODipine (NORVASC) 5 MG tablet TAKE 1 TABLET BY MOUTH DAILY.  Marland Kitchen aspirin 81 MG EC tablet TAKE 1 TABLET BY MOUTH ONCE DAILY (SWALLOW WHOLE)  . Cholecalciferol 50 MCG (2000 UT) TABS Take 2 tablets (4,000 Units total) by mouth daily.  . irbesartan (AVAPRO) 150 MG tablet TAKE 1 TABLET BY MOUTH DAILY.  . nitroGLYCERIN (NITROSTAT) 0.4 MG SL tablet PLACE 1 TABLET (0.4 MG TOTAL) UNDER THE TONGUE EVERY 5 (FIVE) MINUTES AS NEEDED FOR CHEST PAIN (X3 DOSES BEFORE CALLING 911).  . rosuvastatin (CRESTOR) 20 MG tablet TAKE 1 TABLET BY MOUTH ONCE A DAY     Allergies:   Patient has no known allergies.   Past Medical History:  Diagnosis Date  . CAD (coronary artery disease)   . Cervical cancer ([place])    Dr Ulanda Edison  . Hyperlipidemia   . Hypertension   . Tobacco abuse     Past Surgical History:  Procedure Laterality Date  . CORONARY ARTERY BYPASS GRAFT       Social History:  The patient  reports that she quit smoking about 35 years ago. She has never used smokeless tobacco. She reports that she does not drink alcohol and does not use drugs.   Family History:  The patient's family history includes Coronary artery disease in an other family member; Diabetes in an  other family member; Heart attack in her father; Hypertension in her mother and another family member.    ROS:  Please see the history of present illness. All other systems are reviewed and  Negative to the above problem except as noted.    PHYSICAL EXAM: VS:  BP 126/68   Pulse 62   Ht 5\' 4"  (1.626 m)   Wt 140 lb (63.5 kg)   SpO2 99%   BMI 24.03 kg/m   GEN: Well nourished, well developed, in no acute distress  HEENT: normal  Neck: no JVD, carotid bruits Cardiac: RRR; no murmurs.  ,no LE edema  Respiratory:  clear to auscultation bilaterally GI: soft, nontender, nondistended, + BS  No hepatomegaly  MS: no deformity Moving all extremities   Skin: warm and dry, no rash Neuro:  Strength and sensation are intact Psych: euthymic mood, full affect   EKG:  EKG is not ordered today.   Lipid Panel    Component Value Date/Time   CHOL 172 09/05/2019 1018   CHOL 154 02/08/2017 0813   TRIG 110.0 09/05/2019 1018   HDL 63.40 09/05/2019 1018   HDL 71 02/08/2017 0813   CHOLHDL 3  09/05/2019 1018   VLDL 22.0 09/05/2019 1018   LDLCALC 87 09/05/2019 1018   LDLCALC 68 02/08/2017 0813      Wt Readings from Last 3 Encounters:  07/21/20 140 lb (63.5 kg)  04/07/20 141 lb (64 kg)  09/05/19 146 lb (66.2 kg)      ASSESSMENT AND PLAN:  1  CAD  Pt with remote CABG    2005 .    Spell of back pain a few wks ago   I am not convinced that it was an anginal equivalent   Gone now    Given that CABG was 17 years ago would sched stress myovue to r/o ischemia  2  HTN   BP is controlled   Keep on same meds  3  HL   Will check lipdsi    LDL last years was a little high   Cont statin   Will call re dose adjustments   Otherwise plan to f/u in 1 year   SOoner for problems     Current medicines are reviewed at length with the patient today.  The patient does not have concerns regarding medicines.  Signed, Dorris Carnes, MD  07/21/2020 8:21 AM    [facility] [address],  [place], [place]  [address] Phone: [phone]; Fax: [phone]

## 2020-07-20 ENCOUNTER — Other Ambulatory Visit (HOSPITAL_COMMUNITY): Payer: Self-pay

## 2020-07-21 ENCOUNTER — Encounter: Payer: Self-pay | Admitting: Internal Medicine

## 2020-07-21 ENCOUNTER — Other Ambulatory Visit: Payer: Self-pay

## 2020-07-21 ENCOUNTER — Ambulatory Visit: Payer: 59 | Admitting: Internal Medicine

## 2020-07-21 ENCOUNTER — Encounter: Payer: Self-pay | Admitting: *Deleted

## 2020-07-21 VITALS — BP 126/68 | HR 62 | Ht 64.0 in | Wt 140.0 lb

## 2020-07-21 DIAGNOSIS — I2510 Atherosclerotic heart disease of native coronary artery without angina pectoris: Secondary | ICD-10-CM | POA: Diagnosis not present

## 2020-07-21 LAB — LIPID PANEL
Chol/HDL Ratio: 2.2 ratio (ref 0.0–4.4)
Cholesterol, Total: 167 mg/dL (ref 100–199)
HDL: 76 mg/dL (ref >39)
LDL Chol Calc (NIH): 74 mg/dL (ref 0–99)
Triglycerides: 91 mg/dL (ref 0–149)
VLDL Cholesterol Cal: 17 mg/dL (ref 5–40)

## 2020-07-21 NOTE — Patient Instructions (Signed)
Medication Instructions:  No changes *If you need a refill on your cardiac medications before your next appointment, please call your pharmacy*   Lab Work: Today: lipids  If you have labs (blood work) drawn today and your tests are completely normal, you will receive your results only by: Marland Kitchen MyChart Message (if you have MyChart) OR . A paper copy in the mail If you have any lab test that is abnormal or we need to change your treatment, we will call you to review the results.   Testing/Procedures: Your physician has requested that you have en exercise stress myoview. For further information please visit [email or web address]. Please follow instruction sheet, as given.   Follow-Up: At [facility], you and your health needs are our priority.  As part of our continuing mission to provide you with exceptional heart care, we have created designated Provider Care Teams.  These Care Teams include your primary Cardiologist (physician) and Advanced Practice Providers (APPs -  Physician Assistants and Nurse Practitioners) who all work together to provide you with the care you need, when you need it.   Your next appointment:   12 month(s)  The format for your next appointment:   In Person  Provider:   You may see Dorris Carnes, MD or one of the following Advanced Practice Providers on your designated Care Team:    Richardson Dopp, PA-C  [place], [place]

## 2020-07-23 ENCOUNTER — Telehealth: Payer: Self-pay | Admitting: Internal Medicine

## 2020-07-23 ENCOUNTER — Telehealth (HOSPITAL_COMMUNITY): Payer: Self-pay | Admitting: *Deleted

## 2020-07-23 ENCOUNTER — Other Ambulatory Visit (HOSPITAL_COMMUNITY): Payer: Self-pay

## 2020-07-23 DIAGNOSIS — E785 Hyperlipidemia, unspecified: Secondary | ICD-10-CM

## 2020-07-23 DIAGNOSIS — I2510 Atherosclerotic heart disease of native coronary artery without angina pectoris: Secondary | ICD-10-CM

## 2020-07-23 DIAGNOSIS — Z79899 Other long term (current) drug therapy: Secondary | ICD-10-CM

## 2020-07-23 MED ORDER — ROSUVASTATIN CALCIUM 40 MG PO TABS
40.0000 mg | ORAL_TABLET | Freq: Every day | ORAL | 3 refills | Status: DC
  Filled 2020-07-23: qty 90, 90d supply, fill #0
  Filled 2020-11-29: qty 90, 90d supply, fill #1
  Filled 2021-04-30: qty 90, 90d supply, fill #2

## 2020-07-23 NOTE — Telephone Encounter (Signed)
Patient given detailed instructions per Myocardial Perfusion Study Information Sheet for the test on 07/27/2020 at 7:30. Patient notified to arrive 15 minutes early and that it is imperative to arrive on time for appointment to keep from having the test rescheduled.  If you need to cancel or reschedule your appointment, please call the office within 24 hours of your appointment. . Patient verbalized understanding.[name]

## 2020-07-23 NOTE — Telephone Encounter (Signed)
Pt verbalized understanding of her lab results and will return mid August for repeat fasting labs.   LDL is 74  Goal, given CAD hx is that LDL should be less than 70 I would icrease Crestor to 40 mg   F/U lipids in 3 months with AST

## 2020-07-23 NOTE — Telephone Encounter (Signed)
    Pt is returning call to get lab result, pt said to call her work #

## 2020-07-27 ENCOUNTER — Ambulatory Visit (HOSPITAL_COMMUNITY): Payer: 59 | Attending: Cardiology

## 2020-07-27 ENCOUNTER — Other Ambulatory Visit: Payer: Self-pay

## 2020-07-27 DIAGNOSIS — I2510 Atherosclerotic heart disease of native coronary artery without angina pectoris: Secondary | ICD-10-CM | POA: Insufficient documentation

## 2020-07-27 LAB — MYOCARDIAL PERFUSION IMAGING
Estimated workload: 7.1 METS
Exercise duration (min): 6 min
Exercise duration (sec): 6 s
LV dias vol: 53 mL (ref 46–106)
LV sys vol: 12 mL
MPHR: 146 {beats}/min
Peak HR: 133 {beats}/min
Percent HR: 91 %
RPE: 18
Rest HR: 62 {beats}/min
SDS: 3
SRS: 2
SSS: 5
TID: 0.9

## 2020-07-27 MED ORDER — TECHNETIUM TC 99M TETROFOSMIN IV KIT
10.4000 | PACK | Freq: Once | INTRAVENOUS | Status: AC | PRN
  Administered 2020-07-27: 10.4 via INTRAVENOUS
  Filled 2020-07-27: qty 11

## 2020-07-27 MED ORDER — TECHNETIUM TC 99M TETROFOSMIN IV KIT
31.3000 | PACK | Freq: Once | INTRAVENOUS | Status: AC | PRN
Start: 2020-07-27 — End: 2020-07-27
  Administered 2020-07-27: 31.3 via INTRAVENOUS
  Filled 2020-07-27: qty 32

## 2020-07-28 ENCOUNTER — Encounter: Payer: Self-pay | Admitting: Internal Medicine

## 2020-08-22 MED FILL — Aspirin Tab Delayed Release 81 MG: ORAL | 90 days supply | Qty: 90 | Fill #0 | Status: AC

## 2020-08-24 ENCOUNTER — Other Ambulatory Visit (HOSPITAL_COMMUNITY): Payer: Self-pay

## 2020-08-25 ENCOUNTER — Other Ambulatory Visit (HOSPITAL_COMMUNITY): Payer: Self-pay

## 2020-08-27 ENCOUNTER — Other Ambulatory Visit (HOSPITAL_COMMUNITY): Payer: Self-pay

## 2020-10-07 ENCOUNTER — Other Ambulatory Visit: Payer: Self-pay

## 2020-10-07 ENCOUNTER — Ambulatory Visit: Payer: 59 | Admitting: Dermatology

## 2020-10-07 DIAGNOSIS — D2339 Other benign neoplasm of skin of other parts of face: Secondary | ICD-10-CM

## 2020-10-07 DIAGNOSIS — L821 Other seborrheic keratosis: Secondary | ICD-10-CM | POA: Diagnosis not present

## 2020-10-07 DIAGNOSIS — D485 Neoplasm of uncertain behavior of skin: Secondary | ICD-10-CM

## 2020-10-07 DIAGNOSIS — D229 Melanocytic nevi, unspecified: Secondary | ICD-10-CM

## 2020-10-07 NOTE — Patient Instructions (Signed)

## 2020-10-13 ENCOUNTER — Telehealth: Payer: Self-pay

## 2020-10-13 NOTE — Telephone Encounter (Signed)
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  While it may locally recur, it is not considered to be a type of skin cancer.  No further surgery is routinely needed.

## 2020-10-13 NOTE — Telephone Encounter (Signed)
Phone call to patient with her pathology results. Voicemail left for patient to give the office a call back.  

## 2020-10-14 NOTE — Telephone Encounter (Signed)
Patient is returning phone about pathology results from last visit with Lavonna Monarch, M.D.  Per patient okay to call her at work [phone].

## 2020-10-15 ENCOUNTER — Other Ambulatory Visit (HOSPITAL_COMMUNITY): Payer: Self-pay

## 2020-10-15 ENCOUNTER — Other Ambulatory Visit: Payer: Self-pay | Admitting: Internal Medicine

## 2020-10-15 DIAGNOSIS — I10 Essential (primary) hypertension: Secondary | ICD-10-CM

## 2020-10-15 DIAGNOSIS — I2510 Atherosclerotic heart disease of native coronary artery without angina pectoris: Secondary | ICD-10-CM

## 2020-10-15 MED ORDER — IRBESARTAN 150 MG PO TABS
ORAL_TABLET | Freq: Every day | ORAL | 1 refills | Status: DC
  Filled 2020-10-15: qty 90, 90d supply, fill #0
  Filled 2021-01-30: qty 90, 90d supply, fill #1

## 2020-10-15 MED FILL — Amlodipine Besylate Tab 5 MG (Base Equivalent): ORAL | 90 days supply | Qty: 90 | Fill #1 | Status: AC

## 2020-10-15 NOTE — Telephone Encounter (Signed)
Calling with pathology results- no answer - voicemail full

## 2020-10-26 ENCOUNTER — Encounter: Payer: Self-pay | Admitting: Dermatology

## 2020-10-26 NOTE — Progress Notes (Signed)
   New Patient   Subjective  [name] is a 75 y.o. female who presents for the following: Skin Problem (Here to check mole on face x years. Had it removed but it came back.).  Growth left forehead, possibly recurrent, check spots on back Location:  Duration:  Quality:  Associated Signs/Symptoms: Modifying Factors:  Severity:  Timing: Context:    The following portions of the chart were reviewed this encounter and updated as appropriate:  Tobacco  Allergies  Meds  Problems  Med Hx  Surg Hx  Fam Hx      Objective  Well appearing patient in no apparent distress; mood and affect are within normal limits. Left Forehead 7 mm pearly pink papule; dermoscopy shows pink amorphous plus globular background with telangiectasia; rule out BCC       Torso - Posterior (Back) Multiple 4 to 49m brown textured flattopped papules  Torso - Posterior (Back) Waist up skin examination, no atypical pigmented lesions.    All skin waist up examined.  Areas beneath undergarments not fully examined.   Assessment & Plan  Neoplasm of uncertain behavior of skin Left Forehead  Skin / nail biopsy Type of biopsy: tangential   Informed consent: discussed and consent obtained   Timeout: patient name, date of birth, surgical site, and procedure verified   Procedure prep:  Patient was prepped and draped in usual sterile fashion (Non sterile) Prep type:  Chlorhexidine Anesthesia: the lesion was anesthetized in a standard fashion   Anesthetic:  1% lidocaine w/ epinephrine 1-100,000 local infiltration Instrument used: flexible razor blade   Hemostasis achieved with: ferric subsulfate and electrodesiccation   Outcome: patient tolerated procedure well   Post-procedure details: sterile dressing applied and wound care instructions given   Dressing type: bandage and petrolatum    Specimen 1 - Surgical pathology Differential Diagnosis: R/O BCC vs SCC - cautery after biopsy  Check Margins:  No  Seborrheic keratosis Torso - Posterior (Back)  Benign okay to leave if stable  Nevus Torso - Posterior (Back)  Annual skin examination.

## 2020-11-02 DIAGNOSIS — H524 Presbyopia: Secondary | ICD-10-CM | POA: Diagnosis not present

## 2020-11-04 ENCOUNTER — Other Ambulatory Visit: Payer: 59

## 2020-11-05 ENCOUNTER — Other Ambulatory Visit: Payer: 59

## 2020-11-12 ENCOUNTER — Other Ambulatory Visit: Payer: Self-pay

## 2020-11-12 ENCOUNTER — Other Ambulatory Visit: Payer: 59

## 2020-11-12 DIAGNOSIS — I2510 Atherosclerotic heart disease of native coronary artery without angina pectoris: Secondary | ICD-10-CM

## 2020-11-12 DIAGNOSIS — Z79899 Other long term (current) drug therapy: Secondary | ICD-10-CM

## 2020-11-12 DIAGNOSIS — E785 Hyperlipidemia, unspecified: Secondary | ICD-10-CM | POA: Diagnosis not present

## 2020-11-12 LAB — LIPID PANEL
Chol/HDL Ratio: 2 ratio (ref 0.0–4.4)
Cholesterol, Total: 149 mg/dL (ref 100–199)
HDL: 73 mg/dL (ref >39)
LDL Chol Calc (NIH): 61 mg/dL (ref 0–99)
Triglycerides: 78 mg/dL (ref 0–149)
VLDL Cholesterol Cal: 15 mg/dL (ref 5–40)

## 2020-11-12 LAB — AST: AST: 22 IU/L (ref 0–40)

## 2020-11-29 ENCOUNTER — Other Ambulatory Visit: Payer: Self-pay | Admitting: Internal Medicine

## 2020-11-29 DIAGNOSIS — I2510 Atherosclerotic heart disease of native coronary artery without angina pectoris: Secondary | ICD-10-CM

## 2020-11-29 MED ORDER — ASPIRIN 81 MG PO TBEC
DELAYED_RELEASE_TABLET | ORAL | 1 refills | Status: DC
Start: 2020-11-29 — End: 2021-06-11
  Filled 2020-11-29: qty 90, 90d supply, fill #0
  Filled 2021-03-16: qty 90, 90d supply, fill #1

## 2020-11-30 ENCOUNTER — Other Ambulatory Visit (HOSPITAL_COMMUNITY): Payer: Self-pay

## 2021-01-30 ENCOUNTER — Other Ambulatory Visit (HOSPITAL_COMMUNITY): Payer: Self-pay

## 2021-01-30 ENCOUNTER — Other Ambulatory Visit: Payer: Self-pay | Admitting: Internal Medicine

## 2021-02-01 ENCOUNTER — Other Ambulatory Visit (HOSPITAL_COMMUNITY): Payer: Self-pay

## 2021-02-01 MED ORDER — AMLODIPINE BESYLATE 5 MG PO TABS
ORAL_TABLET | Freq: Every day | ORAL | 1 refills | Status: DC
  Filled 2021-02-01: qty 90, 90d supply, fill #0
  Filled 2021-04-30: qty 90, 90d supply, fill #1

## 2021-02-03 ENCOUNTER — Other Ambulatory Visit (HOSPITAL_COMMUNITY): Payer: Self-pay

## 2021-03-01 ENCOUNTER — Encounter: Payer: Self-pay | Admitting: Internal Medicine

## 2021-03-01 ENCOUNTER — Ambulatory Visit: Payer: 59 | Admitting: Internal Medicine

## 2021-03-01 ENCOUNTER — Other Ambulatory Visit: Payer: Self-pay

## 2021-03-01 DIAGNOSIS — J069 Acute upper respiratory infection, unspecified: Secondary | ICD-10-CM | POA: Diagnosis not present

## 2021-03-01 LAB — POC COVID19 BINAXNOW: SARS Coronavirus 2 Ag: NEGATIVE

## 2021-03-01 NOTE — Assessment & Plan Note (Signed)
Acute, viral Feeling better Voice rest if hoarse  Use over-the-counter  "cold" medicines. Avoid decongestants if you have high blood pressure and use "Afrin" nasal spray for nasal congestion as directed. Use " Delsym" or" Robitussin" cough syrup varietis for cough.  You can use plain "Tylenol" or "Advil" for fever, chills and achyness. Use Halls or Ricola cough drops.   "Common cold" symptoms are usually triggered by a virus.  The antibiotics are usually not necessary. On average, a" viral cold" illness would take 4-7 days to resolve.   Please, make an appointment if you are not better or if you're worse.

## 2021-03-01 NOTE — Patient Instructions (Signed)
You can use over-the-counter  "Afrin" nasal spray for nasal congestion as directed. Use " Delsym" or" Robitussin" cough syrup varietis for cough.  You can use plain "Tylenol" or "Advil" for fever, chills and achyness. Use Halls or Ricola cough drops.  "Common cold" symptoms are usually triggered by a virus.  The antibiotics are usually not necessary. On average, a" viral cold" illness would take 4-7 days to resolve.  Please, make an appointment if you are not better or if you're worse.

## 2021-03-01 NOTE — Progress Notes (Signed)
Subjective:  Patient ID: [name], female    DOB: 1945-04-25  Age: 75 y.o. MRN: [id number]  CC: Cough (Pt states she took home covid test was NEGATIVE) and Laryngitis   HPI [name] presents for URI since Sat: needs a COVID test for work C/o voice loss, dry  Outpatient Medications Prior to Visit  Medication Sig Dispense Refill   amLODipine (NORVASC) 5 MG tablet TAKE 1 TABLET BY MOUTH DAILY. 90 tablet 1   aspirin 81 MG EC tablet TAKE 1 TABLET BY MOUTH ONCE DAILY (SWALLOW WHOLE) 90 tablet 1   Cholecalciferol 50 MCG (2000 UT) TABS Take 2 tablets (4,000 Units total) by mouth daily. 180 tablet 1   irbesartan (AVAPRO) 150 MG tablet TAKE 1 TABLET BY MOUTH DAILY. 90 tablet 1   nitroGLYCERIN (NITROSTAT) 0.4 MG SL tablet PLACE 1 TABLET (0.4 MG TOTAL) UNDER THE TONGUE EVERY 5 (FIVE) MINUTES AS NEEDED FOR CHEST PAIN (X3 DOSES BEFORE CALLING 911). 25 tablet 6   rosuvastatin (CRESTOR) 40 MG tablet Take 1 tablet (40 mg total) by mouth daily. 90 tablet 3   No facility-administered medications prior to visit.    ROS: Review of Systems  Constitutional:  Negative for activity change, appetite change, chills, fatigue and unexpected weight change.  HENT:  Positive for congestion and voice change. Negative for mouth sores and sinus pressure.   Eyes:  Negative for visual disturbance.  Respiratory:  Positive for cough. Negative for chest tightness and wheezing.   Gastrointestinal:  Negative for abdominal pain and nausea.  Genitourinary:  Negative for difficulty urinating, frequency and vaginal pain.  Musculoskeletal:  Negative for back pain and gait problem.  Skin:  Negative for pallor and rash.  Neurological:  Negative for dizziness, tremors, weakness, numbness and headaches.  Psychiatric/Behavioral:  Negative for confusion and sleep disturbance.    Objective:  BP (!) 162/76 (BP Location: Left Arm)    Pulse 69    Temp 97.8 F (36.6 C) (Oral)    Ht 5\' 4"  (1.626 m)    Wt 134 lb 12.8 oz  (61.1 kg)    SpO2 98%    BMI 23.14 kg/m   BP Readings from Last 3 Encounters:  03/01/21 (!) 162/76  07/21/20 126/68  04/07/20 126/74    Wt Readings from Last 3 Encounters:  03/01/21 134 lb 12.8 oz (61.1 kg)  07/27/20 140 lb (63.5 kg)  07/21/20 140 lb (63.5 kg)    Physical Exam Constitutional:      General: She is not in acute distress.    Appearance: She is well-developed. She is not toxic-appearing.  HENT:     Head: Normocephalic.     Right Ear: External ear normal.     Left Ear: External ear normal.     Nose: Nose normal.  Eyes:     General:        Right eye: No discharge.        Left eye: No discharge.     Conjunctiva/sclera: Conjunctivae normal.     Pupils: Pupils are equal, round, and reactive to light.  Neck:     Thyroid: No thyromegaly.     Vascular: No JVD.     Trachea: No tracheal deviation.  Cardiovascular:     Rate and Rhythm: Normal rate and regular rhythm.     Heart sounds: Normal heart sounds.  Pulmonary:     Effort: No respiratory distress.     Breath sounds: No stridor. No wheezing.  Abdominal:  General: Bowel sounds are normal. There is no distension.     Palpations: Abdomen is soft. There is no mass.     Tenderness: There is no abdominal tenderness. There is no guarding or rebound.  Musculoskeletal:        General: No tenderness.     Cervical back: Normal range of motion and neck supple. No rigidity.  Lymphadenopathy:     Cervical: No cervical adenopathy.  Skin:    Findings: No erythema or rash.  Neurological:     Cranial Nerves: No cranial nerve deficit.     Motor: No abnormal muscle tone.     Coordination: Coordination normal.     Deep Tendon Reflexes: Reflexes normal.  Psychiatric:        Behavior: Behavior normal.        Thought Content: Thought content normal.        Judgment: Judgment normal.  Eryth throat  Lab Results  Component Value Date   WBC 8.3 04/07/2020   HGB 12.9 04/07/2020   HCT 37.3 04/07/2020   PLT 184.0  04/07/2020   GLUCOSE 66 (L) 04/07/2020   CHOL 149 11/12/2020   TRIG 78 11/12/2020   HDL 73 11/12/2020   LDLCALC 61 11/12/2020   ALT 15 09/05/2019   AST 22 11/12/2020   NA 139 04/07/2020   K 3.8 04/07/2020   CL 104 04/07/2020   CREATININE 0.99 04/07/2020   BUN 14 04/07/2020   CO2 29 04/07/2020   TSH 2.46 09/05/2019    DG Bone Density  Result Date: 05/09/2017 Date of study: 05/09/2017 Exam: DUAL X-RAY ABSORPTIOMETRY (DXA) FOR BONE MINERAL DENSITY (BMD) Instrument: [employer] Provider: PCP Indication: follow up for low BMD Comparison: none (please note that it is not possible to compare data from different instruments) Clinical data: Pt is a 75 y.o. female without previous history of fracture. On calcium and vitamin D. Results:  Lumbar spine L1-L4 Femoral neck (FN) T-score 0.0 RFN: -1.1 LFN: -1.2 Assessment: the BMD is low according to the [facility] classification for osteoporosis (see below). Fracture risk: moderate FRAX score: 10 year major osteoporotic risk: 9.5 %. 10 year hip fracture risk: 1.2 %. The thresholds for treatment are 20% and 3%, respectively. Comments: the technical quality of the study is good. Evaluation for secondary causes should be considered if clinically indicated. Recommend optimizing calcium (1200 mg/day) and vitamin D (800 IU/day) intake. Followup: Repeat BMD is appropriate after 2 years. WHO criteria for diagnosis of osteoporosis in postmenopausal women and in men 2 y/o or older: - normal: T-score -1.0 to + 1.0 - osteopenia/low bone density: T-score between -2.5 and -1.0 - osteoporosis: T-score below -2.5 - severe osteoporosis: T-score below -2.5 with history of fragility fracture Note: although not part of the WHO classification, the presence of a fragility fracture, regardless of the T-score, should be considered diagnostic of osteoporosis, provided other causes for the fracture have been excluded. Treatment: The National Osteoporosis Foundation recommends  that treatment be considered in postmenopausal women and men age 72 or older with: 1. Hip or vertebral (clinical or morphometric) fracture 2. T-score of - 2.5 or lower at the spine or hip 3. 10-year fracture probability by FRAX of at least 20% for a major osteoporotic fracture and 3% for a hip fracture Philemon Kingdom, MD [facility]    Assessment & Plan:   Problem List Items Addressed This Visit     Upper respiratory infection    Acute, viral Feeling better Voice rest if  hoarse  Use over-the-counter  "cold" medicines. Avoid decongestants if you have high blood pressure and use "Afrin" nasal spray for nasal congestion as directed. Use " Delsym" or" Robitussin" cough syrup varietis for cough.  You can use plain "Tylenol" or "Advil" for fever, chills and achyness. Use Halls or Ricola cough drops.   "Common cold" symptoms are usually triggered by a virus.  The antibiotics are usually not necessary. On average, a" viral cold" illness would take 4-7 days to resolve.   Please, make an appointment if you are not better or if you're worse.       Relevant Orders   POC COVID-19 BinaxNow (Completed)    POC COVID (-) today  No orders of the defined types were placed in this encounter.     Follow-up: No follow-ups on file.  Walker Kehr, MD

## 2021-03-03 DIAGNOSIS — Z1231 Encounter for screening mammogram for malignant neoplasm of breast: Secondary | ICD-10-CM | POA: Diagnosis not present

## 2021-03-03 DIAGNOSIS — Z01419 Encounter for gynecological examination (general) (routine) without abnormal findings: Secondary | ICD-10-CM | POA: Diagnosis not present

## 2021-03-03 DIAGNOSIS — Z1389 Encounter for screening for other disorder: Secondary | ICD-10-CM | POA: Diagnosis not present

## 2021-03-03 DIAGNOSIS — Z130 Encounter for screening for diseases of the blood and blood-forming organs and certain disorders involving the immune mechanism: Secondary | ICD-10-CM | POA: Diagnosis not present

## 2021-03-17 ENCOUNTER — Other Ambulatory Visit (HOSPITAL_COMMUNITY): Payer: Self-pay

## 2021-04-05 ENCOUNTER — Other Ambulatory Visit: Payer: Self-pay

## 2021-04-05 ENCOUNTER — Encounter: Payer: Self-pay | Admitting: Internal Medicine

## 2021-04-05 ENCOUNTER — Ambulatory Visit: Payer: 59 | Admitting: Internal Medicine

## 2021-04-05 VITALS — BP 140/70 | HR 78 | Temp 98.5°F | Ht 64.0 in | Wt 132.0 lb

## 2021-04-05 DIAGNOSIS — E559 Vitamin D deficiency, unspecified: Secondary | ICD-10-CM | POA: Diagnosis not present

## 2021-04-05 DIAGNOSIS — H6123 Impacted cerumen, bilateral: Secondary | ICD-10-CM

## 2021-04-05 DIAGNOSIS — N1832 Chronic kidney disease, stage 3b: Secondary | ICD-10-CM

## 2021-04-05 DIAGNOSIS — I10 Essential (primary) hypertension: Secondary | ICD-10-CM

## 2021-04-05 DIAGNOSIS — E785 Hyperlipidemia, unspecified: Secondary | ICD-10-CM | POA: Diagnosis not present

## 2021-04-05 LAB — TSH: TSH: 4.16 u[IU]/mL (ref 0.35–5.50)

## 2021-04-05 LAB — CBC WITH DIFFERENTIAL/PLATELET
Basophils Absolute: 0.1 10*3/uL (ref 0.0–0.1)
Basophils Relative: 1.2 % (ref 0.0–3.0)
Eosinophils Absolute: 0.5 10*3/uL (ref 0.0–0.7)
Eosinophils Relative: 5.4 % — ABNORMAL HIGH (ref 0.0–5.0)
HCT: 37.1 % (ref 36.0–46.0)
Hemoglobin: 12.5 g/dL (ref 12.0–15.0)
Lymphocytes Relative: 24.8 % (ref 12.0–46.0)
Lymphs Abs: 2.3 10*3/uL (ref 0.7–4.0)
MCHC: 33.7 g/dL (ref 30.0–36.0)
MCV: 91.9 fl (ref 78.0–100.0)
Monocytes Absolute: 0.6 10*3/uL (ref 0.1–1.0)
Monocytes Relative: 6.5 % (ref 3.0–12.0)
Neutro Abs: 5.7 10*3/uL (ref 1.4–7.7)
Neutrophils Relative %: 62.1 % (ref 43.0–77.0)
Platelets: 175 10*3/uL (ref 150.0–400.0)
RBC: 4.03 Mil/uL (ref 3.87–5.11)
RDW: 12.7 % (ref 11.5–15.5)
WBC: 9.1 10*3/uL (ref 4.0–10.5)

## 2021-04-05 LAB — VITAMIN D 25 HYDROXY (VIT D DEFICIENCY, FRACTURES): VITD: 53.16 ng/mL (ref 30.00–100.00)

## 2021-04-05 NOTE — Progress Notes (Signed)
Patient consent obtained. Irrigation with water and peroxide performed. Full view of tympanic membranes after procedure.  Patient tolerated procedure well.   

## 2021-04-05 NOTE — Progress Notes (Signed)
Subjective:  Patient ID: [name], female    DOB: 29-May-1945  Age: 77 y.o. MRN: [id number]  CC: Hypertension  This visit occurred during the SARS-CoV-2 public health emergency.  Safety protocols were in place, including screening questions prior to the visit, additional usage of staff PPE, and extensive cleaning of exam room while observing appropriate contact time as indicated for disinfecting solutions.    HPI [name] presents for f/up -  She complains of a several week history of decreased level of hearing in both ears.  She tells me her blood pressure has been well controlled.  She is active and denies chest pain, shortness of breath, diaphoresis, dizziness, lightheadedness, or edema.  Outpatient Medications Prior to Visit  Medication Sig Dispense Refill   amLODipine (NORVASC) 5 MG tablet TAKE 1 TABLET BY MOUTH DAILY. 90 tablet 1   aspirin 81 MG EC tablet TAKE 1 TABLET BY MOUTH ONCE DAILY (SWALLOW WHOLE) 90 tablet 1   Cholecalciferol 50 MCG (2000 UT) TABS Take 2 tablets (4,000 Units total) by mouth daily. 180 tablet 1   irbesartan (AVAPRO) 150 MG tablet TAKE 1 TABLET BY MOUTH DAILY. 90 tablet 1   nitroGLYCERIN (NITROSTAT) 0.4 MG SL tablet PLACE 1 TABLET (0.4 MG TOTAL) UNDER THE TONGUE EVERY 5 (FIVE) MINUTES AS NEEDED FOR CHEST PAIN (X3 DOSES BEFORE CALLING 911). 25 tablet 6   rosuvastatin (CRESTOR) 40 MG tablet Take 1 tablet (40 mg total) by mouth daily. 90 tablet 3   No facility-administered medications prior to visit.    ROS Review of Systems  Constitutional:  Negative for chills, diaphoresis, fatigue and fever.  HENT:  Positive for hearing loss. Negative for ear pain, sinus pressure and sore throat.   Eyes: Negative.   Respiratory: Negative.  Negative for cough and shortness of breath.   Cardiovascular:  Negative for chest pain, palpitations and leg swelling.  Gastrointestinal:  Negative for abdominal pain, constipation, diarrhea, nausea and vomiting.   Endocrine: Negative.   Genitourinary: Negative.  Negative for difficulty urinating and dysuria.  Musculoskeletal: Negative.  Negative for back pain.  Skin: Negative.   Neurological:  Negative for dizziness, weakness, light-headedness and headaches.  Hematological:  Negative for adenopathy. Does not bruise/bleed easily.  Psychiatric/Behavioral: Negative.     Objective:  BP 140/70 (BP Location: Left Arm, Patient Position: Sitting, Cuff Size: Large)    Pulse 78    Temp 98.5 F (36.9 C) (Oral)    Ht 5\' 4"  (1.626 m)    Wt 132 lb (59.9 kg)    SpO2 95%    BMI 22.66 kg/m   BP Readings from Last 3 Encounters:  04/05/21 140/70  03/01/21 (!) 162/76  07/21/20 126/68    Wt Readings from Last 3 Encounters:  04/05/21 132 lb (59.9 kg)  03/01/21 134 lb 12.8 oz (61.1 kg)  07/27/20 140 lb (63.5 kg)    Physical Exam Vitals reviewed.  HENT:     Right Ear: Decreased hearing noted. No middle ear effusion. There is impacted cerumen. No foreign body. Tympanic membrane is not injected.     Left Ear: Decreased hearing noted.  No middle ear effusion. There is impacted cerumen. No foreign body. Tympanic membrane is not injected.     Ears:     Comments: I put Colace in both EACs and irrigated them using water and an ear pick.  The cerumen was successfully removed.  She tolerated this well.  Hearing has returned to normal.  The examination afterwards is  normal.    Mouth/Throat:     Mouth: Mucous membranes are moist.  Eyes:     General: No scleral icterus.    Conjunctiva/sclera: Conjunctivae normal.  Cardiovascular:     Rate and Rhythm: Normal rate and regular rhythm.     Heart sounds: No murmur heard. Pulmonary:     Effort: Pulmonary effort is normal.     Breath sounds: No stridor. No wheezing, rhonchi or rales.  Abdominal:     General: Abdomen is flat.     Palpations: There is no mass.     Tenderness: There is no abdominal tenderness. There is no guarding.     Hernia: No hernia is present.   Musculoskeletal:        General: Normal range of motion.     Cervical back: Neck supple.     Right lower leg: No edema.     Left lower leg: No edema.  Lymphadenopathy:     Cervical: No cervical adenopathy.  Skin:    General: Skin is warm and dry.  Neurological:     General: No focal deficit present.     Mental Status: She is alert.  Psychiatric:        Mood and Affect: Mood normal.        Behavior: Behavior normal.    Lab Results  Component Value Date   WBC 9.1 04/05/2021   HGB 12.5 04/05/2021   HCT 37.1 04/05/2021   PLT 175.0 04/05/2021   GLUCOSE 70 04/05/2021   CHOL 149 11/12/2020   TRIG 78 11/12/2020   HDL 73 11/12/2020   LDLCALC 61 11/12/2020   ALT 14 04/05/2021   AST 20 04/05/2021   NA 142 04/05/2021   K 4.0 04/05/2021   CL 105 04/05/2021   CREATININE 1.05 04/05/2021   BUN 15 04/05/2021   CO2 26 04/05/2021   TSH 4.16 04/05/2021    DG Bone Density  Result Date: 05/09/2017 Date of study: 05/09/2017 Exam: DUAL X-RAY ABSORPTIOMETRY (DXA) FOR BONE MINERAL DENSITY (BMD) Instrument: [employer] Provider: PCP Indication: follow up for low BMD Comparison: none (please note that it is not possible to compare data from different instruments) Clinical data: Pt is a 76 y.o. female without previous history of fracture. On calcium and vitamin D. Results:  Lumbar spine L1-L4 Femoral neck (FN) T-score 0.0 RFN: -1.1 LFN: -1.2 Assessment: the BMD is low according to the [facility] classification for osteoporosis (see below). Fracture risk: moderate FRAX score: 10 year major osteoporotic risk: 9.5 %. 10 year hip fracture risk: 1.2 %. The thresholds for treatment are 20% and 3%, respectively. Comments: the technical quality of the study is good. Evaluation for secondary causes should be considered if clinically indicated. Recommend optimizing calcium (1200 mg/day) and vitamin D (800 IU/day) intake. Followup: Repeat BMD is appropriate after 2 years. WHO criteria for diagnosis of  osteoporosis in postmenopausal women and in men 12 y/o or older: - normal: T-score -1.0 to + 1.0 - osteopenia/low bone density: T-score between -2.5 and -1.0 - osteoporosis: T-score below -2.5 - severe osteoporosis: T-score below -2.5 with history of fragility fracture Note: although not part of the WHO classification, the presence of a fragility fracture, regardless of the T-score, should be considered diagnostic of osteoporosis, provided other causes for the fracture have been excluded. Treatment: The National Osteoporosis Foundation recommends that treatment be considered in postmenopausal women and men age 29 or older with: 1. Hip or vertebral (clinical or morphometric) fracture 2. T-score of -  2.5 or lower at the spine or hip 3. 10-year fracture probability by FRAX of at least 20% for a major osteoporotic fracture and 3% for a hip fracture Philemon Kingdom, MD [facility]    Assessment & Plan:   [name] was seen today for hypertension.  Diagnoses and all orders for this visit:  Essential hypertension- Her blood pressure is adequately well controlled. -     Basic metabolic panel; Future -     CBC with Differential/Platelet; Future -     TSH; Future -     Urinalysis, Routine w reflex microscopic; Future -     Urinalysis, Routine w reflex microscopic -     TSH -     CBC with Differential/Platelet -     Basic metabolic panel  Stage 3b chronic kidney disease ([place])- Her renal function is stable.  She is avoiding nephrotoxic agents. -     Basic metabolic panel; Future -     CBC with Differential/Platelet; Future -     Urinalysis, Routine w reflex microscopic; Future -     Urinalysis, Routine w reflex microscopic -     CBC with Differential/Platelet -     Basic metabolic panel  Vitamin D deficiency disease- Vit D is normal. -     VITAMIN D 25 Hydroxy (Vit-D Deficiency, Fractures); Future -     VITAMIN D 25 Hydroxy (Vit-D Deficiency, Fractures)  Hyperlipidemia LDL goal <70- LDL  goal achieved. Doing well on the statin  -     TSH; Future -     Hepatic function panel; Future -     Hepatic function panel -     TSH  Hearing loss due to cerumen impaction, bilateral- This was successfully treated.   I am having [name] maintain her Cholecalciferol, nitroGLYCERIN, rosuvastatin, irbesartan, aspirin, and amLODipine.  No orders of the defined types were placed in this encounter.    Follow-up: No follow-ups on file.  Karen Calico, MD

## 2021-04-06 LAB — URINALYSIS, ROUTINE W REFLEX MICROSCOPIC
Bilirubin Urine: NEGATIVE
Hgb urine dipstick: NEGATIVE
Ketones, ur: NEGATIVE
Leukocytes,Ua: NEGATIVE
Nitrite: NEGATIVE
Specific Gravity, Urine: 1.02 (ref 1.000–1.030)
Total Protein, Urine: NEGATIVE
Urine Glucose: NEGATIVE
Urobilinogen, UA: 0.2 (ref 0.0–1.0)
pH: 6 (ref 5.0–8.0)

## 2021-04-06 LAB — HEPATIC FUNCTION PANEL
ALT: 14 U/L (ref 0–35)
AST: 20 U/L (ref 0–37)
Albumin: 4.6 g/dL (ref 3.5–5.2)
Alkaline Phosphatase: 82 U/L (ref 39–117)
Bilirubin, Direct: 0.1 mg/dL (ref 0.0–0.3)
Total Bilirubin: 0.6 mg/dL (ref 0.2–1.2)
Total Protein: 7.3 g/dL (ref 6.0–8.3)

## 2021-04-06 LAB — BASIC METABOLIC PANEL
BUN: 15 mg/dL (ref 6–23)
CO2: 26 mEq/L (ref 19–32)
Calcium: 9.4 mg/dL (ref 8.4–10.5)
Chloride: 105 mEq/L (ref 96–112)
Creatinine, Ser: 1.05 mg/dL (ref 0.40–1.20)
GFR: 51.95 mL/min — ABNORMAL LOW (ref >60.00)
Glucose, Bld: 70 mg/dL (ref 70–99)
Potassium: 4 mEq/L (ref 3.5–5.1)
Sodium: 142 mEq/L (ref 135–145)

## 2021-04-07 ENCOUNTER — Encounter: Payer: Self-pay | Admitting: Internal Medicine

## 2021-04-07 NOTE — Patient Instructions (Signed)
Chronic Kidney Disease, Adult Chronic kidney disease (CKD) occurs when the kidneys are slowly and permanently damaged over a long period of time. The kidneys are a pair of organs that do many important jobs in the body, including: Removing waste and extra fluid from the blood to make urine. Making hormones that maintain the amount of fluid in tissues and blood vessels. Maintaining the right amount of fluids and chemicals in the body. A small amount of kidney damage may not cause problems, but a large amount of damage may make it hard or impossible for the kidneys to work right. Steps must be taken to slow kidney damage or to stop it from getting worse. If steps are not taken, the kidneys may stop working permanently (end-stage renal disease, or ESRD). Most of the time, CKD does not go away, but it can often becontrolled. People who have CKD are usually able to live full lives. What are the causes? The most common causes of this condition are diabetes and high blood pressure (hypertension). Other causes include: Cardiovascular diseases. These affect the heart and blood vessels. Kidney diseases. These include: Glomerulonephritis, or inflammation of the tiny filters in the kidneys. Interstitial nephritis. This is swelling of the small tubes of the kidneys and of the surrounding structures. Polycystic kidney disease, in which clusters of fluid-filled sacs form within the kidneys. Renal vascular disease. This includes disorders that affect the arteries and veins of the kidneys. Diseases that affect the body's defense system (immune system). A problem with urine flow. This may be caused by: Kidney stones. Cancer. An enlarged prostate, in males. A kidney infection or urinary tract infection (UTI) that keeps coming back. Vasculitis. This is swelling or inflammation of the blood vessels. What increases the risk? Your chances of having kidney disease increase with age. The following factors may make  you more likely to develop this condition: A family history of kidney disease or kidney failure. Kidney failure means the kidneys can no longer work right. Certain genetic diseases. Taking medicines often that are damaging to the kidneys. Being around or being in contact with toxic substances. Obesity. A history of tobacco use. What are the signs or symptoms? Symptoms of this condition include: Feeling very tired (lethargic) and having less energy. Swelling, or edema, of the face, legs, ankles, or feet. Nausea or vomiting, or loss of appetite. Confusion or trouble concentrating. Muscle twitches and cramps, especially in the legs. Dry, itchy skin. A metallic taste in the mouth. Producing less urine, or producing more urine (especially at night). Shortness of breath. Trouble sleeping. CKD may also result in not having enough red blood cells or hemoglobin in the blood (anemia) or having weak bones (bone disease). Symptoms develop slowly and may not be obvious until the kidney damage becomessevere. It is possible to have kidney disease for years without having symptoms. How is this diagnosed? This condition may be diagnosed based on: Blood tests. Urine tests. Imaging tests, such as an ultrasound or a CT scan. A kidney biopsy. This involves removing a sample of kidney tissue to be looked at under a microscope. Results from these tests will help to determine how serious the CKD is. How is this treated? There is no cure for most cases of this condition, but treatment usually relieves symptoms and prevents or slows the worsening of the disease. Treatment may include: Diet changes, which may require you to avoid alcohol and foods that are high in salt, potassium, phosphorous, and protein. Medicines. These may: Lower blood   pressure. Control blood sugar (glucose). Relieve anemia. Relieve swelling. Protect your bones. Improve the balance of salts and minerals in your blood  (electrolytes). Dialysis, which is a type of treatment that removes toxic waste from the body. It may be needed if you have kidney failure. Managing any other conditions that are causing your CKD or making it worse. Follow these instructions at home: Medicines Take over-the-counter and prescription medicines only as told by your health care provider. The amount of some medicines that you take may need to be changed. Do not take any new medicines unless approved by your health care provider. Many medicines can make kidney damage worse. Do not take any vitamin and mineral supplements unless approved by your health care provider. Many nutritional supplements can make kidney damage worse. Lifestyle  Do not use any products that contain nicotine or tobacco, such as cigarettes, e-cigarettes, and chewing tobacco. If you need help quitting, ask your health care provider. If you drink alcohol: Limit how much you use to: 0-1 drink a day for women who are not pregnant. 0-2 drinks a day for men. Know how much alcohol is in your drink. In the [place] one drink equals one 12 oz bottle of beer (355 mL), one 5 oz glass of wine (148 mL), or one 1 oz glass of hard liquor (44 mL). Maintain a healthy weight. If you need help, ask your health care provider.  General instructions  Follow instructions from your health care provider about eating or drinking restrictions, including any prescribed diet. Track your blood pressure at home. Report changes in your blood pressure as told. If you are being treated for diabetes, track your blood glucose levels as told. Start or continue an exercise plan. Exercise at least 30 minutes a day, 5 days a week. Keep your immunizations up to date as told. Keep all follow-up visits. This is important.  Where to find more information American Association of Kidney Patients: www.aakp.org National Kidney Foundation: www.kidney.org American Kidney Fund: www.akfinc.org Life Options:  www.lifeoptions.org Kidney School: www.kidneyschool.org Contact a health care provider if: Your symptoms get worse. You develop new symptoms. Get help right away if: You develop symptoms of ESRD. These include: Headaches. Numbness in your hands or feet. Easy bruising. Frequent hiccups. Chest pain. Shortness of breath. Lack of menstrual periods, in women. You have a fever. You are producing less urine than usual. You have pain or bleeding when you urinate or when you have a bowel movement. These symptoms may represent a serious problem that is an emergency. Do not wait to see if the symptoms will go away. Get medical help right away. Call your local emergency services (911 in the [place] Do not drive yourself to the hospital. Summary Chronic kidney disease (CKD) occurs when the kidneys become damaged slowly over a long period of time. The most common causes of this condition are diabetes and high blood pressure (hypertension). There is no cure for most cases of CKD, but treatment usually relieves symptoms and prevents or slows the worsening of the disease. Treatment may include a combination of lifestyle changes, medicines, and dialysis. This information is not intended to replace advice given to you by your health care provider. Make sure you discuss any questions you have with your healthcare provider. Document Revised: 06/05/2019 Document Reviewed: 06/05/2019 Elsevier Patient Education  2022 Elsevier Inc.  

## 2021-04-30 ENCOUNTER — Other Ambulatory Visit (HOSPITAL_COMMUNITY): Payer: Self-pay

## 2021-04-30 ENCOUNTER — Other Ambulatory Visit: Payer: Self-pay | Admitting: Internal Medicine

## 2021-04-30 DIAGNOSIS — I10 Essential (primary) hypertension: Secondary | ICD-10-CM

## 2021-04-30 DIAGNOSIS — I2510 Atherosclerotic heart disease of native coronary artery without angina pectoris: Secondary | ICD-10-CM

## 2021-04-30 MED ORDER — IRBESARTAN 150 MG PO TABS
ORAL_TABLET | Freq: Every day | ORAL | 1 refills | Status: DC
  Filled 2021-04-30: qty 90, 90d supply, fill #0
  Filled 2021-08-03: qty 90, 90d supply, fill #1

## 2021-05-04 ENCOUNTER — Other Ambulatory Visit (HOSPITAL_COMMUNITY): Payer: Self-pay

## 2021-06-11 ENCOUNTER — Other Ambulatory Visit: Payer: Self-pay | Admitting: Internal Medicine

## 2021-06-11 ENCOUNTER — Other Ambulatory Visit (HOSPITAL_COMMUNITY): Payer: Self-pay

## 2021-06-11 DIAGNOSIS — I2510 Atherosclerotic heart disease of native coronary artery without angina pectoris: Secondary | ICD-10-CM

## 2021-06-11 MED ORDER — ASPIRIN 81 MG PO TBEC
DELAYED_RELEASE_TABLET | ORAL | 1 refills | Status: DC
  Filled 2021-06-11: qty 90, 90d supply, fill #0
  Filled 2021-09-22: qty 90, 90d supply, fill #1

## 2021-08-03 ENCOUNTER — Other Ambulatory Visit: Payer: Self-pay | Admitting: Internal Medicine

## 2021-08-04 ENCOUNTER — Other Ambulatory Visit (HOSPITAL_COMMUNITY): Payer: Self-pay

## 2021-08-04 MED ORDER — AMLODIPINE BESYLATE 5 MG PO TABS
ORAL_TABLET | Freq: Every day | ORAL | 0 refills | Status: DC
  Filled 2021-08-04: qty 30, 30d supply, fill #0

## 2021-09-22 ENCOUNTER — Other Ambulatory Visit: Payer: Self-pay | Admitting: Internal Medicine

## 2021-09-23 ENCOUNTER — Other Ambulatory Visit (HOSPITAL_COMMUNITY): Payer: Self-pay

## 2021-09-23 MED ORDER — AMLODIPINE BESYLATE 5 MG PO TABS
ORAL_TABLET | Freq: Every day | ORAL | 0 refills | Status: DC
  Filled 2021-09-23: qty 90, 90d supply, fill #0

## 2021-09-23 MED ORDER — ROSUVASTATIN CALCIUM 40 MG PO TABS
40.0000 mg | ORAL_TABLET | Freq: Every day | ORAL | 0 refills | Status: DC
  Filled 2021-09-23: qty 90, 90d supply, fill #0

## 2021-09-24 ENCOUNTER — Other Ambulatory Visit (HOSPITAL_COMMUNITY): Payer: Self-pay

## 2021-10-21 ENCOUNTER — Ambulatory Visit (INDEPENDENT_AMBULATORY_CARE_PROVIDER_SITE_OTHER): Payer: 59 | Admitting: Internal Medicine

## 2021-10-21 ENCOUNTER — Encounter: Payer: Self-pay | Admitting: Internal Medicine

## 2021-10-21 VITALS — BP 158/64 | HR 60 | Temp 98.1°F | Resp 16 | Ht 64.0 in | Wt 128.6 lb

## 2021-10-21 DIAGNOSIS — Z0000 Encounter for general adult medical examination without abnormal findings: Secondary | ICD-10-CM | POA: Diagnosis not present

## 2021-10-21 DIAGNOSIS — E785 Hyperlipidemia, unspecified: Secondary | ICD-10-CM | POA: Diagnosis not present

## 2021-10-21 DIAGNOSIS — E559 Vitamin D deficiency, unspecified: Secondary | ICD-10-CM | POA: Diagnosis not present

## 2021-10-21 DIAGNOSIS — M8589 Other specified disorders of bone density and structure, multiple sites: Secondary | ICD-10-CM | POA: Diagnosis not present

## 2021-10-21 DIAGNOSIS — I10 Essential (primary) hypertension: Secondary | ICD-10-CM | POA: Diagnosis not present

## 2021-10-21 DIAGNOSIS — Z23 Encounter for immunization: Secondary | ICD-10-CM

## 2021-10-21 DIAGNOSIS — I119 Hypertensive heart disease without heart failure: Secondary | ICD-10-CM

## 2021-10-21 DIAGNOSIS — N1832 Chronic kidney disease, stage 3b: Secondary | ICD-10-CM

## 2021-10-21 DIAGNOSIS — R9431 Abnormal electrocardiogram [ECG] [EKG]: Secondary | ICD-10-CM

## 2021-10-21 LAB — BASIC METABOLIC PANEL
BUN: 14 mg/dL (ref 6–23)
CO2: 29 mEq/L (ref 19–32)
Calcium: 9.2 mg/dL (ref 8.4–10.5)
Chloride: 102 mEq/L (ref 96–112)
Creatinine, Ser: 1.16 mg/dL (ref 0.40–1.20)
GFR: 45.92 mL/min — ABNORMAL LOW (ref >60.00)
Glucose, Bld: 80 mg/dL (ref 70–99)
Potassium: 4 mEq/L (ref 3.5–5.1)
Sodium: 137 mEq/L (ref 135–145)

## 2021-10-21 LAB — CBC WITH DIFFERENTIAL/PLATELET
Basophils Absolute: 0.1 10*3/uL (ref 0.0–0.1)
Basophils Relative: 1 % (ref 0.0–3.0)
Eosinophils Absolute: 0.5 10*3/uL (ref 0.0–0.7)
Eosinophils Relative: 6.3 % — ABNORMAL HIGH (ref 0.0–5.0)
HCT: 36.9 % (ref 36.0–46.0)
Hemoglobin: 12.6 g/dL (ref 12.0–15.0)
Lymphocytes Relative: 22.1 % (ref 12.0–46.0)
Lymphs Abs: 1.8 10*3/uL (ref 0.7–4.0)
MCHC: 34.2 g/dL (ref 30.0–36.0)
MCV: 93.5 fl (ref 78.0–100.0)
Monocytes Absolute: 0.5 10*3/uL (ref 0.1–1.0)
Monocytes Relative: 6.2 % (ref 3.0–12.0)
Neutro Abs: 5.2 10*3/uL (ref 1.4–7.7)
Neutrophils Relative %: 64.4 % (ref 43.0–77.0)
Platelets: 165 10*3/uL (ref 150.0–400.0)
RBC: 3.95 Mil/uL (ref 3.87–5.11)
RDW: 13 % (ref 11.5–15.5)
WBC: 8.1 10*3/uL (ref 4.0–10.5)

## 2021-10-21 LAB — URINALYSIS, ROUTINE W REFLEX MICROSCOPIC
Bilirubin Urine: NEGATIVE
Hgb urine dipstick: NEGATIVE
Ketones, ur: NEGATIVE
Nitrite: NEGATIVE
RBC / HPF: NONE SEEN (ref 0–?)
Specific Gravity, Urine: <=1.005 — AB (ref 1.000–1.030)
Total Protein, Urine: NEGATIVE
Urine Glucose: NEGATIVE
Urobilinogen, UA: 0.2 (ref 0.0–1.0)
pH: 6.5 (ref 5.0–8.0)

## 2021-10-21 LAB — VITAMIN D 25 HYDROXY (VIT D DEFICIENCY, FRACTURES): VITD: 38.44 ng/mL (ref 30.00–100.00)

## 2021-10-21 LAB — HEPATIC FUNCTION PANEL
ALT: 17 U/L (ref 0–35)
AST: 24 U/L (ref 0–37)
Albumin: 4.6 g/dL (ref 3.5–5.2)
Alkaline Phosphatase: 74 U/L (ref 39–117)
Bilirubin, Direct: 0.1 mg/dL (ref 0.0–0.3)
Total Bilirubin: 0.7 mg/dL (ref 0.2–1.2)
Total Protein: 7.1 g/dL (ref 6.0–8.3)

## 2021-10-21 LAB — LIPID PANEL
Cholesterol: 140 mg/dL (ref 0–200)
HDL: 65.8 mg/dL (ref >39.00)
LDL Cholesterol: 59 mg/dL (ref 0–99)
NonHDL: 74.01
Total CHOL/HDL Ratio: 2
Triglycerides: 77 mg/dL (ref 0.0–149.0)
VLDL: 15.4 mg/dL (ref 0.0–40.0)

## 2021-10-21 LAB — TSH: TSH: 2.98 u[IU]/mL (ref 0.35–5.50)

## 2021-10-21 MED ORDER — AMLODIPINE BESYLATE 10 MG PO TABS
10.0000 mg | ORAL_TABLET | Freq: Every day | ORAL | 1 refills | Status: DC
  Filled 2021-10-21: qty 90, 90d supply, fill #0
  Filled 2022-01-10: qty 90, 90d supply, fill #1

## 2021-10-21 NOTE — Progress Notes (Addendum)
Subjective:  Patient ID: [name], female    DOB: 1945/11/20  Age: 76 y.o. MRN: [id number]  CC: Annual Exam (Discuss vitamin d dose) and Hypertension   HPI [name] presents for a CPX and f/up -   She has not used NTG.  She walks about 20 minutes a day and does not experience chest pain, shortness of breath, diaphoresis, edema, or fatigue.  Outpatient Medications Prior to Visit  Medication Sig Dispense Refill   aspirin EC 81 MG tablet TAKE 1 TABLET BY MOUTH ONCE DAILY (SWALLOW WHOLE) 90 tablet 1   cholecalciferol (VITAMIN D3) 25 MCG (1000 UNIT) tablet Take 1,000 Units by mouth daily.     irbesartan (AVAPRO) 150 MG tablet TAKE 1 TABLET BY MOUTH DAILY. 90 tablet 1   nitroGLYCERIN (NITROSTAT) 0.4 MG SL tablet PLACE 1 TABLET (0.4 MG TOTAL) UNDER THE TONGUE EVERY 5 (FIVE) MINUTES AS NEEDED FOR CHEST PAIN (X3 DOSES BEFORE CALLING 911). 25 tablet 6   rosuvastatin (CRESTOR) 40 MG tablet Take 1 tablet (40 mg total) by mouth daily. 90 tablet 0   amLODipine (NORVASC) 5 MG tablet TAKE 1 TABLET BY MOUTH DAILY. 90 tablet 0   Cholecalciferol 50 MCG (2000 UT) TABS Take 2 tablets (4,000 Units total) by mouth daily. 180 tablet 1   No facility-administered medications prior to visit.    ROS Review of Systems  Constitutional: Negative.  Negative for chills, diaphoresis, fatigue and fever.  HENT: Negative.    Eyes: Negative.   Respiratory:  Negative for cough, chest tightness, shortness of breath and wheezing.   Cardiovascular:  Negative for chest pain, palpitations and leg swelling.  Gastrointestinal:  Negative for abdominal pain, constipation, diarrhea, nausea and vomiting.  Endocrine: Negative.   Genitourinary:  Negative for difficulty urinating.  Musculoskeletal:  Positive for arthralgias. Negative for myalgias.  Skin: Negative.   Neurological: Negative.  Negative for dizziness, weakness and light-headedness.  Hematological:  Negative for adenopathy. Does not bruise/bleed easily.   Psychiatric/Behavioral: Negative.      Objective:  BP (!) 158/64 (BP Location: Right Arm, Patient Position: Sitting)   Pulse 60   Temp 98.1 F (36.7 C) (Oral)   Resp 16   Ht '5\' 4"'$  (1.626 m)   Wt 128 lb 9.6 oz (58.3 kg)   SpO2 96%   BMI 22.07 kg/m   BP Readings from Last 3 Encounters:  10/21/21 (!) 158/64  04/05/21 140/70  03/01/21 (!) 162/76    Wt Readings from Last 3 Encounters:  10/21/21 128 lb 9.6 oz (58.3 kg)  04/05/21 132 lb (59.9 kg)  03/01/21 134 lb 12.8 oz (61.1 kg)    Physical Exam Vitals reviewed.  HENT:     Nose: Nose normal.     Mouth/Throat:     Mouth: Mucous membranes are moist.  Eyes:     General: No scleral icterus.    Conjunctiva/sclera: Conjunctivae normal.  Cardiovascular:     Rate and Rhythm: Regular rhythm. Bradycardia present.     Heart sounds: Normal heart sounds, S1 normal and S2 normal. No murmur heard.    No gallop.     Comments: EKG- SB, 57 bpm Minimal LVH No Q waves or ST/T wave changes Pulmonary:     Effort: Pulmonary effort is normal.     Breath sounds: No stridor. No wheezing, rhonchi or rales.  Abdominal:     Palpations: There is no mass.     Tenderness: There is no abdominal tenderness. There is no guarding.  Hernia: No hernia is present.  Musculoskeletal:     Cervical back: Neck supple.     Right lower leg: No edema.     Left lower leg: No edema.  Lymphadenopathy:     Cervical: No cervical adenopathy.  Skin:    General: Skin is warm and dry.  Neurological:     General: No focal deficit present.     Mental Status: She is alert. Mental status is at baseline.  Psychiatric:        Mood and Affect: Mood normal.        Behavior: Behavior normal.     Lab Results  Component Value Date   WBC 8.1 10/21/2021   HGB 12.6 10/21/2021   HCT 36.9 10/21/2021   PLT 165.0 10/21/2021   GLUCOSE 80 10/21/2021   CHOL 140 10/21/2021   TRIG 77.0 10/21/2021   HDL 65.80 10/21/2021   LDLCALC 59 10/21/2021   ALT 17 10/21/2021    AST 24 10/21/2021   NA 137 10/21/2021   K 4.0 10/21/2021   CL 102 10/21/2021   CREATININE 1.16 10/21/2021   BUN 14 10/21/2021   CO2 29 10/21/2021   TSH 2.98 10/21/2021    DG Bone Density  Result Date: 05/09/2017 Date of study: 05/09/2017 Exam: DUAL X-RAY ABSORPTIOMETRY (DXA) FOR BONE MINERAL DENSITY (BMD) Instrument: [employer] Provider: PCP Indication: follow up for low BMD Comparison: none (please note that it is not possible to compare data from different instruments) Clinical data: Pt is a 76 y.o. female without previous history of fracture. On calcium and vitamin D. Results:  Lumbar spine L1-L4 Femoral neck (FN) T-score 0.0 RFN: -1.1 LFN: -1.2 Assessment: the BMD is low according to the [facility] classification for osteoporosis (see below). Fracture risk: moderate FRAX score: 10 year major osteoporotic risk: 9.5 %. 10 year hip fracture risk: 1.2 %. The thresholds for treatment are 20% and 3%, respectively. Comments: the technical quality of the study is good. Evaluation for secondary causes should be considered if clinically indicated. Recommend optimizing calcium (1200 mg/day) and vitamin D (800 IU/day) intake. Followup: Repeat BMD is appropriate after 2 years. WHO criteria for diagnosis of osteoporosis in postmenopausal women and in men 93 y/o or older: - normal: T-score -1.0 to + 1.0 - osteopenia/low bone density: T-score between -2.5 and -1.0 - osteoporosis: T-score below -2.5 - severe osteoporosis: T-score below -2.5 with history of fragility fracture Note: although not part of the WHO classification, the presence of a fragility fracture, regardless of the T-score, should be considered diagnostic of osteoporosis, provided other causes for the fracture have been excluded. Treatment: The National Osteoporosis Foundation recommends that treatment be considered in postmenopausal women and men age 40 or older with: 1. Hip or vertebral (clinical or morphometric) fracture 2. T-score of -  2.5 or lower at the spine or hip 3. 10-year fracture probability by FRAX of at least 20% for a major osteoporotic fracture and 3% for a hip fracture Philemon Kingdom, MD [facility]   US Renal  Result Date: 10/29/2021 CLINICAL DATA:  CKD. EXAM: RENAL / URINARY TRACT ULTRASOUND COMPLETE COMPARISON:  None Available. FINDINGS: Right Kidney: Renal measurements: 10.2 x 3.3 x 3.8 cm = volume: 66 mL. Incompletely visualized due to overlying bowel gas. Echogenicity within normal limits. No mass or hydronephrosis visualized. Left Kidney: Renal measurements: 9.8 x 4.7 x 4.4 cm = volume: 106 mL. Poorly seen due to overlying bowel gas. Echogenicity within normal limits where seen. LEFT renal cyst  measures 1.3 cm. No suspicious mass or hydronephrosis visualized. Bladder: Appears normal for degree of bladder distention. Other: None. IMPRESSION: 1. No acute findings.  No hydronephrosis. 2. Portions of each kidney are obscured by overlying bowel gas, LEFT worse than RIGHT. Electronically Signed   By: Franki Cabot M.D.   On: 10/29/2021 16:18     Assessment & Plan:   [name] was seen today for annual exam and hypertension.  Diagnoses and all orders for this visit:  Essential hypertension-her blood pressure is not well controlled and she has LVH.  Will evaluate for secondary causes and endorgan damage.  We will increase the dose of amlodipine. -     Basic metabolic panel; Future -     CBC with Differential/Platelet; Future -     TSH; Future -     Aldosterone + renin activity w/ ratio; Future -     Aldosterone + renin activity w/ ratio -     TSH -     CBC with Differential/Platelet -     Basic metabolic panel -     US Renal; Future -     amLODipine (NORVASC) 10 MG tablet; Take 1 tablet (10 mg total) by mouth daily.  Osteopenia of multiple sites -     VITAMIN D 25 Hydroxy (Vit-D Deficiency, Fractures); Future -     VITAMIN D 25 Hydroxy (Vit-D Deficiency, Fractures) -     DG Bone Density;  Future  Stage 3b chronic kidney disease (HCC)-I recommended a renal ultrasound to evaluate for obstructive uropathy. -     Basic metabolic panel; Future -     CBC with Differential/Platelet; Future -     Urinalysis, Routine w reflex microscopic; Future -     Urinalysis, Routine w reflex microscopic -     CBC with Differential/Platelet -     Basic metabolic panel -     US Renal; Future -     amLODipine (NORVASC) 10 MG tablet; Take 1 tablet (10 mg total) by mouth daily.  Vitamin D deficiency disease -     VITAMIN D 25 Hydroxy (Vit-D Deficiency, Fractures); Future -     VITAMIN D 25 Hydroxy (Vit-D Deficiency, Fractures) -     DG Bone Density; Future  Hyperlipidemia LDL goal <70-LDL goal achieved. Doing well on the statin  -     Lipid panel; Future -     TSH; Future -     Hepatic function panel; Future -     Hepatic function panel -     TSH -     Lipid panel  Nonspecific abnormal electrocardiogram (ECG) (EKG)  Routine general medical examination at a health care facility- Exam completed, labs reviewed, vaccines reviewed and updated, cancer screenings are up-to-date, patient education was given.  LVH (left ventricular hypertrophy) due to hypertensive disease, without heart failure -     Aldosterone + renin activity w/ ratio; Future -     Aldosterone + renin activity w/ ratio -     amLODipine (NORVASC) 10 MG tablet; Take 1 tablet (10 mg total) by mouth daily.  Hypertension, unspecified type -     EKG 12-Lead  Need for vaccination -     Zoster Recombinant (Shingrix ) -     Pneumococcal polysaccharide vaccine 23-valent greater than or equal to 2yo subcutaneous/IM  Need for prophylactic vaccination with combined diphtheria-tetanus-pertussis (DTP) vaccine -     Tdap (BOOSTRIX) 5-2.5-18.5 LF-MCG/0.5 injection; Inject 0.5 mLs into the muscle once for 1 dose.  I have discontinued Azora C. Engelstad's amLODipine. I am also having her start on amLODipine and Boostrix. Additionally, I am  having her maintain her nitroGLYCERIN, irbesartan, aspirin EC, rosuvastatin, and cholecalciferol.  Meds ordered this encounter  Medications   amLODipine (NORVASC) 10 MG tablet    Sig: Take 1 tablet (10 mg total) by mouth daily.    Dispense:  90 tablet    Refill:  1   Tdap (BOOSTRIX) 5-2.5-18.5 LF-MCG/0.5 injection    Sig: Inject 0.5 mLs into the muscle once for 1 dose.    Dispense:  0.5 mL    Refill:  0     Follow-up: Return in about 6 months (around 04/23/2022).  Scarlette Calico, MD

## 2021-10-21 NOTE — Patient Instructions (Signed)

## 2021-10-22 ENCOUNTER — Other Ambulatory Visit (HOSPITAL_COMMUNITY): Payer: Self-pay

## 2021-10-25 ENCOUNTER — Other Ambulatory Visit (HOSPITAL_COMMUNITY): Payer: Self-pay

## 2021-10-25 MED ORDER — BOOSTRIX 5-2.5-18.5 LF-MCG/0.5 IM SUSY
0.5000 mL | PREFILLED_SYRINGE | Freq: Once | INTRAMUSCULAR | 0 refills | Status: AC
  Filled 2021-10-25: qty 0.5, 1d supply, fill #0

## 2021-10-26 LAB — ALDOSTERONE + RENIN ACTIVITY W/ RATIO
Aldosterone: <1 ng/dL
Renin Activity: 4.39 ng/mL/h (ref 0.25–5.82)

## 2021-10-28 ENCOUNTER — Ambulatory Visit
Admission: RE | Admit: 2021-10-28 | Discharge: 2021-10-28 | Disposition: A | Discharge status: Home / Self Care | Payer: 59 | Source: Ambulatory Visit | Attending: Internal Medicine | Admitting: Internal Medicine

## 2021-10-28 ENCOUNTER — Other Ambulatory Visit: Payer: Self-pay | Admitting: Internal Medicine

## 2021-10-28 ENCOUNTER — Other Ambulatory Visit (HOSPITAL_COMMUNITY): Payer: Self-pay

## 2021-10-28 DIAGNOSIS — I10 Essential (primary) hypertension: Secondary | ICD-10-CM

## 2021-10-28 DIAGNOSIS — I2510 Atherosclerotic heart disease of native coronary artery without angina pectoris: Secondary | ICD-10-CM

## 2021-10-28 DIAGNOSIS — N1832 Chronic kidney disease, stage 3b: Secondary | ICD-10-CM

## 2021-10-28 DIAGNOSIS — N189 Chronic kidney disease, unspecified: Secondary | ICD-10-CM | POA: Diagnosis not present

## 2021-10-28 DIAGNOSIS — N281 Cyst of kidney, acquired: Secondary | ICD-10-CM | POA: Diagnosis not present

## 2021-10-28 MED ORDER — IRBESARTAN 150 MG PO TABS
ORAL_TABLET | Freq: Every day | ORAL | 1 refills | Status: DC
  Filled 2021-10-28: qty 90, 90d supply, fill #0
  Filled 2022-03-14: qty 90, 90d supply, fill #1

## 2021-10-29 ENCOUNTER — Other Ambulatory Visit (HOSPITAL_COMMUNITY): Payer: Self-pay

## 2021-11-01 ENCOUNTER — Other Ambulatory Visit (HOSPITAL_COMMUNITY): Payer: Self-pay

## 2021-11-29 ENCOUNTER — Other Ambulatory Visit: Payer: Self-pay | Admitting: Internal Medicine

## 2021-11-30 ENCOUNTER — Other Ambulatory Visit (HOSPITAL_COMMUNITY): Payer: Self-pay

## 2021-11-30 MED ORDER — ROSUVASTATIN CALCIUM 40 MG PO TABS
40.0000 mg | ORAL_TABLET | Freq: Every day | ORAL | 0 refills | Status: DC
  Filled 2021-11-30 – 2021-12-15 (×2): qty 30, 30d supply, fill #0

## 2021-12-15 ENCOUNTER — Other Ambulatory Visit: Payer: Self-pay | Admitting: Internal Medicine

## 2021-12-15 DIAGNOSIS — I2510 Atherosclerotic heart disease of native coronary artery without angina pectoris: Secondary | ICD-10-CM

## 2021-12-15 MED ORDER — ASPIRIN 81 MG PO TBEC
DELAYED_RELEASE_TABLET | ORAL | 1 refills | Status: DC
  Filled 2021-12-15: qty 90, 90d supply, fill #0
  Filled 2022-04-13: qty 90, 90d supply, fill #1

## 2021-12-16 ENCOUNTER — Other Ambulatory Visit (HOSPITAL_COMMUNITY): Payer: Self-pay

## 2021-12-29 NOTE — Progress Notes (Unsigned)
Cardiology Office Note   Date:  12/30/2021   ID:  Zaryia, Markel 07/02/1945, MRN [id number]  PCP:  [name], MD  Cardiologist:   Dorris Carnes, MD   F/U of CAD     History of Present Illness: [name] is a 76 y.o. female with a history ofCAD s/p CABG in 2005 (LIMA to LAD; SVG to PDA; SVG to Diag; normal myovuiew in 2006 and 2022), hypertension, hyperlipidemia and prior tobacco abuse (25 pack year hx, quit in 1990s)  I saw the pt in May 2022  Since seen she says she says she feels good   Denies CP  NO SOB  Walks about 20 min per day    No  change in pace    Current Meds  Medication Sig   amLODipine (NORVASC) 10 MG tablet Take 1 tablet (10 mg total) by mouth daily.   aspirin EC 81 MG tablet TAKE 1 TABLET BY MOUTH ONCE DAILY (SWALLOW WHOLE)   cholecalciferol (VITAMIN D3) 25 MCG (1000 UNIT) tablet Take 1,000 Units by mouth daily.   irbesartan (AVAPRO) 150 MG tablet TAKE 1 TABLET BY MOUTH DAILY.   [DISCONTINUED] nitroGLYCERIN (NITROSTAT) 0.4 MG SL tablet PLACE 1 TABLET (0.4 MG TOTAL) UNDER THE TONGUE EVERY 5 (FIVE) MINUTES AS NEEDED FOR CHEST PAIN (X3 DOSES BEFORE CALLING 911).   [DISCONTINUED] rosuvastatin (CRESTOR) 40 MG tablet Take 1 tablet (40 mg total) by mouth daily.  Patient must keep 12/2021 appointment before any more refills.  Thank you.     Allergies:   Patient has no known allergies.   Past Medical History:  Diagnosis Date   CAD (coronary artery disease)    Cervical cancer (HCC)    Dr Ulanda Edison   Hyperlipidemia    Hypertension    Tobacco abuse     Past Surgical History:  Procedure Laterality Date   CORONARY ARTERY BYPASS GRAFT       Social History:  The patient  reports that she quit smoking about 36 years ago. Her smoking use included cigarettes. She has never used smokeless tobacco. She reports that she does not drink alcohol and does not use drugs.   Family History:  The patient's family history includes Coronary artery disease in an other  family member; Diabetes in an other family member; Heart attack in her father; Hypertension in her mother and another family member.    ROS:  Please see the history of present illness. All other systems are reviewed and  Negative to the above problem except as noted.    PHYSICAL EXAM: VS:  BP 130/60   Pulse 65   Ht '5\' 2"'$  (1.575 m)   Wt 125 lb 3.2 oz (56.8 kg)   SpO2 98%   BMI 22.90 kg/m   GEN: Well nourished, well developed, in no acute distress  HEENT: normal  Neck: no JVD, carotid bruits Cardiac: RRR; no murmurs..  No  LE edema  Respiratory:  clear to auscultation bilaterally GI: soft, nontender, nondistended, + BS  No hepatomegaly  MS: no deformity Moving all extremities   Skin: warm and dry, no rash Neuro:  Strength and sensation are intact Psych: euthymic mood, full affect   EKG:  EKG is not ordered today.   Lipid Panel    Component Value Date/Time   CHOL 140 10/21/2021 1552   CHOL 149 11/12/2020 0754   TRIG 77.0 10/21/2021 1552   HDL 65.80 10/21/2021 1552   HDL 73 11/12/2020 0754   CHOLHDL  2 10/21/2021 1552   VLDL 15.4 10/21/2021 1552   LDLCALC 59 10/21/2021 1552   LDLCALC 61 11/12/2020 0754      Wt Readings from Last 3 Encounters:  12/30/21 125 lb 3.2 oz (56.8 kg)  10/21/21 128 lb 9.6 oz (58.3 kg)  04/05/21 132 lb (59.9 kg)      ASSESSMENT AND PLAN:  1  CAD  Pt with remote CABG    2005 .    Myoview in 2022 was normal  Pt without symptoms       2  HTN   BP is er;;  controlled   Keep on same meds  3  HL   LDL is 59  HDL 66   Continue Crestor     Otherwise plan to f/u end of next sumer    Current medicines are reviewed at length with the patient today.  The patient does not have concerns regarding medicines.  Signed, Dorris Carnes, MD  12/30/2021 9:22 PM    [facility] [address], [place], [place]  [address] Phone: [phone]; Fax: [phone]

## 2021-12-30 ENCOUNTER — Encounter: Payer: Self-pay | Admitting: Internal Medicine

## 2021-12-30 ENCOUNTER — Other Ambulatory Visit (HOSPITAL_COMMUNITY): Payer: Self-pay

## 2021-12-30 ENCOUNTER — Ambulatory Visit: Payer: 59 | Attending: Internal Medicine | Admitting: Internal Medicine

## 2021-12-30 DIAGNOSIS — I2510 Atherosclerotic heart disease of native coronary artery without angina pectoris: Secondary | ICD-10-CM

## 2021-12-30 MED ORDER — ROSUVASTATIN CALCIUM 40 MG PO TABS
40.0000 mg | ORAL_TABLET | Freq: Every day | ORAL | 3 refills | Status: DC
  Filled 2021-12-30 – 2022-01-20 (×2): qty 90, 90d supply, fill #0
  Filled 2022-06-16: qty 90, 90d supply, fill #1
  Filled 2022-09-13: qty 90, 90d supply, fill #2
  Filled 2022-12-12: qty 90, 90d supply, fill #3

## 2021-12-30 MED ORDER — NITROGLYCERIN 0.4 MG SL SUBL
0.4000 mg | SUBLINGUAL_TABLET | SUBLINGUAL | 6 refills | Status: DC | PRN
  Filled 2021-12-30: qty 25, 1d supply, fill #0

## 2021-12-30 NOTE — Patient Instructions (Signed)
Medication Instructions:   *If you need a refill on your cardiac medications before your next appointment, please call your pharmacy*   Lab Work:  If you have labs (blood work) drawn today and your tests are completely normal, you will receive your results only by: MyChart Message (if you have MyChart) OR A paper copy in the mail If you have any lab test that is abnormal or we need to change your treatment, we will call you to review the results.   Testing/Procedures:    Follow-Up: At [facility], you and your health needs are our priority.  As part of our continuing mission to provide you with exceptional heart care, we have created designated Provider Care Teams.  These Care Teams include your primary Cardiologist (physician) and Advanced Practice Providers (APPs -  Physician Assistants and Nurse Practitioners) who all work together to provide you with the care you need, when you need it.  We recommend signing up for the patient portal called "MyChart".  Sign up information is provided on this After Visit Summary.  MyChart is used to connect with patients for Virtual Visits (Telemedicine).  Patients are able to view lab/test results, encounter notes, upcoming appointments, etc.  Non-urgent messages can be sent to your provider as well.   To learn more about what you can do with MyChart, go to [email or web address].    Your next appointment:   10 month(s)  The format for your next appointment:   In Person  Provider:   Paula Ross, MD     Other Instructions   Important Information About Sugar       

## 2021-12-31 ENCOUNTER — Other Ambulatory Visit (HOSPITAL_COMMUNITY): Payer: Self-pay

## 2022-01-11 ENCOUNTER — Other Ambulatory Visit (HOSPITAL_COMMUNITY): Payer: Self-pay

## 2022-01-20 ENCOUNTER — Other Ambulatory Visit (HOSPITAL_COMMUNITY): Payer: Self-pay

## 2022-03-16 DIAGNOSIS — Z01419 Encounter for gynecological examination (general) (routine) without abnormal findings: Secondary | ICD-10-CM | POA: Diagnosis not present

## 2022-03-16 DIAGNOSIS — Z1231 Encounter for screening mammogram for malignant neoplasm of breast: Secondary | ICD-10-CM | POA: Diagnosis not present

## 2022-04-13 ENCOUNTER — Other Ambulatory Visit: Payer: Self-pay

## 2022-04-13 ENCOUNTER — Other Ambulatory Visit: Payer: Self-pay | Admitting: Internal Medicine

## 2022-04-13 DIAGNOSIS — N1832 Chronic kidney disease, stage 3b: Secondary | ICD-10-CM

## 2022-04-13 DIAGNOSIS — I119 Hypertensive heart disease without heart failure: Secondary | ICD-10-CM

## 2022-04-13 DIAGNOSIS — I10 Essential (primary) hypertension: Secondary | ICD-10-CM

## 2022-04-13 MED ORDER — AMLODIPINE BESYLATE 10 MG PO TABS
10.0000 mg | ORAL_TABLET | Freq: Every day | ORAL | 0 refills | Status: DC
  Filled 2022-04-13: qty 90, 90d supply, fill #0

## 2022-04-14 ENCOUNTER — Other Ambulatory Visit (HOSPITAL_COMMUNITY): Payer: Self-pay

## 2022-04-25 ENCOUNTER — Ambulatory Visit: Payer: Commercial Managed Care - PPO | Admitting: Internal Medicine

## 2022-04-25 ENCOUNTER — Encounter: Payer: Self-pay | Admitting: Internal Medicine

## 2022-04-25 VITALS — BP 136/60 | HR 66 | Temp 98.2°F | Ht 62.0 in | Wt 128.0 lb

## 2022-04-25 DIAGNOSIS — I10 Essential (primary) hypertension: Secondary | ICD-10-CM

## 2022-04-25 DIAGNOSIS — R011 Cardiac murmur, unspecified: Secondary | ICD-10-CM

## 2022-04-25 DIAGNOSIS — Z23 Encounter for immunization: Secondary | ICD-10-CM

## 2022-04-25 DIAGNOSIS — R0989 Other specified symptoms and signs involving the circulatory and respiratory systems: Secondary | ICD-10-CM | POA: Insufficient documentation

## 2022-04-25 DIAGNOSIS — N1832 Chronic kidney disease, stage 3b: Secondary | ICD-10-CM | POA: Diagnosis not present

## 2022-04-25 LAB — CBC WITH DIFFERENTIAL/PLATELET
Basophils Absolute: 0.1 10*3/uL (ref 0.0–0.1)
Basophils Relative: 1.3 % (ref 0.0–3.0)
Eosinophils Absolute: 0.6 10*3/uL (ref 0.0–0.7)
Eosinophils Relative: 6.4 % — ABNORMAL HIGH (ref 0.0–5.0)
HCT: 35.9 % — ABNORMAL LOW (ref 36.0–46.0)
Hemoglobin: 12.5 g/dL (ref 12.0–15.0)
Lymphocytes Relative: 23.3 % (ref 12.0–46.0)
Lymphs Abs: 2.1 10*3/uL (ref 0.7–4.0)
MCHC: 34.7 g/dL (ref 30.0–36.0)
MCV: 92.8 fl (ref 78.0–100.0)
Monocytes Absolute: 0.5 10*3/uL (ref 0.1–1.0)
Monocytes Relative: 5.2 % (ref 3.0–12.0)
Neutro Abs: 5.7 10*3/uL (ref 1.4–7.7)
Neutrophils Relative %: 63.8 % (ref 43.0–77.0)
Platelets: 180 10*3/uL (ref 150.0–400.0)
RBC: 3.87 Mil/uL (ref 3.87–5.11)
RDW: 12.8 % (ref 11.5–15.5)
WBC: 8.9 10*3/uL (ref 4.0–10.5)

## 2022-04-25 NOTE — Patient Instructions (Signed)
Hypertension, Adult High blood pressure (hypertension) is when the force of blood pumping through the arteries is too strong. The arteries are the blood vessels that carry blood from the heart throughout the body. Hypertension forces the heart to work harder to pump blood and may cause arteries to become narrow or stiff. Untreated or uncontrolled hypertension can lead to a heart attack, heart failure, a stroke, kidney disease, and other problems. A blood pressure reading consists of a higher number over a lower number. Ideally, your blood pressure should be below 120/80. The first ("top") number is called the systolic pressure. It is a measure of the pressure in your arteries as your heart beats. The second ("bottom") number is called the diastolic pressure. It is a measure of the pressure in your arteries as the heart relaxes. What are the causes? The exact cause of this condition is not known. There are some conditions that result in high blood pressure. What increases the risk? Certain factors may make you more likely to develop high blood pressure. Some of these risk factors are under your control, including: Smoking. Not getting enough exercise or physical activity. Being overweight. Having too much fat, sugar, calories, or salt (sodium) in your diet. Drinking too much alcohol. Other risk factors include: Having a personal history of heart disease, diabetes, high cholesterol, or kidney disease. Stress. Having a family history of high blood pressure and high cholesterol. Having obstructive sleep apnea. Age. The risk increases with age. What are the signs or symptoms? High blood pressure may not cause symptoms. Very high blood pressure (hypertensive crisis) may cause: Headache. Fast or irregular heartbeats (palpitations). Shortness of breath. Nosebleed. Nausea and vomiting. Vision changes. Severe chest pain, dizziness, and seizures. How is this diagnosed? This condition is diagnosed by  measuring your blood pressure while you are seated, with your arm resting on a flat surface, your legs uncrossed, and your feet flat on the floor. The cuff of the blood pressure monitor will be placed directly against the skin of your upper arm at the level of your heart. Blood pressure should be measured at least twice using the same arm. Certain conditions can cause a difference in blood pressure between your right and left arms. If you have a high blood pressure reading during one visit or you have normal blood pressure with other risk factors, you may be asked to: Return on a different day to have your blood pressure checked again. Monitor your blood pressure at home for 1 week or longer. If you are diagnosed with hypertension, you may have other blood or imaging tests to help your health care provider understand your overall risk for other conditions. How is this treated? This condition is treated by making healthy lifestyle changes, such as eating healthy foods, exercising more, and reducing your alcohol intake. You may be referred for counseling on a healthy diet and physical activity. Your health care provider may prescribe medicine if lifestyle changes are not enough to get your blood pressure under control and if: Your systolic blood pressure is above 130. Your diastolic blood pressure is above 80. Your personal target blood pressure may vary depending on your medical conditions, your age, and other factors. Follow these instructions at home: Eating and drinking  Eat a diet that is high in fiber and potassium, and low in sodium, added sugar, and fat. An example of this eating plan is called the DASH diet. DASH stands for Dietary Approaches to Stop Hypertension. To eat this way: Eat   plenty of fresh fruits and vegetables. Try to fill one half of your plate at each meal with fruits and vegetables. Eat whole grains, such as whole-wheat pasta, brown rice, or whole-grain bread. Fill about one  fourth of your plate with whole grains. Eat or drink low-fat dairy products, such as skim milk or low-fat yogurt. Avoid fatty cuts of meat, processed or cured meats, and poultry with skin. Fill about one fourth of your plate with lean proteins, such as fish, chicken without skin, beans, eggs, or tofu. Avoid pre-made and processed foods. These tend to be higher in sodium, added sugar, and fat. Reduce your daily sodium intake. Many people with hypertension should eat less than 1,500 mg of sodium a day. Do not drink alcohol if: Your health care provider tells you not to drink. You are pregnant, may be pregnant, or are planning to become pregnant. If you drink alcohol: Limit how much you have to: 0-1 drink a day for women. 0-2 drinks a day for men. Know how much alcohol is in your drink. In the [place] one drink equals one 12 oz bottle of beer (355 mL), one 5 oz glass of wine (148 mL), or one 1 oz glass of hard liquor (44 mL). Lifestyle  Work with your health care provider to maintain a healthy body weight or to lose weight. Ask what an ideal weight is for you. Get at least 30 minutes of exercise that causes your heart to beat faster (aerobic exercise) most days of the week. Activities may include walking, swimming, or biking. Include exercise to strengthen your muscles (resistance exercise), such as Pilates or lifting weights, as part of your weekly exercise routine. Try to do these types of exercises for 30 minutes at least 3 days a week. Do not use any products that contain nicotine or tobacco. These products include cigarettes, chewing tobacco, and vaping devices, such as e-cigarettes. If you need help quitting, ask your health care provider. Monitor your blood pressure at home as told by your health care provider. Keep all follow-up visits. This is important. Medicines Take over-the-counter and prescription medicines only as told by your health care provider. Follow directions carefully. Blood  pressure medicines must be taken as prescribed. Do not skip doses of blood pressure medicine. Doing this puts you at risk for problems and can make the medicine less effective. Ask your health care provider about side effects or reactions to medicines that you should watch for. Contact a health care provider if you: Think you are having a reaction to a medicine you are taking. Have headaches that keep coming back (recurring). Feel dizzy. Have swelling in your ankles. Have trouble with your vision. Get help right away if you: Develop a severe headache or confusion. Have unusual weakness or numbness. Feel faint. Have severe pain in your chest or abdomen. Vomit repeatedly. Have trouble breathing. These symptoms may be an emergency. Get help right away. Call 911. Do not wait to see if the symptoms will go away. Do not drive yourself to the hospital. Summary Hypertension is when the force of blood pumping through your arteries is too strong. If this condition is not controlled, it may put you at risk for serious complications. Your personal target blood pressure may vary depending on your medical conditions, your age, and other factors. For most people, a normal blood pressure is less than 120/80. Hypertension is treated with lifestyle changes, medicines, or a combination of both. Lifestyle changes include losing weight, eating a healthy,   low-sodium diet, exercising more, and limiting alcohol. This information is not intended to replace advice given to you by your health care provider. Make sure you discuss any questions you have with your health care provider. Document Revised: 01/05/2021 Document Reviewed: 01/05/2021 Elsevier Patient Education  2023 [employer]  

## 2022-04-25 NOTE — Progress Notes (Unsigned)
Subjective:  Patient ID: [name], female    DOB: 1945-08-15  Age: 77 y.o. MRN: [id number]  CC: No chief complaint on file.   HPI [name] presents for ***  Outpatient Medications Prior to Visit  Medication Sig Dispense Refill   amLODipine (NORVASC) 10 MG tablet Take 1 tablet (10 mg total) by mouth daily. 90 tablet 0   aspirin EC 81 MG tablet TAKE 1 TABLET BY MOUTH ONCE DAILY (SWALLOW WHOLE) 90 tablet 1   cholecalciferol (VITAMIN D3) 25 MCG (1000 UNIT) tablet Take 1,000 Units by mouth daily.     irbesartan (AVAPRO) 150 MG tablet TAKE 1 TABLET BY MOUTH DAILY. 90 tablet 1   nitroGLYCERIN (NITROSTAT) 0.4 MG SL tablet Place 1 tablet under the tongue every 5 minutes as needed for chest pain (3 DOSES then CALL 911). 25 tablet 6   rosuvastatin (CRESTOR) 40 MG tablet Take 1 tablet (40 mg total) by mouth daily. 90 tablet 3   No facility-administered medications prior to visit.    ROS Review of Systems  Objective:  BP 136/60 (BP Location: Right Arm, Patient Position: Sitting, Cuff Size: Large)   Pulse 66   Temp 98.2 F (36.8 C) (Oral)   Ht 5' 2"$  (1.575 m)   Wt 128 lb (58.1 kg)   SpO2 97%   BMI 23.41 kg/m   BP Readings from Last 3 Encounters:  04/25/22 136/60  12/30/21 130/60  10/21/21 (!) 158/64    Wt Readings from Last 3 Encounters:  04/25/22 128 lb (58.1 kg)  12/30/21 125 lb 3.2 oz (56.8 kg)  10/21/21 128 lb 9.6 oz (58.3 kg)    Physical Exam  Lab Results  Component Value Date   WBC 8.1 10/21/2021   HGB 12.6 10/21/2021   HCT 36.9 10/21/2021   PLT 165.0 10/21/2021   GLUCOSE 80 10/21/2021   CHOL 140 10/21/2021   TRIG 77.0 10/21/2021   HDL 65.80 10/21/2021   LDLCALC 59 10/21/2021   ALT 17 10/21/2021   AST 24 10/21/2021   NA 137 10/21/2021   K 4.0 10/21/2021   CL 102 10/21/2021   CREATININE 1.16 10/21/2021   BUN 14 10/21/2021   CO2 29 10/21/2021   TSH 2.98 10/21/2021    US Renal  Result Date: 10/29/2021 CLINICAL DATA:  CKD. EXAM: RENAL /  URINARY TRACT ULTRASOUND COMPLETE COMPARISON:  None Available. FINDINGS: Right Kidney: Renal measurements: 10.2 x 3.3 x 3.8 cm = volume: 66 mL. Incompletely visualized due to overlying bowel gas. Echogenicity within normal limits. No mass or hydronephrosis visualized. Left Kidney: Renal measurements: 9.8 x 4.7 x 4.4 cm = volume: 106 mL. Poorly seen due to overlying bowel gas. Echogenicity within normal limits where seen. LEFT renal cyst measures 1.3 cm. No suspicious mass or hydronephrosis visualized. Bladder: Appears normal for degree of bladder distention. Other: None. IMPRESSION: 1. No acute findings.  No hydronephrosis. 2. Portions of each kidney are obscured by overlying bowel gas, LEFT worse than RIGHT. Electronically Signed   By: Franki Cabot M.D.   On: 10/29/2021 16:18    Assessment & Plan:   Diagnoses and all orders for this visit:  Apical systolic murmur -     ECHOCARDIOGRAM COMPLETE; Future  Widened pulse pressure -     ECHOCARDIOGRAM COMPLETE; Future   I am having Breck C. Koren maintain her cholecalciferol, irbesartan, aspirin EC, rosuvastatin, nitroGLYCERIN, and amLODipine.  No orders of the defined types were placed in this encounter.    Follow-up: No  follow-ups on file.  Scarlette Calico, MD

## 2022-04-26 LAB — BASIC METABOLIC PANEL
BUN: 16 mg/dL (ref 6–23)
CO2: 26 mEq/L (ref 19–32)
Calcium: 9.6 mg/dL (ref 8.4–10.5)
Chloride: 103 mEq/L (ref 96–112)
Creatinine, Ser: 1.14 mg/dL (ref 0.40–1.20)
GFR: 46.72 mL/min — ABNORMAL LOW (ref >60.00)
Glucose, Bld: 78 mg/dL (ref 70–99)
Potassium: 3.9 mEq/L (ref 3.5–5.1)
Sodium: 139 mEq/L (ref 135–145)

## 2022-04-26 LAB — URINALYSIS, ROUTINE W REFLEX MICROSCOPIC
Bilirubin Urine: NEGATIVE
Hgb urine dipstick: NEGATIVE
Ketones, ur: NEGATIVE
Nitrite: NEGATIVE
RBC / HPF: NONE SEEN (ref 0–?)
Specific Gravity, Urine: 1.01 (ref 1.000–1.030)
Total Protein, Urine: NEGATIVE
Urine Glucose: NEGATIVE
Urobilinogen, UA: 0.2 (ref 0.0–1.0)
pH: 6 (ref 5.0–8.0)

## 2022-05-30 ENCOUNTER — Encounter (HOSPITAL_COMMUNITY): Payer: Self-pay | Admitting: Internal Medicine

## 2022-06-09 DIAGNOSIS — H524 Presbyopia: Secondary | ICD-10-CM | POA: Diagnosis not present

## 2022-06-09 DIAGNOSIS — Z961 Presence of intraocular lens: Secondary | ICD-10-CM | POA: Diagnosis not present

## 2022-06-16 ENCOUNTER — Other Ambulatory Visit: Payer: Self-pay | Admitting: Internal Medicine

## 2022-06-16 ENCOUNTER — Other Ambulatory Visit (HOSPITAL_COMMUNITY): Payer: Self-pay

## 2022-06-16 DIAGNOSIS — I2510 Atherosclerotic heart disease of native coronary artery without angina pectoris: Secondary | ICD-10-CM

## 2022-06-16 DIAGNOSIS — N1832 Chronic kidney disease, stage 3b: Secondary | ICD-10-CM

## 2022-06-16 DIAGNOSIS — I119 Hypertensive heart disease without heart failure: Secondary | ICD-10-CM

## 2022-06-16 DIAGNOSIS — I10 Essential (primary) hypertension: Secondary | ICD-10-CM

## 2022-06-16 MED ORDER — AMLODIPINE BESYLATE 10 MG PO TABS
10.0000 mg | ORAL_TABLET | Freq: Every day | ORAL | 1 refills | Status: DC
Start: 2022-06-16 — End: 2022-07-04
  Filled 2022-06-16: qty 90, 90d supply, fill #0

## 2022-06-16 MED ORDER — IRBESARTAN 150 MG PO TABS
150.0000 mg | ORAL_TABLET | Freq: Every day | ORAL | 1 refills | Status: DC
  Filled 2022-06-16: qty 90, 90d supply, fill #0
  Filled 2022-09-13: qty 90, 90d supply, fill #1

## 2022-06-17 ENCOUNTER — Other Ambulatory Visit (HOSPITAL_COMMUNITY): Payer: Self-pay

## 2022-06-18 ENCOUNTER — Other Ambulatory Visit (HOSPITAL_COMMUNITY): Payer: Self-pay

## 2022-06-20 ENCOUNTER — Other Ambulatory Visit (HOSPITAL_COMMUNITY): Payer: Self-pay

## 2022-07-04 ENCOUNTER — Emergency Department (HOSPITAL_BASED_OUTPATIENT_CLINIC_OR_DEPARTMENT_OTHER): Payer: Commercial Managed Care - PPO

## 2022-07-04 ENCOUNTER — Other Ambulatory Visit (HOSPITAL_COMMUNITY): Payer: Self-pay

## 2022-07-04 ENCOUNTER — Ambulatory Visit (HOSPITAL_COMMUNITY): Payer: Commercial Managed Care - PPO

## 2022-07-04 ENCOUNTER — Emergency Department (HOSPITAL_COMMUNITY)
Admission: EM | Admit: 2022-07-04 | Discharge: 2022-07-04 | Disposition: A | Discharge status: Home / Self Care | Payer: Commercial Managed Care - PPO | Attending: Emergency Medicine | Admitting: Emergency Medicine

## 2022-07-04 ENCOUNTER — Emergency Department (HOSPITAL_COMMUNITY): Payer: Commercial Managed Care - PPO

## 2022-07-04 ENCOUNTER — Other Ambulatory Visit: Payer: Self-pay

## 2022-07-04 DIAGNOSIS — R55 Syncope and collapse: Secondary | ICD-10-CM | POA: Insufficient documentation

## 2022-07-04 DIAGNOSIS — R944 Abnormal results of kidney function studies: Secondary | ICD-10-CM | POA: Diagnosis not present

## 2022-07-04 DIAGNOSIS — I959 Hypotension, unspecified: Secondary | ICD-10-CM | POA: Diagnosis not present

## 2022-07-04 DIAGNOSIS — R42 Dizziness and giddiness: Secondary | ICD-10-CM | POA: Diagnosis not present

## 2022-07-04 DIAGNOSIS — I10 Essential (primary) hypertension: Secondary | ICD-10-CM | POA: Insufficient documentation

## 2022-07-04 DIAGNOSIS — I2510 Atherosclerotic heart disease of native coronary artery without angina pectoris: Secondary | ICD-10-CM | POA: Diagnosis not present

## 2022-07-04 DIAGNOSIS — T679XXA Effect of heat and light, unspecified, initial encounter: Secondary | ICD-10-CM | POA: Diagnosis not present

## 2022-07-04 DIAGNOSIS — W182XXA Fall in (into) shower or empty bathtub, initial encounter: Secondary | ICD-10-CM | POA: Insufficient documentation

## 2022-07-04 DIAGNOSIS — Y92002 Bathroom of unspecified non-institutional (private) residence single-family (private) house as the place of occurrence of the external cause: Secondary | ICD-10-CM | POA: Insufficient documentation

## 2022-07-04 DIAGNOSIS — Y93E1 Activity, personal bathing and showering: Secondary | ICD-10-CM | POA: Diagnosis not present

## 2022-07-04 DIAGNOSIS — Z79899 Other long term (current) drug therapy: Secondary | ICD-10-CM | POA: Insufficient documentation

## 2022-07-04 DIAGNOSIS — Z87891 Personal history of nicotine dependence: Secondary | ICD-10-CM | POA: Diagnosis not present

## 2022-07-04 DIAGNOSIS — Z8541 Personal history of malignant neoplasm of cervix uteri: Secondary | ICD-10-CM | POA: Diagnosis not present

## 2022-07-04 DIAGNOSIS — Z7982 Long term (current) use of aspirin: Secondary | ICD-10-CM | POA: Diagnosis not present

## 2022-07-04 DIAGNOSIS — R001 Bradycardia, unspecified: Secondary | ICD-10-CM | POA: Diagnosis not present

## 2022-07-04 DIAGNOSIS — N1832 Chronic kidney disease, stage 3b: Secondary | ICD-10-CM

## 2022-07-04 DIAGNOSIS — Z951 Presence of aortocoronary bypass graft: Secondary | ICD-10-CM | POA: Insufficient documentation

## 2022-07-04 DIAGNOSIS — R011 Cardiac murmur, unspecified: Secondary | ICD-10-CM

## 2022-07-04 DIAGNOSIS — I119 Hypertensive heart disease without heart failure: Secondary | ICD-10-CM

## 2022-07-04 LAB — ECHOCARDIOGRAM COMPLETE
AR max vel: 2.3 cm2
AV Area VTI: 2.34 cm2
AV Area mean vel: 2.18 cm2
AV Mean grad: 4.5 mmHg
AV Peak grad: 8.8 mmHg
AV Vena cont: 0.6 cm
Ao pk vel: 1.48 m/s
Area-P 1/2: 4.68 cm2
Calc EF: 63.4 %
Height: 64 in
MV VTI: 2.41 cm2
P 1/2 time: 347 msec
S' Lateral: 2.8 cm
Single Plane A2C EF: 65.5 %
Single Plane A4C EF: 62.8 %
Weight: 2032 oz

## 2022-07-04 LAB — CBC
HCT: 33.1 % — ABNORMAL LOW (ref 36.0–46.0)
Hemoglobin: 11.7 g/dL — ABNORMAL LOW (ref 12.0–15.0)
MCH: 33.1 pg (ref 26.0–34.0)
MCHC: 35.3 g/dL (ref 30.0–36.0)
MCV: 93.5 fL (ref 80.0–100.0)
Platelets: 121 10*3/uL — ABNORMAL LOW (ref 150–400)
RBC: 3.54 MIL/uL — ABNORMAL LOW (ref 3.87–5.11)
RDW: 12.3 % (ref 11.5–15.5)
WBC: 7.9 10*3/uL (ref 4.0–10.5)
nRBC: 0 % (ref 0.0–0.2)

## 2022-07-04 LAB — URINALYSIS, ROUTINE W REFLEX MICROSCOPIC
Bilirubin Urine: NEGATIVE
Glucose, UA: NEGATIVE mg/dL
Hgb urine dipstick: NEGATIVE
Ketones, ur: NEGATIVE mg/dL
Leukocytes,Ua: NEGATIVE
Nitrite: NEGATIVE
Protein, ur: NEGATIVE mg/dL
Specific Gravity, Urine: 1.01 (ref 1.005–1.030)
pH: 5 (ref 5.0–8.0)

## 2022-07-04 LAB — TROPONIN I (HIGH SENSITIVITY)
Troponin I (High Sensitivity): 3 ng/L (ref <18)
Troponin I (High Sensitivity): 3 ng/L (ref <18)

## 2022-07-04 LAB — BASIC METABOLIC PANEL
Anion gap: 7 (ref 5–15)
BUN: 22 mg/dL (ref 8–23)
CO2: 21 mmol/L — ABNORMAL LOW (ref 22–32)
Calcium: 8.6 mg/dL — ABNORMAL LOW (ref 8.9–10.3)
Chloride: 111 mmol/L (ref 98–111)
Creatinine, Ser: 1.39 mg/dL — ABNORMAL HIGH (ref 0.44–1.00)
GFR, Estimated: 39 mL/min — ABNORMAL LOW (ref >60)
Glucose, Bld: 128 mg/dL — ABNORMAL HIGH (ref 70–99)
Potassium: 3.7 mmol/L (ref 3.5–5.1)
Sodium: 139 mmol/L (ref 135–145)

## 2022-07-04 LAB — CBG MONITORING, ED: Glucose-Capillary: 124 mg/dL — ABNORMAL HIGH (ref 70–99)

## 2022-07-04 MED ORDER — AMLODIPINE BESYLATE 10 MG PO TABS
5.0000 mg | ORAL_TABLET | Freq: Every day | ORAL | 0 refills | Status: AC
Start: 2022-07-04 — End: ?
  Filled 2022-07-04: qty 30, 60d supply, fill #0

## 2022-07-04 MED ORDER — SODIUM CHLORIDE 0.9 % IV SOLN: INTRAVENOUS | Status: DC

## 2022-07-04 MED ORDER — ONDANSETRON HCL 4 MG/2ML IJ SOLN: 4.0000 mg | Freq: Once | INTRAMUSCULAR | Status: DC

## 2022-07-04 MED ORDER — SODIUM CHLORIDE 0.9 % IV BOLUS
1000.0000 mL | Freq: Once | INTRAVENOUS | Status: AC
  Administered 2022-07-04: 1000 mL via INTRAVENOUS

## 2022-07-04 NOTE — ED Provider Notes (Signed)
[facility]PARTMENT AT [facility] Provider Note   CSN: [id number] Arrival date & time: 07/04/22  4098     History  Chief Complaint  Patient presents with   Loss of Consciousness    [name] is a 77 y.o. female.  Pt is a 77 yo female with pmhx significant for CAD s/p CABG, HTN, HLD, former tobacco abuse and cervical cancer.  Pt was in the shower today.  She felt dizzy and then had a syncopal event.  She did not hurt herself with the syncopal fall.  She denies cp.  She still feels a little dizzy.  EMS gave her zofran and 500 cc NS which has helped.  Pt was scheduled for an ECHO today b/c her PCP heard a murmur.         Home Medications Prior to Admission medications   Medication Sig Start Date End Date Taking? Authorizing Provider  amLODipine (NORVASC) 10 MG tablet Take 0.5 tablets (5 mg total) by mouth daily. 07/04/22   Jacalyn Lefevre, MD  aspirin EC 81 MG tablet TAKE 1 TABLET BY MOUTH ONCE DAILY (SWALLOW WHOLE) 12/15/21 12/15/22  Etta Grandchild, MD  cholecalciferol (VITAMIN D3) 25 MCG (1000 UNIT) tablet Take 1,000 Units by mouth daily.    [provider]  irbesartan (AVAPRO) 150 MG tablet Take 1 tablet (150 mg total) by mouth daily. 06/16/22   Etta Grandchild, MD  nitroGLYCERIN (NITROSTAT) 0.4 MG SL tablet Place 1 tablet under the tongue every 5 minutes as needed for chest pain (3 DOSES then CALL 911). 12/30/21   Pricilla Riffle, MD  rosuvastatin (CRESTOR) 40 MG tablet Take 1 tablet (40 mg total) by mouth daily. 12/30/21   Pricilla Riffle, MD      Allergies    Patient has no known allergies.    Review of Systems   Review of Systems  Neurological:  Positive for syncope.  All other systems reviewed and are negative.   Physical Exam Updated Vital Signs BP (!) 127/54   Pulse 64   Temp (!) 97.2 F (36.2 C) (Axillary)   Resp 15   Ht 5\' 4"  (1.626 m)   Wt 57.6 kg   SpO2 100%   BMI 21.80 kg/m  Physical Exam Vitals and nursing note  reviewed.  Constitutional:      Appearance: Normal appearance.  HENT:     Head: Normocephalic and atraumatic.     Right Ear: External ear normal.     Left Ear: External ear normal.     Nose: Nose normal.     Mouth/Throat:     Mouth: Mucous membranes are moist.     Pharynx: Oropharynx is clear.  Eyes:     Extraocular Movements: Extraocular movements intact.     Conjunctiva/sclera: Conjunctivae normal.     Pupils: Pupils are equal, round, and reactive to light.  Cardiovascular:     Rate and Rhythm: Normal rate and regular rhythm.     Pulses: Normal pulses.     Heart sounds: Normal heart sounds.  Pulmonary:     Effort: Pulmonary effort is normal.     Breath sounds: Normal breath sounds.  Abdominal:     General: Abdomen is flat. Bowel sounds are normal.     Palpations: Abdomen is soft.  Musculoskeletal:        General: Normal range of motion.     Cervical back: Normal range of motion and neck supple.  Skin:    General:  Skin is warm.     Capillary Refill: Capillary refill takes less than 2 seconds.  Neurological:     General: No focal deficit present.     Mental Status: She is alert and oriented to person, place, and time.  Psychiatric:        Mood and Affect: Mood normal.        Behavior: Behavior normal.     ED Results / Procedures / Treatments   Labs (all labs ordered are listed, but only abnormal results are displayed) Labs Reviewed  BASIC METABOLIC PANEL - Abnormal; Notable for the following components:      Result Value   CO2 21 (*)    Glucose, Bld 128 (*)    Creatinine, Ser 1.39 (*)    Calcium 8.6 (*)    GFR, Estimated 39 (*)    All other components within normal limits  CBC - Abnormal; Notable for the following components:   RBC 3.54 (*)    Hemoglobin 11.7 (*)    HCT 33.1 (*)    Platelets 121 (*)    All other components within normal limits  CBG MONITORING, ED - Abnormal; Notable for the following components:   Glucose-Capillary 124 (*)    All other  components within normal limits  URINALYSIS, ROUTINE W REFLEX MICROSCOPIC  TROPONIN I (HIGH SENSITIVITY)  TROPONIN I (HIGH SENSITIVITY)    EKG EKG Interpretation  Date/Time:  Monday July 04 2022 06:50:03 EDT Ventricular Rate:  57 PR Interval:  171 QRS Duration: 89 QT Interval:  456 QTC Calculation: 444 R Axis:   38 Text Interpretation: Sinus rhythm Minimal ST elevation, inferior leads No significant change since last tracing Confirmed by Jacalyn Lefevre [id number]) on 07/04/2022 7:36:58 AM  Radiology ECHOCARDIOGRAM COMPLETE  Result Date: 07/04/2022    ECHOCARDIOGRAM REPORT   Patient Name:   [name] Date of Exam: 07/04/2022 Medical Rec #:  [id number]       Height:       64.0 in Accession #:    [id number]      Weight:       127.0 lb Date of Birth:  1945-12-30       BSA:          1.613 m Patient Age:    76 years        BP:           103/88 mmHg Patient Gender: F               HR:           58 bpm. Exam Location:  Inpatient Procedure: 2D Echo, Cardiac Doppler and Color Doppler Indications:    Murmur  History:        Patient has no prior history of Echocardiogram examinations.                 Risk Factors:Dyslipidemia and Hypertension.  Sonographer:    Wallie Char Referring Phys: [phone] Raigen Jagielski IMPRESSIONS  1. Left ventricular ejection fraction, by estimation, is 60 to 65%. The left ventricle has normal function. The left ventricle has no regional wall motion abnormalities. There is mild concentric left ventricular hypertrophy. Left ventricular diastolic parameters are consistent with Grade I diastolic dysfunction (impaired relaxation).  2. Right ventricular systolic function is normal. The right ventricular size is normal. There is mildly elevated pulmonary artery systolic pressure. The estimated right ventricular systolic pressure is 34.4 mmHg.  3. The mitral valve is normal in structure. Trivial mitral valve  regurgitation. No evidence of mitral stenosis.  4. The aortic valve is  tricuspid. There is mild calcification of the aortic valve. Aortic valve regurgitation is moderate. Aortic valve sclerosis/calcification is present, without any evidence of aortic stenosis. Aortic regurgitation PHT measures 347 msec. Aortic valve mean gradient measures 4.5 mmHg. Aortic valve Vmax measures 1.48 m/s.  5. Aortic dilatation noted. There is borderline dilatation of the ascending aorta, measuring 37 mm.  6. The inferior vena cava is normal in size with greater than 50% respiratory variability, suggesting right atrial pressure of 3 mmHg. FINDINGS  Left Ventricle: Left ventricular ejection fraction, by estimation, is 60 to 65%. The left ventricle has normal function. The left ventricle has no regional wall motion abnormalities. The left ventricular internal cavity size was normal in size. There is  mild concentric left ventricular hypertrophy. Left ventricular diastolic parameters are consistent with Grade I diastolic dysfunction (impaired relaxation). Right Ventricle: The right ventricular size is normal. No increase in right ventricular wall thickness. Right ventricular systolic function is normal. There is mildly elevated pulmonary artery systolic pressure. The tricuspid regurgitant velocity is 2.71  m/s, and with an assumed right atrial pressure of 5 mmHg, the estimated right ventricular systolic pressure is 34.4 mmHg. Left Atrium: Left atrial size was normal in size. Right Atrium: Right atrial size was normal in size. Pericardium: There is no evidence of pericardial effusion. Mitral Valve: The mitral valve is normal in structure. Trivial mitral valve regurgitation. No evidence of mitral valve stenosis. MV peak gradient, 5.6 mmHg. The mean mitral valve gradient is 2.0 mmHg. Tricuspid Valve: The tricuspid valve is normal in structure. Tricuspid valve regurgitation is mild . No evidence of tricuspid stenosis. Aortic Valve: The aortic valve is tricuspid. There is mild calcification of the aortic valve.  Aortic valve regurgitation is moderate. Aortic regurgitation PHT measures 347 msec. Aortic valve sclerosis/calcification is present, without any evidence of aortic stenosis. Aortic valve mean gradient measures 4.5 mmHg. Aortic valve peak gradient measures 8.8 mmHg. Aortic valve area, by VTI measures 2.34 cm. Pulmonic Valve: The pulmonic valve was normal in structure. Pulmonic valve regurgitation is not visualized. No evidence of pulmonic stenosis. Aorta: The aortic root is normal in size and structure and aortic dilatation noted. There is borderline dilatation of the ascending aorta, measuring 37 mm. Venous: The inferior vena cava is normal in size with greater than 50% respiratory variability, suggesting right atrial pressure of 3 mmHg. IAS/Shunts: No atrial level shunt detected by color flow Doppler.  LEFT VENTRICLE PLAX 2D LVIDd:         4.30 cm     Diastology LVIDs:         2.80 cm     LV e' medial:    6.73 cm/s LV PW:         0.80 cm     LV E/e' medial:  12.9 LV IVS:        1.10 cm     LV e' lateral:   9.75 cm/s LVOT diam:     1.90 cm     LV E/e' lateral: 8.9 LV SV:         88 LV SV Index:   55 LVOT Area:     2.84 cm  LV Volumes (MOD) LV vol d, MOD A2C: 64.0 ml LV vol d, MOD A4C: 52.4 ml LV vol s, MOD A2C: 22.1 ml LV vol s, MOD A4C: 19.5 ml LV SV MOD A2C:     41.9 ml LV SV MOD  A4C:     52.4 ml LV SV MOD BP:      36.9 ml RIGHT VENTRICLE             IVC RV Basal diam:  3.30 cm     IVC diam: 1.80 cm RV S prime:     12.20 cm/s TAPSE (M-mode): 1.9 cm LEFT ATRIUM             Index        RIGHT ATRIUM          Index LA diam:        3.50 cm 2.17 cm/m   RA Area:     9.43 cm LA Vol (A2C):   46.2 ml 28.64 ml/m  RA Volume:   16.40 ml 10.17 ml/m LA Vol (A4C):   33.6 ml 20.83 ml/m LA Biplane Vol: 39.5 ml 24.49 ml/m  AORTIC VALVE AV Area (Vmax):    2.30 cm AV Area (Vmean):   2.18 cm AV Area (VTI):     2.34 cm AV Vmax:           148.00 cm/s AV Vmean:          101.100 cm/s AV VTI:            0.376 m AV Peak Grad:       8.8 mmHg AV Mean Grad:      4.5 mmHg LVOT Vmax:         120.00 cm/s LVOT Vmean:        77.800 cm/s LVOT VTI:          0.310 m LVOT/AV VTI ratio: 0.83 AI PHT:            347 msec AR Vena Contracta: 0.60 cm  AORTA Ao Root diam: 3.00 cm Ao Asc diam:  3.70 cm MITRAL VALVE                TRICUSPID VALVE MV Area (PHT): 4.68 cm     TR Peak grad:   29.4 mmHg MV Area VTI:   2.41 cm     TR Vmax:        271.00 cm/s MV Peak grad:  5.6 mmHg MV Mean grad:  2.0 mmHg     SHUNTS MV Vmax:       1.18 m/s     Systemic VTI:  0.31 m MV Vmean:      65.3 cm/s    Systemic Diam: 1.90 cm MV Decel Time: 162 msec MV E velocity: 86.70 cm/s MV A velocity: 109.00 cm/s MV E/A ratio:  0.80 Arvilla Meres MD Electronically signed by Arvilla Meres MD Signature Date/Time: 07/04/2022/10:42:10 AM    Final    DG Chest Port 1 View  Result Date: 07/04/2022 CLINICAL DATA:  Syncopal episode. EXAM: PORTABLE CHEST 1 VIEW COMPARISON:  PA and lateral 09/29/2003 FINDINGS: The heart size and mediastinal contours are within normal limits. There are old CABG changes and calcifications in the transverse aorta. Both lungs are clear. The visualized skeletal structures are unremarkable. IMPRESSION: No evidence of acute chest disease. Electronically Signed   By: Almira Bar M.D.   On: 07/04/2022 07:40    Procedures Procedures    Medications Ordered in ED Medications  sodium chloride 0.9 % bolus 1,000 mL (0 mLs Intravenous Stopped 07/04/22 0834)    And  0.9 %  sodium chloride infusion ( Intravenous New Bag/Given 07/04/22 0840)    ED Course/ Medical Decision Making/ A&P  Medical Decision Making Amount and/or Complexity of Data Reviewed Labs: ordered. Radiology: ordered.  Risk Prescription drug management.   This patient presents to the ED for concern of syncope, this involves an extensive number of treatment options, and is a complaint that carries with it a high risk of complications and morbidity.  The  differential diagnosis includes cardiogenic, vasovagal, orthostatic   Co morbidities that complicate the patient evaluation  CAD s/p CABG, HTN, HLD, tobacco abuse and cervical cancer   Additional history obtained:  Additional history obtained from epic chart review External records from outside source obtained and reviewed including EMS report   Lab Tests:  I Ordered, and personally interpreted labs.  The pertinent results include:  cbc with hgb 11.7 (12.5 on 2/12); bmp with cr elevated at 1.39 (1.14 on 2/12)   Imaging Studies ordered:  I ordered imaging studies including cxr and ECHO I independently visualized and interpreted imaging which showed  CXR: No evidence of acute chest disease.  ECHO:   1. Left ventricular ejection fraction, by estimation, is 60 to 65%. The  left ventricle has normal function. The left ventricle has no regional  wall motion abnormalities. There is mild concentric left ventricular  hypertrophy. Left ventricular diastolic  parameters are consistent with Grade I diastolic dysfunction (impaired  relaxation).   2. Right ventricular systolic function is normal. The right ventricular  size is normal. There is mildly elevated pulmonary artery systolic  pressure. The estimated right ventricular systolic pressure is 34.4 mmHg.   3. The mitral valve is normal in structure. Trivial mitral valve  regurgitation. No evidence of mitral stenosis.   4. The aortic valve is tricuspid. There is mild calcification of the  aortic valve. Aortic valve regurgitation is moderate. Aortic valve  sclerosis/calcification is present, without any evidence of aortic  stenosis. Aortic regurgitation PHT measures 347  msec. Aortic valve mean gradient measures 4.5 mmHg. Aortic valve Vmax  measures 1.48 m/s.   5. Aortic dilatation noted. There is borderline dilatation of the  ascending aorta, measuring 37 mm.   6. The inferior vena cava is normal in size with greater than 50%   respiratory variability, suggesting right atrial pressure of 3 mmHg.   I agree with the radiologist interpretation   Cardiac Monitoring:  The patient was maintained on a cardiac monitor.  I personally viewed and interpreted the cardiac monitored which showed an underlying rhythm of: nsr   Medicines ordered and prescription drug management:  I ordered medication including ivfs  for sx  Reevaluation of the patient after these medicines showed that the patient improved I have reviewed the patients home medicines and have made adjustments as needed   Test Considered:  ECHO   Critical Interventions:  IVFs   Problem List / ED Course:  Syncope:  likely orthostatic related to hot water.  BP on the low side today.  I am going to have her go down to 5 mg of amlodipine and keep an eye on her bps at home.  She feels ok with standing now.  ECHO without anything significant.  She is to return if worse.  F/u with pcp.   Reevaluation:  After the interventions noted above, I reevaluated the patient and found that they have :improved   Social Determinants of Health:  Lives at home with husband   Dispostion:  After consideration of the diagnostic results and the patients response to treatment, I feel that the patent would benefit from discharge with outpatient f/u.  Final Clinical Impression(s) / ED Diagnoses Final diagnoses:  Syncope, unspecified syncope type    Rx / DC Orders ED Discharge Orders          Ordered    amLODipine (NORVASC) 10 MG tablet  Daily        07/04/22 1231              Jacalyn Lefevre, MD 07/04/22 1231

## 2022-07-04 NOTE — Discharge Instructions (Addendum)
Don't take your blood pressure medicine today.  (Amlodipine and Irbesartan).  If your blood pressure is under 120 tomorrow, don't take your blood pressure medicine.  If it is over that, you can start taking your medication again, but decrease amlodipine to 5 mg.  Measure your blood pressures daily and make a follow up appointment with your doctor.

## 2022-07-04 NOTE — ED Triage Notes (Signed)
Pt BIBGEMS from home after syncopal episode, got up out of bed and felt dizzy. Went to shower and took hot shower then had a syncopal episode with increased nausea, one emesis with EMS. Pink, warm, and dry. Fell in bathroom denies hitting head. Pt due for echo today.  Hx triple bypass   Zofran IV  500 NS 20 L forearm   132/46 58 hr  CBG 154 100% ra Rr 14

## 2022-07-05 ENCOUNTER — Telehealth: Payer: Self-pay

## 2022-07-05 NOTE — Transitions of Care (Post Inpatient/ED Visit) (Signed)
   07/05/2022  Name: [name] MRN: [id number] DOB: 10-28-1945  Today's TOC FU Call Status: Today's TOC FU Call Status:: Successful TOC FU Call Competed TOC FU Call Complete Date: 07/05/22  Transition Care Management Follow-up Telephone Call Date of Discharge: 07/04/22 Discharge Facility: [facility]) Type of Discharge: Emergency Department Reason for ED Visit: Other: (syncope) How have you been since you were released from the hospital?: Better Any questions or concerns?: No  Items Reviewed: Did you receive and understand the discharge instructions provided?: Yes Medications obtained and verified?: Yes (Medications Reviewed) Any new allergies since your discharge?: No Dietary orders reviewed?: Yes Do you have support at home?: Yes People in Home: spouse  Home Care and Equipment/Supplies: Were Home Health Services Ordered?: NA Any new equipment or medical supplies ordered?: NA  Functional Questionnaire: Do you need assistance with bathing/showering or dressing?: No Do you need assistance with meal preparation?: No Do you need assistance with eating?: No Do you have difficulty maintaining continence: No Do you need assistance with getting out of bed/getting out of a chair/moving?: No Do you have difficulty managing or taking your medications?: No  Follow up appointments reviewed: PCP Follow-up appointment confirmed?: No MD Provider Line Number:[phone] Given: Yes Specialist Hospital Follow-up appointment confirmed?: No Reason Specialist Follow-Up Not Confirmed: Patient has Specialist Provider Number and will Call for Appointment Do you need transportation to your follow-up appointment?: No Do you understand care options if your condition(s) worsen?: Yes-patient verbalized understanding    SIGNATURE Karena Addison, LPN [facility] Nurse Health Advisor Direct Dial [phone]

## 2022-07-11 ENCOUNTER — Telehealth: Payer: Self-pay

## 2022-07-11 NOTE — Telephone Encounter (Signed)
-----   Message from Pricilla Riffle, MD sent at 07/06/2022  4:38 PM EDT ----- Pt seen in ER for syncope    I saw her in fall 2023   Needs follow up in cardiology

## 2022-07-11 NOTE — Telephone Encounter (Signed)
Appt for her post hosp made for 07/29/22 with Dr Tenny Craw.  Pts husband says that she has been feeling well and her BP has been good he will start keeping a log... she is back at work at [employer] and will let us know if anything changes prior to her appt.

## 2022-07-28 NOTE — Progress Notes (Signed)
Cardiology Office Note   Date:  07/29/2022   ID:  Karen, Hughes [birth year], MRN [id number]  PCP:  Etta Grandchild, MD  Cardiologist:   Dietrich Pates, MD   F/U of CAD     History of Present Illness: [name] is a 77 y.o. female with a history ofCAD s/p CABG in 2005 (LIMA to LAD; SVG to PDA; SVG to Diag; normal myovuiew in 2006 and 2022), hypertension, hyperlipidemia and prior tobacco abuse (25 pack year hx, quit in 1990s)  I saw the pt in May 2022  Since seen she says she says she feels good   Denies CP  NO SOB  Walks about 20 min per day    No  change in pace    I last saw the pt in clinic in  October 2023    On July 04, 2022   was in shower  Got dizzy   Then passed out  Went to ER    still felt dizz  Rx Zofran and NS 500 cc  Since then she has cut back on her amldopine to 5 mg   BP checks at home   SBP mainly in 110s/120s systolic    The pt says she feels fine  Not dizzy  Breathing is OK  No CP  She admits to liking warm showers     Current Meds  Medication Sig   amLODipine (NORVASC) 10 MG tablet Take 0.5 tablets (5 mg total) by mouth daily.   aspirin EC 81 MG tablet TAKE 1 TABLET BY MOUTH ONCE DAILY (SWALLOW WHOLE)   cholecalciferol (VITAMIN D3) 25 MCG (1000 UNIT) tablet Take 1,000 Units by mouth daily.   irbesartan (AVAPRO) 150 MG tablet Take 1 tablet (150 mg total) by mouth daily.   nitroGLYCERIN (NITROSTAT) 0.4 MG SL tablet Place 1 tablet under the tongue every 5 minutes as needed for chest pain (3 DOSES then CALL 911).   rosuvastatin (CRESTOR) 40 MG tablet Take 1 tablet (40 mg total) by mouth daily.     Allergies:   Patient has no known allergies.   Past Medical History:  Diagnosis Date   CAD (coronary artery disease)    Cervical cancer (HCC)    Dr Ambrose Mantle   Hyperlipidemia    Hypertension    Tobacco abuse     Past Surgical History:  Procedure Laterality Date   CORONARY ARTERY BYPASS GRAFT       Social History:  The patient  reports that she  quit smoking about 37 years ago. Her smoking use included cigarettes. She has never used smokeless tobacco. She reports that she does not drink alcohol and does not use drugs.   Family History:  The patient's family history includes Coronary artery disease in an other family member; Diabetes in an other family member; Heart attack in her father; Hypertension in her mother and another family member.    ROS:  Please see the history of present illness. All other systems are reviewed and  Negative to the above problem except as noted.    PHYSICAL EXAM: VS:  BP (!) 178/60   Pulse 80   Ht 5\' 4"  (1.626 m)   Wt 121 lb (54.9 kg)   SpO2 96%   BMI 20.77 kg/m    BP 150/70      GEN: Well nourished, well developed, in no acute distress  HEENT: normal  Neck: no JVD, carotid bruits Cardiac: RRR;  I-II/VI systoli murmur base  No  diastolic murmurs  No  LE edema  Respiratory:  clear to auscultation bilaterally GI: soft, nontender,  No hepatomegaly   EKG:  EKG is not ordered today.  Echo   06/2022  1. Left ventricular ejection fraction, by estimation, is 60 to 65%. The  left ventricle has normal function. The left ventricle has no regional  wall motion abnormalities. There is mild concentric left ventricular  hypertrophy. Left ventricular diastolic  parameters are consistent with Grade I diastolic dysfunction (impaired  relaxation).   2. Right ventricular systolic function is normal. The right ventricular  size is normal. There is mildly elevated pulmonary artery systolic  pressure. The estimated right ventricular systolic pressure is 34.4 mmHg.   3. The mitral valve is normal in structure. Trivial mitral valve  regurgitation. No evidence of mitral stenosis.   4. The aortic valve is tricuspid. There is mild calcification of the  aortic valve. Aortic valve regurgitation is moderate. Aortic valve  sclerosis/calcification is present, without any evidence of aortic  stenosis. Aortic regurgitation  PHT measures 347  msec. Aortic valve mean gradient measures 4.5 mmHg. Aortic valve Vmax  measures 1.48 m/s.   5. Aortic dilatation noted. There is borderline dilatation of the  ascending aorta, measuring 37 mm.   6. The inferior vena cava is normal in size with greater than 50%  respiratory variability, suggesting right atrial pressure of 3 mmHg.   Lipid Panel    Component Value Date/Time   CHOL 140 10/21/2021 1552   CHOL 149 11/12/2020 0754   TRIG 77.0 10/21/2021 1552   HDL 65.80 10/21/2021 1552   HDL 73 11/12/2020 0754   CHOLHDL 2 10/21/2021 1552   VLDL 15.4 10/21/2021 1552   LDLCALC 59 10/21/2021 1552   LDLCALC 61 11/12/2020 0754      Wt Readings from Last 3 Encounters:  07/29/22 121 lb (54.9 kg)  07/04/22 127 lb (57.6 kg)  04/25/22 128 lb (58.1 kg)      ASSESSMENT AND PLAN:  1  Syncope   ONe episode   Sounds orthostatic  After  hot shower  seen in ER     Since then BP has been very good   No dizziness   Today BP high  Strange  Recomm Follow   Avoid overheating   Stay hdrated     2  HTN    BP high today  Not at other times  Come back to clinc with cuff to make sure accurate   3   CAD  Pt with remote CABG    2005 .    Myoview in 2022 was normal  Pt without CP  Breathing is OK   4  AV dz   I have reviewed echo with pt    AI is moderate, not severe   Follow with serial echoes  Control BP  5  HL  will repeat lipids today    Check Lipomed panel, BMET and Hgb A1C       Follow up with me in the fall   Here for nurses visit in 1 month with BP cuff to confirm accuracy     Current medicines are reviewed at length with the patient today.  The patient does not have concerns regarding medicines.  Signed, Dietrich Pates, MD  07/29/2022 2:59 PM    [facility] [address] [place], [place], [place]  [address] Phone: [phone]; Fax: [phone]

## 2022-07-29 ENCOUNTER — Encounter: Payer: Self-pay | Admitting: Internal Medicine

## 2022-07-29 ENCOUNTER — Ambulatory Visit: Payer: Commercial Managed Care - PPO | Attending: Internal Medicine | Admitting: Internal Medicine

## 2022-07-29 VITALS — BP 178/60 | HR 80 | Ht 64.0 in | Wt 121.0 lb

## 2022-07-29 DIAGNOSIS — I2510 Atherosclerotic heart disease of native coronary artery without angina pectoris: Secondary | ICD-10-CM

## 2022-07-29 DIAGNOSIS — Z13220 Encounter for screening for lipoid disorders: Secondary | ICD-10-CM

## 2022-07-29 DIAGNOSIS — Z79899 Other long term (current) drug therapy: Secondary | ICD-10-CM | POA: Diagnosis not present

## 2022-07-29 DIAGNOSIS — I10 Essential (primary) hypertension: Secondary | ICD-10-CM | POA: Diagnosis not present

## 2022-07-29 DIAGNOSIS — Z131 Encounter for screening for diabetes mellitus: Secondary | ICD-10-CM

## 2022-07-29 NOTE — Patient Instructions (Addendum)
Medication Instructions:   *If you need a refill on your cardiac medications before your next appointment, please call your pharmacy*   Lab Work: Nmr, bmet, hgbaqc If you have labs (blood work) drawn today and your tests are completely normal, you will receive your results only by: MyChart Message (if you have MyChart) OR A paper copy in the mail If you have any lab test that is abnormal or we need to change your treatment, we will call you to review the results.   Testing/Procedures:    Follow-Up: At [facility], you and your health needs are our priority.  As part of our continuing mission to provide you with exceptional heart care, we have created designated Provider Care Teams.  These Care Teams include your primary Cardiologist (physician) and Advanced Practice Providers (APPs -  Physician Assistants and Nurse Practitioners) who all work together to provide you with the care you need, when you need it.  We recommend signing up for the patient portal called "MyChart".  Sign up information is provided on this After Visit Summary.  MyChart is used to connect with patients for Virtual Visits (Telemedicine).  Patients are able to view lab/test results, encounter notes, upcoming appointments, etc.  Non-urgent messages can be sent to your provider as well.   To learn more about what you can do with MyChart, go to [email or web address].    Your next appointment: BP with Nurse on August 15, 2022 at 2:00 pm bring your cuff with you.

## 2022-07-30 ENCOUNTER — Other Ambulatory Visit: Payer: Self-pay | Admitting: Internal Medicine

## 2022-07-30 ENCOUNTER — Other Ambulatory Visit (HOSPITAL_COMMUNITY): Payer: Self-pay

## 2022-07-30 DIAGNOSIS — I2510 Atherosclerotic heart disease of native coronary artery without angina pectoris: Secondary | ICD-10-CM

## 2022-07-30 LAB — NMR, LIPOPROFILE
Cholesterol, Total: 142 mg/dL (ref 100–199)
HDL Particle Number: 36.1 umol/L (ref >=30.5)
HDL-C: 75 mg/dL (ref >39)
LDL Particle Number: 410 nmol/L (ref <1000)
LDL Size: 21.2 nm (ref >20.5)
LDL-C (NIH Calc): 52 mg/dL (ref 0–99)
LP-IR Score: <25 (ref <=45)
Small LDL Particle Number: <90 nmol/L (ref <=527)
Triglycerides: 75 mg/dL (ref 0–149)

## 2022-07-30 LAB — BASIC METABOLIC PANEL
BUN/Creatinine Ratio: 10 — ABNORMAL LOW (ref 12–28)
BUN: 13 mg/dL (ref 8–27)
CO2: 21 mmol/L (ref 20–29)
Calcium: 9.4 mg/dL (ref 8.7–10.3)
Chloride: 106 mmol/L (ref 96–106)
Creatinine, Ser: 1.25 mg/dL — ABNORMAL HIGH (ref 0.57–1.00)
Glucose: 86 mg/dL (ref 70–99)
Potassium: 4.5 mmol/L (ref 3.5–5.2)
Sodium: 143 mmol/L (ref 134–144)
eGFR: 45 mL/min/{1.73_m2} — ABNORMAL LOW (ref >59)

## 2022-07-30 LAB — HEMOGLOBIN A1C
Est. average glucose Bld gHb Est-mCnc: 114 mg/dL
Hgb A1c MFr Bld: 5.6 % (ref 4.8–5.6)

## 2022-07-30 MED ORDER — ASPIRIN 81 MG PO TBEC
81.0000 mg | DELAYED_RELEASE_TABLET | Freq: Every day | ORAL | 1 refills | Status: DC
Start: 2022-07-30 — End: 2023-02-27
  Filled 2022-07-30: qty 90, 90d supply, fill #0
  Filled 2022-11-21: qty 90, 90d supply, fill #1

## 2022-08-15 ENCOUNTER — Ambulatory Visit: Payer: Commercial Managed Care - PPO | Attending: Internal Medicine

## 2022-08-15 ENCOUNTER — Ambulatory Visit: Payer: Commercial Managed Care - PPO | Admitting: Internal Medicine

## 2022-08-15 VITALS — BP 106/64 | HR 61

## 2022-08-15 DIAGNOSIS — I10 Essential (primary) hypertension: Secondary | ICD-10-CM | POA: Diagnosis not present

## 2022-08-15 DIAGNOSIS — Z79899 Other long term (current) drug therapy: Secondary | ICD-10-CM | POA: Diagnosis not present

## 2022-08-15 NOTE — Progress Notes (Signed)
Nurse visit for BP check.. BP last OV with Dr Tenny Craw 07/29/22 178/60.   Pt check with me today 104/60 and 106/64 HR 61.   Pt says she feels well. I checked her BP cuff from home and her BP was 118/49.   Her machine requires her to check her BP with arm and machine at heart level so she will start doing it at the kitchen table.   Pt says that she feels well and will continue to monitor.   Pt will continue her meds.. she will keep log and be sure BP not creeping up and not running too low. She will hydrate when her BP low.   I will forward to Dr Tenny Craw.

## 2022-08-15 NOTE — Patient Instructions (Signed)
Medication Instructions:   *If you need a refill on your cardiac medications before your next appointment, please call your pharmacy*   Lab Work:  If you have labs (blood work) drawn today and your tests are completely normal, you will receive your results only by: MyChart Message (if you have MyChart) OR A paper copy in the mail If you have any lab test that is abnormal or we need to change your treatment, we will call you to review the results.   Testing/Procedures:    Follow-Up: At [facility], you and your health needs are our priority.  As part of our continuing mission to provide you with exceptional heart care, we have created designated Provider Care Teams.  These Care Teams include your primary Cardiologist (physician) and Advanced Practice Providers (APPs -  Physician Assistants and Nurse Practitioners) who all work together to provide you with the care you need, when you need it.  We recommend signing up for the patient portal called "MyChart".  Sign up information is provided on this After Visit Summary.  MyChart is used to connect with patients for Virtual Visits (Telemedicine).  Patients are able to view lab/test results, encounter notes, upcoming appointments, etc.  Non-urgent messages can be sent to your provider as well.   To learn more about what you can do with MyChart, go to [email or web address].    

## 2022-09-13 ENCOUNTER — Other Ambulatory Visit (HOSPITAL_COMMUNITY): Payer: Self-pay

## 2022-09-13 ENCOUNTER — Other Ambulatory Visit (HOSPITAL_COMMUNITY): Payer: Self-pay | Admitting: Emergency Medicine

## 2022-09-13 ENCOUNTER — Other Ambulatory Visit: Payer: Self-pay

## 2022-09-13 DIAGNOSIS — I10 Essential (primary) hypertension: Secondary | ICD-10-CM

## 2022-09-13 DIAGNOSIS — N1832 Chronic kidney disease, stage 3b: Secondary | ICD-10-CM

## 2022-09-13 DIAGNOSIS — I119 Hypertensive heart disease without heart failure: Secondary | ICD-10-CM

## 2022-09-14 ENCOUNTER — Other Ambulatory Visit (HOSPITAL_COMMUNITY): Payer: Self-pay

## 2022-10-24 ENCOUNTER — Encounter: Payer: Self-pay | Admitting: Internal Medicine

## 2022-10-24 ENCOUNTER — Ambulatory Visit: Payer: Commercial Managed Care - PPO | Admitting: Internal Medicine

## 2022-10-24 VITALS — BP 142/56 | HR 67 | Temp 98.0°F | Resp 16 | Ht 64.0 in | Wt 120.0 lb

## 2022-10-24 DIAGNOSIS — I119 Hypertensive heart disease without heart failure: Secondary | ICD-10-CM | POA: Diagnosis not present

## 2022-10-24 DIAGNOSIS — D696 Thrombocytopenia, unspecified: Secondary | ICD-10-CM | POA: Insufficient documentation

## 2022-10-24 DIAGNOSIS — E785 Hyperlipidemia, unspecified: Secondary | ICD-10-CM | POA: Diagnosis not present

## 2022-10-24 DIAGNOSIS — D539 Nutritional anemia, unspecified: Secondary | ICD-10-CM | POA: Insufficient documentation

## 2022-10-24 DIAGNOSIS — E559 Vitamin D deficiency, unspecified: Secondary | ICD-10-CM | POA: Diagnosis not present

## 2022-10-24 DIAGNOSIS — N184 Chronic kidney disease, stage 4 (severe): Secondary | ICD-10-CM | POA: Diagnosis not present

## 2022-10-24 DIAGNOSIS — Z0001 Encounter for general adult medical examination with abnormal findings: Secondary | ICD-10-CM

## 2022-10-24 DIAGNOSIS — N1832 Chronic kidney disease, stage 3b: Secondary | ICD-10-CM

## 2022-10-24 DIAGNOSIS — Z0000 Encounter for general adult medical examination without abnormal findings: Secondary | ICD-10-CM | POA: Diagnosis not present

## 2022-10-24 LAB — RETICULOCYTES
ABS Retic: 44640 cells/uL (ref 20000–80000)
Retic Ct Pct: 1.2 %

## 2022-10-24 NOTE — Progress Notes (Unsigned)
PRE-PROCEDURE EXAM: Left TM cannot be visualized due to total occlusion/impaction of the ear canal.  PROCEDURE INDICATION: remove wax to visualize ear drum & relieve discomfort  CONSENT:  Verbal     PROCEDURE NOTE:     RIGHT EAR:  I used warm water irrigation under direct visualization with the otoscope to free the wax bolus from the ear canal.    POST- PROCEDURE EXAM: TMs successfully visualized and found to have no erythema     The patient tolerated the procedure well.   

## 2022-10-24 NOTE — Progress Notes (Unsigned)
Subjective:  Patient ID: [name], female    DOB: [birth year]  Age: 77 y.o. MRN: [id number]  CC: Anemia, Annual Exam, Hypertension, and Hyperlipidemia   HPI MINETTE VASSEUR presents for a CPX and f/up ----  Outpatient Medications Prior to Visit  Medication Sig Dispense Refill   amLODipine (NORVASC) 10 MG tablet Take 0.5 tablets (5 mg total) by mouth daily. 30 tablet 0   aspirin EC 81 MG tablet Take 1 tablet (81 mg total) by mouth daily (swallow whole) 90 tablet 1   cholecalciferol (VITAMIN D3) 25 MCG (1000 UNIT) tablet Take 1,000 Units by mouth daily.     irbesartan (AVAPRO) 150 MG tablet Take 1 tablet (150 mg total) by mouth daily. 90 tablet 1   nitroGLYCERIN (NITROSTAT) 0.4 MG SL tablet Place 1 tablet under the tongue every 5 minutes as needed for chest pain (3 DOSES then CALL 911). 25 tablet 6   rosuvastatin (CRESTOR) 40 MG tablet Take 1 tablet (40 mg total) by mouth daily. 90 tablet 3   No facility-administered medications prior to visit.    ROS Review of Systems  Objective:  BP (!) 142/56 (BP Location: Left Arm, Patient Position: Sitting, Cuff Size: Large)   Pulse 67   Temp 98 F (36.7 C) (Oral)   Resp 16   Ht 5\' 4"  (1.626 m)   Wt 120 lb (54.4 kg)   SpO2 98%   BMI 20.60 kg/m   BP Readings from Last 3 Encounters:  10/24/22 (!) 142/56  08/15/22 106/64  07/29/22 (!) 178/60    Wt Readings from Last 3 Encounters:  10/24/22 120 lb (54.4 kg)  07/29/22 121 lb (54.9 kg)  07/04/22 127 lb (57.6 kg)    Physical Exam  Lab Results  Component Value Date   WBC 7.9 07/04/2022   HGB 11.7 (L) 07/04/2022   HCT 33.1 (L) 07/04/2022   PLT 121 (L) 07/04/2022   GLUCOSE 86 07/29/2022   CHOL 140 10/21/2021   TRIG 77.0 10/21/2021   HDL 65.80 10/21/2021   LDLCALC 59 10/21/2021   ALT 17 10/21/2021   AST 24 10/21/2021   NA 143 07/29/2022   K 4.5 07/29/2022   CL 106 07/29/2022   CREATININE 1.25 (H) 07/29/2022   BUN 13 07/29/2022   CO2 21 07/29/2022   TSH 2.98  10/21/2021   HGBA1C 5.6 07/29/2022    ECHOCARDIOGRAM COMPLETE  Result Date: 07/04/2022    ECHOCARDIOGRAM REPORT   Patient Name:   [name] Date of Exam: 07/04/2022 Medical Rec #:  [id number]       Height:       64.0 in Accession #:    [id number]      Weight:       127.0 lb Date of Birth:  Sep 21, 1945       BSA:          1.613 m Patient Age:    76 years        BP:           103/88 mmHg Patient Gender: F               HR:           58 bpm. Exam Location:  Inpatient Procedure: 2D Echo, Cardiac Doppler and Color Doppler Indications:    Murmur  History:        Patient has no prior history of Echocardiogram examinations.  Risk Factors:Dyslipidemia and Hypertension.  Sonographer:    Wallie Char Referring Phys: [id number] JULIE HAVILAND IMPRESSIONS  1. Left ventricular ejection fraction, by estimation, is 60 to 65%. The left ventricle has normal function. The left ventricle has no regional wall motion abnormalities. There is mild concentric left ventricular hypertrophy. Left ventricular diastolic parameters are consistent with Grade I diastolic dysfunction (impaired relaxation).  2. Right ventricular systolic function is normal. The right ventricular size is normal. There is mildly elevated pulmonary artery systolic pressure. The estimated right ventricular systolic pressure is 34.4 mmHg.  3. The mitral valve is normal in structure. Trivial mitral valve regurgitation. No evidence of mitral stenosis.  4. The aortic valve is tricuspid. There is mild calcification of the aortic valve. Aortic valve regurgitation is moderate. Aortic valve sclerosis/calcification is present, without any evidence of aortic stenosis. Aortic regurgitation PHT measures 347 msec. Aortic valve mean gradient measures 4.5 mmHg. Aortic valve Vmax measures 1.48 m/s.  5. Aortic dilatation noted. There is borderline dilatation of the ascending aorta, measuring 37 mm.  6. The inferior vena cava is normal in size with greater than 50%  respiratory variability, suggesting right atrial pressure of 3 mmHg. FINDINGS  Left Ventricle: Left ventricular ejection fraction, by estimation, is 60 to 65%. The left ventricle has normal function. The left ventricle has no regional wall motion abnormalities. The left ventricular internal cavity size was normal in size. There is  mild concentric left ventricular hypertrophy. Left ventricular diastolic parameters are consistent with Grade I diastolic dysfunction (impaired relaxation). Right Ventricle: The right ventricular size is normal. No increase in right ventricular wall thickness. Right ventricular systolic function is normal. There is mildly elevated pulmonary artery systolic pressure. The tricuspid regurgitant velocity is 2.71  m/s, and with an assumed right atrial pressure of 5 mmHg, the estimated right ventricular systolic pressure is 34.4 mmHg. Left Atrium: Left atrial size was normal in size. Right Atrium: Right atrial size was normal in size. Pericardium: There is no evidence of pericardial effusion. Mitral Valve: The mitral valve is normal in structure. Trivial mitral valve regurgitation. No evidence of mitral valve stenosis. MV peak gradient, 5.6 mmHg. The mean mitral valve gradient is 2.0 mmHg. Tricuspid Valve: The tricuspid valve is normal in structure. Tricuspid valve regurgitation is mild . No evidence of tricuspid stenosis. Aortic Valve: The aortic valve is tricuspid. There is mild calcification of the aortic valve. Aortic valve regurgitation is moderate. Aortic regurgitation PHT measures 347 msec. Aortic valve sclerosis/calcification is present, without any evidence of aortic stenosis. Aortic valve mean gradient measures 4.5 mmHg. Aortic valve peak gradient measures 8.8 mmHg. Aortic valve area, by VTI measures 2.34 cm. Pulmonic Valve: The pulmonic valve was normal in structure. Pulmonic valve regurgitation is not visualized. No evidence of pulmonic stenosis. Aorta: The aortic root is normal in  size and structure and aortic dilatation noted. There is borderline dilatation of the ascending aorta, measuring 37 mm. Venous: The inferior vena cava is normal in size with greater than 50% respiratory variability, suggesting right atrial pressure of 3 mmHg. IAS/Shunts: No atrial level shunt detected by color flow Doppler.  LEFT VENTRICLE PLAX 2D LVIDd:         4.30 cm     Diastology LVIDs:         2.80 cm     LV e' medial:    6.73 cm/s LV PW:         0.80 cm     LV E/e' medial:  12.9 LV  IVS:        1.10 cm     LV e' lateral:   9.75 cm/s LVOT diam:     1.90 cm     LV E/e' lateral: 8.9 LV SV:         88 LV SV Index:   55 LVOT Area:     2.84 cm  LV Volumes (MOD) LV vol d, MOD A2C: 64.0 ml LV vol d, MOD A4C: 52.4 ml LV vol s, MOD A2C: 22.1 ml LV vol s, MOD A4C: 19.5 ml LV SV MOD A2C:     41.9 ml LV SV MOD A4C:     52.4 ml LV SV MOD BP:      36.9 ml RIGHT VENTRICLE             IVC RV Basal diam:  3.30 cm     IVC diam: 1.80 cm RV S prime:     12.20 cm/s TAPSE (M-mode): 1.9 cm LEFT ATRIUM             Index        RIGHT ATRIUM          Index LA diam:        3.50 cm 2.17 cm/m   RA Area:     9.43 cm LA Vol (A2C):   46.2 ml 28.64 ml/m  RA Volume:   16.40 ml 10.17 ml/m LA Vol (A4C):   33.6 ml 20.83 ml/m LA Biplane Vol: 39.5 ml 24.49 ml/m  AORTIC VALVE AV Area (Vmax):    2.30 cm AV Area (Vmean):   2.18 cm AV Area (VTI):     2.34 cm AV Vmax:           148.00 cm/s AV Vmean:          101.100 cm/s AV VTI:            0.376 m AV Peak Grad:      8.8 mmHg AV Mean Grad:      4.5 mmHg LVOT Vmax:         120.00 cm/s LVOT Vmean:        77.800 cm/s LVOT VTI:          0.310 m LVOT/AV VTI ratio: 0.83 AI PHT:            347 msec AR Vena Contracta: 0.60 cm  AORTA Ao Root diam: 3.00 cm Ao Asc diam:  3.70 cm MITRAL VALVE                TRICUSPID VALVE MV Area (PHT): 4.68 cm     TR Peak grad:   29.4 mmHg MV Area VTI:   2.41 cm     TR Vmax:        271.00 cm/s MV Peak grad:  5.6 mmHg MV Mean grad:  2.0 mmHg     SHUNTS MV Vmax:        1.18 m/s     Systemic VTI:  0.31 m MV Vmean:      65.3 cm/s    Systemic Diam: 1.90 cm MV Decel Time: 162 msec MV E velocity: 86.70 cm/s MV A velocity: 109.00 cm/s MV E/A ratio:  0.80 Arvilla Meres MD Electronically signed by Arvilla Meres MD Signature Date/Time: 07/04/2022/10:42:10 AM    Final    DG Chest Port 1 View  Result Date: 07/04/2022 CLINICAL DATA:  Syncopal episode. EXAM: PORTABLE CHEST 1 VIEW COMPARISON:  PA and lateral 09/29/2003 FINDINGS: The heart size and  mediastinal contours are within normal limits. There are old CABG changes and calcifications in the transverse aorta. Both lungs are clear. The visualized skeletal structures are unremarkable. IMPRESSION: No evidence of acute chest disease. Electronically Signed   By: Almira Bar M.D.   On: 07/04/2022 07:40    Assessment & Plan:  Stage 3b chronic kidney disease (HCC) -     Urinalysis, Routine w reflex microscopic; Future -     Basic metabolic panel; Future  Hyperlipidemia LDL goal <70 -     Lipid panel; Future -     TSH; Future -     Hepatic function panel; Future  LVH (left ventricular hypertrophy) due to hypertensive disease, without heart failure -     Urinalysis, Routine w reflex microscopic; Future  Vitamin D deficiency disease -     VITAMIN D 25 Hydroxy (Vit-D Deficiency, Fractures); Future  Encounter for general adult medical examination with abnormal findings  Deficiency anemia -     IBC + Ferritin; Future -     Reticulocytes; Future -     CBC with Differential/Platelet; Future -     Vitamin B12; Future -     Folate; Future -     Zinc; Future -     Vitamin B1; Future  Thrombocytopenia (HCC) -     CBC with Differential/Platelet; Future -     Vitamin B12; Future -     Folate; Future -     Zinc; Future -     Vitamin B1; Future     Follow-up: Return in about 6 months (around 04/26/2023).  Sanda Linger, MD

## 2022-10-24 NOTE — Patient Instructions (Signed)

## 2022-11-02 ENCOUNTER — Other Ambulatory Visit: Payer: Self-pay | Admitting: Internal Medicine

## 2022-11-02 ENCOUNTER — Other Ambulatory Visit (HOSPITAL_COMMUNITY): Payer: Self-pay

## 2022-11-02 DIAGNOSIS — D538 Other specified nutritional anemias: Secondary | ICD-10-CM

## 2022-11-02 MED ORDER — ZINC GLUCONATE 50 MG PO TABS
50.0000 mg | ORAL_TABLET | Freq: Every day | ORAL | 0 refills | Status: AC
Start: 2022-11-02 — End: ?
  Filled 2022-11-02: qty 90, 90d supply, fill #0
  Filled 2022-12-12: qty 100, 100d supply, fill #0

## 2022-12-12 ENCOUNTER — Other Ambulatory Visit (HOSPITAL_COMMUNITY): Payer: Self-pay

## 2022-12-12 ENCOUNTER — Other Ambulatory Visit: Payer: Self-pay | Admitting: Internal Medicine

## 2022-12-12 DIAGNOSIS — I10 Essential (primary) hypertension: Secondary | ICD-10-CM

## 2022-12-12 DIAGNOSIS — I2510 Atherosclerotic heart disease of native coronary artery without angina pectoris: Secondary | ICD-10-CM

## 2022-12-12 MED ORDER — IRBESARTAN 150 MG PO TABS
150.0000 mg | ORAL_TABLET | Freq: Every day | ORAL | 0 refills | Status: DC
Start: 2022-12-12 — End: 2023-03-29
  Filled 2022-12-12: qty 90, 90d supply, fill #0

## 2022-12-14 ENCOUNTER — Other Ambulatory Visit (HOSPITAL_COMMUNITY): Payer: Self-pay

## 2022-12-22 ENCOUNTER — Other Ambulatory Visit (HOSPITAL_COMMUNITY): Payer: Self-pay

## 2022-12-22 ENCOUNTER — Other Ambulatory Visit (HOSPITAL_BASED_OUTPATIENT_CLINIC_OR_DEPARTMENT_OTHER): Payer: Self-pay

## 2023-01-11 DIAGNOSIS — E559 Vitamin D deficiency, unspecified: Secondary | ICD-10-CM | POA: Diagnosis not present

## 2023-01-11 DIAGNOSIS — E785 Hyperlipidemia, unspecified: Secondary | ICD-10-CM | POA: Diagnosis not present

## 2023-01-11 DIAGNOSIS — I5030 Unspecified diastolic (congestive) heart failure: Secondary | ICD-10-CM | POA: Diagnosis not present

## 2023-01-11 DIAGNOSIS — D631 Anemia in chronic kidney disease: Secondary | ICD-10-CM | POA: Diagnosis not present

## 2023-01-11 DIAGNOSIS — I129 Hypertensive chronic kidney disease with stage 1 through stage 4 chronic kidney disease, or unspecified chronic kidney disease: Secondary | ICD-10-CM | POA: Diagnosis not present

## 2023-01-11 DIAGNOSIS — N1832 Chronic kidney disease, stage 3b: Secondary | ICD-10-CM | POA: Diagnosis not present

## 2023-01-11 DIAGNOSIS — N184 Chronic kidney disease, stage 4 (severe): Secondary | ICD-10-CM | POA: Diagnosis not present

## 2023-01-15 LAB — LAB REPORT - SCANNED
Albumin, Urine POC: 12.9
Albumin/Creatinine Ratio, Urine, POC: 42
Creatinine, POC: 30.6 mg/dL
EGFR: 49

## 2023-02-18 NOTE — Progress Notes (Unsigned)
Pt is a no show

## 2023-02-20 ENCOUNTER — Ambulatory Visit: Payer: Commercial Managed Care - PPO | Attending: Internal Medicine | Admitting: Internal Medicine

## 2023-02-21 ENCOUNTER — Encounter: Payer: Self-pay | Admitting: Internal Medicine

## 2023-02-27 ENCOUNTER — Other Ambulatory Visit: Payer: Self-pay | Admitting: Internal Medicine

## 2023-02-27 DIAGNOSIS — I2510 Atherosclerotic heart disease of native coronary artery without angina pectoris: Secondary | ICD-10-CM

## 2023-02-27 MED ORDER — ASPIRIN 81 MG PO TBEC
81.0000 mg | DELAYED_RELEASE_TABLET | Freq: Every day | ORAL | 1 refills | Status: AC
Start: 2023-02-27 — End: ?
  Filled 2023-02-27: qty 90, 90d supply, fill #0
  Filled 2023-05-24: qty 90, 90d supply, fill #1

## 2023-02-28 ENCOUNTER — Other Ambulatory Visit: Payer: Self-pay

## 2023-02-28 ENCOUNTER — Other Ambulatory Visit (HOSPITAL_COMMUNITY): Payer: Self-pay

## 2023-03-01 ENCOUNTER — Other Ambulatory Visit (HOSPITAL_COMMUNITY): Payer: Self-pay

## 2023-03-20 DIAGNOSIS — Z1231 Encounter for screening mammogram for malignant neoplasm of breast: Secondary | ICD-10-CM | POA: Diagnosis not present

## 2023-03-29 ENCOUNTER — Other Ambulatory Visit: Payer: Self-pay | Admitting: Internal Medicine

## 2023-03-29 ENCOUNTER — Other Ambulatory Visit (HOSPITAL_COMMUNITY): Payer: Self-pay

## 2023-03-29 DIAGNOSIS — I2510 Atherosclerotic heart disease of native coronary artery without angina pectoris: Secondary | ICD-10-CM

## 2023-03-29 DIAGNOSIS — I10 Essential (primary) hypertension: Secondary | ICD-10-CM

## 2023-03-29 MED ORDER — IRBESARTAN 150 MG PO TABS
150.0000 mg | ORAL_TABLET | Freq: Every day | ORAL | 0 refills | Status: DC
Start: 2023-03-29 — End: 2023-06-22
  Filled 2023-03-29: qty 90, 90d supply, fill #0

## 2023-04-19 DIAGNOSIS — E559 Vitamin D deficiency, unspecified: Secondary | ICD-10-CM | POA: Diagnosis not present

## 2023-04-19 DIAGNOSIS — D631 Anemia in chronic kidney disease: Secondary | ICD-10-CM | POA: Diagnosis not present

## 2023-04-19 DIAGNOSIS — N1832 Chronic kidney disease, stage 3b: Secondary | ICD-10-CM | POA: Diagnosis not present

## 2023-04-19 DIAGNOSIS — N189 Chronic kidney disease, unspecified: Secondary | ICD-10-CM | POA: Diagnosis not present

## 2023-04-19 DIAGNOSIS — I129 Hypertensive chronic kidney disease with stage 1 through stage 4 chronic kidney disease, or unspecified chronic kidney disease: Secondary | ICD-10-CM | POA: Diagnosis not present

## 2023-05-04 ENCOUNTER — Ambulatory Visit: Payer: Commercial Managed Care - PPO | Admitting: Nurse Practitioner

## 2023-05-24 ENCOUNTER — Other Ambulatory Visit: Payer: Self-pay

## 2023-05-27 NOTE — Progress Notes (Unsigned)
 Cardiology Office Note:  .   Date:  05/29/2023  ID:  [name], DOB [birth year], MRN [id number] PCP: Etta Grandchild, MD  [facility] Providers Cardiologist:  Dietrich Pates, MD    Patient Profile: .      PMH Coronary artery disease S/p CABG in 2005 (LIMA-LAD, SVG-PDA, SVG-diag) Normal myoivew in 2006 and 2022 Hypertension Hyperlipidemia Tremor Aortic valve insufficiency TTE 07/04/2022 with moderate AI Prior tobacco use 25 pack year hx, quit in 1990s CKD Stage 3a  History of coronary artery disease s/p CABG as outlined above.  She has maintained consistent follow-up with cardiology.  Last cardiology clinic visit was 07/29/2022 with Dr. Tenny Craw.  She reported on July 04, 2022 she was in the shower and got dizzy.  She passed out and was taken to the ED.  Echo revealed normal LVEF 60 to 65%, no RWMA, mild LVH, G1 DD, mildly elevated PASP, mild calcification of the aortic valve with no evidence of stenosis, moderate AI.  Following that event she decreased her amlodipine to 5 mg daily.  SBP mainly 110s to 120s.  She was feeling better at the office visit.  She was advised to bring her home BP and return in 1 month for nurse visit BP check.  At nurse visit on 08/15/2022 home BP cuff correlated with office cuff.  She reported not using appropriate technique when checking her BP.       History of Present Illness: [name] is a very pleasant 78 y.o. female who is here today for follow-up of CAD.  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  She continues to walk at least 20 minutes daily and is not having any concerning cardiac symptoms.  She reports inconsistent BP readings at home, with some readings over 140. Typically checks her blood pressure in the late afternoons. She admits to inconsistent use of her amlodipine, which she sometimes takes as a half tablet and sometimes does not take at all. She is consistent with her irbesartan. Admits her  diet could be better, with a weakness for sweets, but she does not drink soda and tries to limit fried foods and meat. She has not been diagnosed with diabetes, with her last A1c at 5.6. She denies chest pain, shortness of breath, orthopnea, PND, palpitations, presyncope, syncope. Is followed by nephrology and kidney function has been stable.  Discussed the use of AI scribe software for clinical note transcription with the patient, who gave verbal consent to proceed.   ROS: See HPI       Studies Reviewed: Marland Kitchen   EKG Interpretation Date/Time:  Monday May 29 2023 14:58:35 EDT Ventricular Rate:  61 PR Interval:  162 QRS Duration:  72 QT Interval:  406 QTC Calculation: 408 R Axis:   12  Text Interpretation: Normal sinus rhythm Normal ECG Confirmed by Eligha Bridegroom [phone]) on 05/29/2023 3:21:10 PM   No results found for: "LIPOA"   Risk Assessment/Calculations:     HYPERTENSION CONTROL Vitals:   05/29/23 1455 05/29/23 1741  BP: (!) 154/54 (!) 148/58    The patient's blood pressure is elevated above target today.  In order to address the patient's elevated BP: A current anti-hypertensive medication was adjusted today.          Physical Exam:   VS:  BP (!) 148/58   Pulse 61   Ht 5\' 4"  (1.626 m)   Wt 123 lb 12.8 oz (56.2  kg)   SpO2 97%   BMI 21.25 kg/m    Wt Readings from Last 3 Encounters:  05/29/23 123 lb 12.8 oz (56.2 kg)  10/24/22 120 lb (54.4 kg)  07/29/22 121 lb (54.9 kg)    GEN: Well nourished, well developed in no acute distress NECK: No JVD; No carotid bruits CARDIAC: RRR, soft systolic murmur. No rubs, gallops RESPIRATORY:  Clear to auscultation without rales, wheezing or rhonchi  ABDOMEN: Soft, non-tender, non-distended EXTREMITIES:  No edema; No deformity     ASSESSMENT AND PLAN: .    CAD: S/p CABG in 2005. NST 07/2020 with no ischemia. She remains active with 20 minutes of walking daily. EKG today reveals no acute abnormalities. She denies chest pain,  dyspnea, or other symptoms concerning for angina. No indication for further ischemic evaluation at this time.  No bleeding concerns.  Continue aspirin, amlodipine, irbesartan, Crestor.  Aortic insufficiency: 2D echo 07/04/2022 with moderate AI, aortic valve calcification with no evidence of stenosis, normal LVEF. She denies chest discomfort, shortness of breath, presyncope, syncope.  We discussed repeating echo for surveillance. Since she is currently asymptomatic, we will plan for repeat echo in 6 months, sooner if clinically indicated.  Advised her to notify us if she develops concerning symptoms.  Hyperlipidemia LDL goal < 55: Lipid panel completed 10/24/2022 with total cholesterol 142, triglycerides 69, HDL 68, and LDL 60.  She admits to a pension for sweets.  Encouraged dietary improvements to reach goal LDL < 55.  Continue regular exercise.  Continue Crestor.  Hypertension: BP initially elevated and remains elevated on my recheck.  She admits to noncompliance with amlodipine. She takes irbesartan consistently however. Encouraged her to take these together to increase compliance.  Given her BP monitoring guidelines and asked her to notify us if BP consistently > 130/80.         Disposition:6 months with Dr. Tenny Craw with echo prior  Signed, Eligha Bridegroom, NP-C

## 2023-05-29 ENCOUNTER — Encounter: Payer: Self-pay | Admitting: Nurse Practitioner

## 2023-05-29 ENCOUNTER — Ambulatory Visit: Payer: Commercial Managed Care - PPO | Attending: Nurse Practitioner | Admitting: Nurse Practitioner

## 2023-05-29 VITALS — BP 148/58 | HR 61 | Ht 64.0 in | Wt 123.8 lb

## 2023-05-29 DIAGNOSIS — Z951 Presence of aortocoronary bypass graft: Secondary | ICD-10-CM | POA: Diagnosis not present

## 2023-05-29 DIAGNOSIS — E785 Hyperlipidemia, unspecified: Secondary | ICD-10-CM

## 2023-05-29 DIAGNOSIS — I10 Essential (primary) hypertension: Secondary | ICD-10-CM | POA: Diagnosis not present

## 2023-05-29 DIAGNOSIS — I2510 Atherosclerotic heart disease of native coronary artery without angina pectoris: Secondary | ICD-10-CM | POA: Diagnosis not present

## 2023-05-29 DIAGNOSIS — I351 Nonrheumatic aortic (valve) insufficiency: Secondary | ICD-10-CM | POA: Diagnosis not present

## 2023-05-29 NOTE — Patient Instructions (Signed)
 Medication Instructions:   Your physician recommends that you continue on your current medications as directed. Please refer to the Current Medication list given to you today.   *If you need a refill on your cardiac medications before your next appointment, please call your pharmacy*   Lab Work:  None ordered.  If you have labs (blood work) drawn today and your tests are completely normal, you will receive your results only by: MyChart Message (if you have MyChart) OR A paper copy in the mail If you have any lab test that is abnormal or we need to change your treatment, we will call you to review the results.   Testing/Procedures:  Your physician has requested that you have an echocardiogram. Echocardiography is a painless test that uses sound waves to create images of your heart. It provides your doctor with information about the size and shape of your heart and how well your heart's chambers and valves are working. This procedure takes approximately one hour. There are no restrictions for this procedure. Please do NOT wear cologne, perfume, aftershave, or lotions (deodorant is allowed). Please arrive 15 minutes prior to your appointment time.  Please note: We ask at that you not bring children with you during ultrasound (echo/ vascular) An adult accompanying a patient to their appointment will only be allowed in the ultrasound room at the discretion of the ultrasound technician under special circumstances. We apologize for any inconvenience.    Follow-Up: At [facility], you and your health needs are our priority.  As part of our continuing mission to provide you with exceptional heart care, we have created designated Provider Care Teams.  These Care Teams include your primary Cardiologist (physician) and Advanced Practice Providers (APPs -  Physician Assistants and Nurse Practitioners) who all work together to provide you with the care you need, when you need it.  We  recommend signing up for the patient portal called "MyChart".  Sign up information is provided on this After Visit Summary.  MyChart is used to connect with patients for Virtual Visits (Telemedicine).  Patients are able to view lab/test results, encounter notes, upcoming appointments, etc.  Non-urgent messages can be sent to your provider as well.   To learn more about what you can do with MyChart, go to [email or web address].    Your next appointment:   6 month(s)  Provider:   Dietrich Pates, MD     Other Instructions  Your physician wants you to follow-up in: 6 months after echo.  You will receive a reminder letter in the mail two months in advance. If you don't receive a letter, please call our office to schedule the follow-up appointment.     1st Floor: - Lobby - Registration  - Pharmacy  - Lab - Cafe  2nd Floor: - PV Lab - Diagnostic Testing (echo, CT, nuclear med)  3rd Floor: - Vacant  4th Floor: - TCTS (cardiothoracic surgery) - AFib Clinic - Structural Heart Clinic - Vascular Surgery  - Vascular Ultrasound  5th Floor: - HeartCare Cardiology (general and EP) - Clinical Pharmacy for coumadin, hypertension, lipid, weight-loss medications, and med management appointments    Valet parking services will be available as well.    HOW TO TAKE YOUR BLOOD PRESSURE  Rest 5 minutes before taking your blood pressure. Don't  smoke or drink caffeinated beverages for at least 30 minutes before. Take your blood pressure before (not after) you eat. Sit comfortably with your back supported and both feet on the floor (  don't cross your legs). Elevate your arm to heart level on a table or a desk. Use the proper sized cuff.  It should fit smoothly and snugly around your bare upper arm.  There should be  Enough room to slip a fingertip under the cuff.  The bottom edge of the cuff should be 1 inch above the crease Of the elbow. Please monitor your blood pressure once daily 2 hours  after your am medication. If you blood pressure Consistently remains above 130 (systolic) top number or over 80 ( diastolic) bottom number X 3 days  Consecutively.  Please call our office at [phone] or send Mychart message.     ----Avoid cold medicines with D or DM at the end of them----

## 2023-06-06 ENCOUNTER — Other Ambulatory Visit (HOSPITAL_COMMUNITY): Payer: Self-pay | Admitting: Emergency Medicine

## 2023-06-06 DIAGNOSIS — N1832 Chronic kidney disease, stage 3b: Secondary | ICD-10-CM

## 2023-06-06 DIAGNOSIS — I10 Essential (primary) hypertension: Secondary | ICD-10-CM

## 2023-06-06 DIAGNOSIS — I119 Hypertensive heart disease without heart failure: Secondary | ICD-10-CM

## 2023-06-07 ENCOUNTER — Other Ambulatory Visit: Payer: Self-pay

## 2023-06-07 ENCOUNTER — Other Ambulatory Visit (HOSPITAL_COMMUNITY): Payer: Self-pay

## 2023-06-07 ENCOUNTER — Other Ambulatory Visit: Payer: Self-pay | Admitting: Internal Medicine

## 2023-06-07 DIAGNOSIS — I2510 Atherosclerotic heart disease of native coronary artery without angina pectoris: Secondary | ICD-10-CM

## 2023-06-07 MED ORDER — NITROGLYCERIN 0.4 MG SL SUBL
0.4000 mg | SUBLINGUAL_TABLET | SUBLINGUAL | 11 refills | Status: AC | PRN
  Filled 2023-06-07: qty 25, 8d supply, fill #0

## 2023-06-08 ENCOUNTER — Other Ambulatory Visit (HOSPITAL_COMMUNITY): Payer: Self-pay

## 2023-06-16 ENCOUNTER — Other Ambulatory Visit (HOSPITAL_COMMUNITY): Payer: Self-pay

## 2023-06-16 ENCOUNTER — Other Ambulatory Visit: Payer: Self-pay | Admitting: Internal Medicine

## 2023-06-16 DIAGNOSIS — I119 Hypertensive heart disease without heart failure: Secondary | ICD-10-CM

## 2023-06-16 DIAGNOSIS — I10 Essential (primary) hypertension: Secondary | ICD-10-CM

## 2023-06-16 DIAGNOSIS — N1832 Chronic kidney disease, stage 3b: Secondary | ICD-10-CM

## 2023-06-17 ENCOUNTER — Other Ambulatory Visit (HOSPITAL_COMMUNITY): Payer: Self-pay

## 2023-06-17 MED ORDER — AMLODIPINE BESYLATE 10 MG PO TABS
10.0000 mg | ORAL_TABLET | Freq: Every day | ORAL | 0 refills | Status: DC
  Filled 2023-06-17: qty 90, 90d supply, fill #0

## 2023-06-22 ENCOUNTER — Other Ambulatory Visit: Payer: Self-pay | Admitting: Internal Medicine

## 2023-06-22 ENCOUNTER — Other Ambulatory Visit (HOSPITAL_COMMUNITY): Payer: Self-pay

## 2023-06-22 ENCOUNTER — Other Ambulatory Visit: Payer: Self-pay

## 2023-06-22 DIAGNOSIS — I2510 Atherosclerotic heart disease of native coronary artery without angina pectoris: Secondary | ICD-10-CM

## 2023-06-22 DIAGNOSIS — I10 Essential (primary) hypertension: Secondary | ICD-10-CM

## 2023-06-22 MED ORDER — ROSUVASTATIN CALCIUM 40 MG PO TABS
40.0000 mg | ORAL_TABLET | Freq: Every day | ORAL | 3 refills | Status: AC
  Filled 2023-06-22: qty 90, 90d supply, fill #0
  Filled 2023-10-30: qty 90, 90d supply, fill #1
  Filled 2024-02-20: qty 90, 90d supply, fill #2

## 2023-06-23 ENCOUNTER — Other Ambulatory Visit (HOSPITAL_COMMUNITY): Payer: Self-pay

## 2023-06-23 MED ORDER — IRBESARTAN 150 MG PO TABS
150.0000 mg | ORAL_TABLET | Freq: Every day | ORAL | 0 refills | Status: DC
  Filled 2023-06-23: qty 30, 30d supply, fill #0

## 2023-07-24 ENCOUNTER — Other Ambulatory Visit (HOSPITAL_COMMUNITY): Payer: Self-pay

## 2023-07-24 ENCOUNTER — Telehealth: Payer: Self-pay | Admitting: Internal Medicine

## 2023-07-24 DIAGNOSIS — I10 Essential (primary) hypertension: Secondary | ICD-10-CM

## 2023-07-24 DIAGNOSIS — I2510 Atherosclerotic heart disease of native coronary artery without angina pectoris: Secondary | ICD-10-CM

## 2023-07-26 ENCOUNTER — Other Ambulatory Visit (HOSPITAL_COMMUNITY): Payer: Self-pay

## 2023-07-31 ENCOUNTER — Other Ambulatory Visit (HOSPITAL_COMMUNITY): Payer: Self-pay

## 2023-08-01 ENCOUNTER — Other Ambulatory Visit (HOSPITAL_COMMUNITY): Payer: Self-pay

## 2023-08-01 ENCOUNTER — Other Ambulatory Visit: Payer: Self-pay

## 2023-08-01 DIAGNOSIS — I10 Essential (primary) hypertension: Secondary | ICD-10-CM

## 2023-08-01 DIAGNOSIS — I2510 Atherosclerotic heart disease of native coronary artery without angina pectoris: Secondary | ICD-10-CM

## 2023-08-01 MED ORDER — IRBESARTAN 150 MG PO TABS
150.0000 mg | ORAL_TABLET | Freq: Every day | ORAL | 0 refills | Status: DC
  Filled 2023-08-01: qty 27, 27d supply, fill #0

## 2023-08-01 NOTE — Telephone Encounter (Signed)
 Enough medication to get her to her scheduled appointment has been sent in to her local pharmacy.

## 2023-08-01 NOTE — Telephone Encounter (Signed)
 Copied from CRM [id number]. Topic: Appointments - Appointment Scheduling >> Aug 01, 2023 12:07 PM Howard Macho wrote: Patient/patient representative is calling to schedule an appointment. Refer to attachments for appointment information.   Patient called stating she was trying to get her refill on irbesartan  but the pharmacy stated she need to make an appointment. The patient made an appointment for 6/16 but she stated she does not have enough medication to cove her until then  ---  Are we able to give pt a short supply of medication?

## 2023-08-25 ENCOUNTER — Other Ambulatory Visit: Payer: Self-pay | Admitting: Internal Medicine

## 2023-08-25 DIAGNOSIS — I119 Hypertensive heart disease without heart failure: Secondary | ICD-10-CM

## 2023-08-25 DIAGNOSIS — I10 Essential (primary) hypertension: Secondary | ICD-10-CM

## 2023-08-25 DIAGNOSIS — I2510 Atherosclerotic heart disease of native coronary artery without angina pectoris: Secondary | ICD-10-CM

## 2023-08-25 DIAGNOSIS — N1832 Chronic kidney disease, stage 3b: Secondary | ICD-10-CM

## 2023-08-28 ENCOUNTER — Ambulatory Visit: Admitting: Internal Medicine

## 2023-08-28 ENCOUNTER — Encounter: Payer: Self-pay | Admitting: Internal Medicine

## 2023-08-28 ENCOUNTER — Other Ambulatory Visit (HOSPITAL_COMMUNITY): Payer: Self-pay

## 2023-08-28 VITALS — BP 136/48 | HR 64 | Temp 98.5°F | Resp 16 | Ht 64.0 in | Wt 122.2 lb

## 2023-08-28 DIAGNOSIS — N1832 Chronic kidney disease, stage 3b: Secondary | ICD-10-CM | POA: Diagnosis not present

## 2023-08-28 DIAGNOSIS — D539 Nutritional anemia, unspecified: Secondary | ICD-10-CM

## 2023-08-28 DIAGNOSIS — I10 Essential (primary) hypertension: Secondary | ICD-10-CM | POA: Diagnosis not present

## 2023-08-28 DIAGNOSIS — D538 Other specified nutritional anemias: Secondary | ICD-10-CM

## 2023-08-28 DIAGNOSIS — E785 Hyperlipidemia, unspecified: Secondary | ICD-10-CM

## 2023-08-28 LAB — URINALYSIS, ROUTINE W REFLEX MICROSCOPIC
Bilirubin Urine: NEGATIVE
Hgb urine dipstick: NEGATIVE
Ketones, ur: NEGATIVE
Nitrite: NEGATIVE
RBC / HPF: NONE SEEN (ref 0–?)
Specific Gravity, Urine: <=1.005 — AB (ref 1.000–1.030)
Total Protein, Urine: NEGATIVE
Urine Glucose: NEGATIVE
Urobilinogen, UA: 0.2 (ref 0.0–1.0)
pH: 6 (ref 5.0–8.0)

## 2023-08-28 MED ORDER — ZINC GLUCONATE 50 MG PO TABS
50.0000 mg | ORAL_TABLET | Freq: Every day | ORAL | 0 refills | Status: DC
  Filled 2023-08-28: qty 100, 100d supply, fill #0
  Filled 2023-08-28: qty 90, 90d supply, fill #0
  Filled 2023-11-25: qty 10, 10d supply, fill #1

## 2023-08-28 NOTE — Patient Instructions (Signed)
 Chronic Kidney Disease in Adults: What to Know Chronic kidney disease (CKD) is when lasting damage happens to the kidneys slowly over time. The kidneys are two organs that do many important things in the body. These include: Taking waste and extra fluid out of the blood to make pee (urine). Making hormones. Keeping the right amount of fluids and chemicals in the body. A small amount of kidney damage may not cause problems. You must take steps to help keep the kidney damage from getting worse. A lot of damage may cause kidney failure. Kidney failure means the kidneys can no longer work right. What are the causes? Diabetes. High blood pressure. Diseases that affect the heart and blood vessels. Other kidney diseases. Diseases that affect the body's defense system (immune system). A problem with the flow of pee. This may be caused by: Kidney stones. Cancer. An enlarged prostate, in males. A kidney infection or urinary tract infection (UTI) that keeps coming back. What increases the risk? Getting older. The chances of having CKD increase with age. A family history of kidney disease or kidney failure. Having a disease caused by genes. Taking medicines that can harm the kidneys. Being near or having contact with harmful substances. Being very overweight. Using tobacco now or in the past. What are the signs or symptoms? Common symptoms of CKD include: Feeling very tired and having less energy. Swelling of the face, legs, ankles, or feet. Throwing up or feeling like you may throw up. Not wanting to eat as much as normal. Being confused or not able to focus. Twitches and cramps in the leg muscles or other muscles. Dry, itchy skin. Other symptoms may include: Shortness of breath. Trouble sleeping. Making less pee, or making more pee, especially at night. A taste of metal in your mouth. You may also become anemic. Anemia means there's not enough red blood cells in your blood. You may get  symptoms slowly. You may not notice them until the kidney damage gets very bad. How is this diagnosed? CKD may be diagnosed based on: Tests on your blood or pee. Imaging tests, like an ultrasound or a CT scan. A kidney biopsy. For this test, a sample of kidney tissue is removed to be looked at under a microscope. These tests will help to find out how serious the CKD is. How is this treated? Often, there's no cure for CKD. Treatment can help with symptoms and help keep the disease from getting worse. Treatment may include: Treating other problems that are causing your CKD or making it worse. Diet changes. You may need to: Avoid alcohol. Avoid foods that are high in salt, potassium, phosphorous, and protein. Taking medicines for symptoms and to help control other conditions. Dialysis. This treatment gets harmful waste out of your body. It may be needed if you have kidney failure. Follow these instructions at home: Medicines Take your medicines only as told. The amount of some medicines you take may need to be changed. Do not take any new medicines, vitamins, or supplements unless your health care provider says it's okay. These may make kidney damage worse. Lifestyle Do not smoke, vape, or use nicotine or tobacco. If you drink alcohol: Limit how much you have to: 0-1 drink a day if you're female. 0-2 drinks a day if you're female. Know how much alcohol is in your drink. In the [place] one drink is one 12 oz bottle of beer (355 mL), one 5 oz glass of wine (148 mL), or one 1 oz  glass of hard liquor (44 mL). Stay at a healthy weight. If you need help, ask your provider. General instructions  Eat and drink as told. Track your blood pressure at home. Tell your provider about any changes. If you have diabetes, track your blood sugar as told. Exercise at least 30 minutes a day, 5 days a week. Keep your shots (vaccinations) up to date. Keep all follow-up visits. Your provider may need to change  your treatments over time. Where to find support American Kidney Fund: [place].com.au Kidney School: kidneyschool.org American Association of Kidney Patients: https://www.miller-montoya.com/ Where to find more information National Kidney Foundation: kidney.org Centers for Disease Control and Prevention. To learn more: Go to DiningCalendar.de. Click "Search". Type "chronic kidney disease" in the search box. Contact a health care provider if: You have new symptoms. You get symptoms of end-stage kidney disease. These include: Headaches. Numbness in your hands or feet. Leg cramps. Easy bruising. Get help right away if: You have a fever. You make less pee than usual. You have pain or bleeding when you pee or poop. You have chest pain. You have shortness of breath. These symptoms may be an emergency. Call 911 right away. Do not wait to see if the symptoms will go away. Do not drive yourself to the hospital. This information is not intended to replace advice given to you by your health care provider. Make sure you discuss any questions you have with your health care provider. Document Revised: 01/10/2023 Document Reviewed: 09/02/2022 Elsevier Patient Education  2024 [employer].

## 2023-08-28 NOTE — Progress Notes (Signed)
 Subjective:  Patient ID: [name], female    DOB: Jan 29, 1946  Age: 78 y.o. MRN: [id number]  CC: Hypertension, Anemia, and Hyperlipidemia   HPI [name] presents for f/up -----  Discussed the use of AI scribe software for clinical note transcription with the patient, who gave verbal consent to proceed.  History of Present Illness   [name] is a 78 year old female who presents for a routine follow-up visit.  She feels generally well with no chest pain, shortness of breath, dizziness, or lightheadedness. However, she notes a slight increase in tremors, describing it as feeling 'nervous', though it does not impact her activity level.  She experiences cramps but has no swelling in her legs or feet.       Outpatient Medications Prior to Visit  Medication Sig Dispense Refill   cholecalciferol  (VITAMIN D3) 25 MCG (1000 UNIT) tablet Take 1,000 Units by mouth daily.     nitroGLYCERIN  (NITROSTAT ) 0.4 MG SL tablet Place 1 tablet under the tongue every 5 minutes as needed for chest pain (3 DOSES then CALL 911). 25 tablet 11   rosuvastatin  (CRESTOR ) 40 MG tablet Take 1 tablet (40 mg total) by mouth daily. 90 tablet 3   aspirin  EC 81 MG tablet Take 1 tablet (81 mg total) by mouth daily (swallow whole) 90 tablet 1   irbesartan  (AVAPRO ) 150 MG tablet Take 1 tablet (150 mg total) by mouth daily. Please keep upcoming appointment for further refills. 27 tablet 0   zinc  gluconate 50 MG tablet Take 1 tablet (50 mg total) by mouth daily. 100 tablet 0   amLODipine  (NORVASC ) 10 MG tablet Take 0.5 tablets (5 mg total) by mouth daily. 30 tablet 0   amLODipine  (NORVASC ) 10 MG tablet Take 1 tablet (10 mg total) by mouth daily. 90 tablet 0   No facility-administered medications prior to visit.    ROS Review of Systems  Constitutional:  Negative for appetite change, chills, diaphoresis, fatigue and fever.  Eyes: Negative.  Negative for visual disturbance.  Respiratory: Negative.   Negative for cough, chest tightness, shortness of breath and wheezing.   Cardiovascular:  Negative for chest pain, palpitations and leg swelling.  Gastrointestinal: Negative.  Negative for abdominal pain, constipation, diarrhea, nausea and vomiting.  Genitourinary:  Negative for difficulty urinating.  Musculoskeletal: Negative.  Negative for arthralgias and myalgias.  Skin: Negative.   Neurological:  Negative for dizziness, weakness and light-headedness.  Hematological:  Negative for adenopathy. Does not bruise/bleed easily.  Psychiatric/Behavioral: Negative.      Objective:  BP (!) 136/48 (BP Location: Left Arm, Patient Position: Sitting, Cuff Size: Normal)   Pulse 64   Temp 98.5 F (36.9 C) (Oral)   Resp 16   Ht 5' 4 (1.626 m)   Wt 122 lb 3.2 oz (55.4 kg)   SpO2 98%   BMI 20.98 kg/m   BP Readings from Last 3 Encounters:  08/28/23 (!) 136/48  05/29/23 (!) 148/58  10/24/22 (!) 142/56    Wt Readings from Last 3 Encounters:  08/28/23 122 lb 3.2 oz (55.4 kg)  05/29/23 123 lb 12.8 oz (56.2 kg)  10/24/22 120 lb (54.4 kg)    Physical Exam Vitals reviewed.  HENT:     Nose: Nose normal.     Mouth/Throat:     Mouth: Mucous membranes are moist.   Eyes:     General: No scleral icterus.    Conjunctiva/sclera: Conjunctivae normal.    Cardiovascular:  Rate and Rhythm: Normal rate and regular rhythm.     Heart sounds: No murmur heard.    No friction rub. No gallop.  Pulmonary:     Effort: Pulmonary effort is normal.     Breath sounds: No stridor. No wheezing, rhonchi or rales.  Abdominal:     General: Abdomen is flat.     Palpations: There is no mass.     Tenderness: There is no abdominal tenderness. There is no guarding.     Hernia: No hernia is present.   Musculoskeletal:        General: Normal range of motion.     Cervical back: Neck supple.     Right lower leg: No edema.     Left lower leg: No edema.  Lymphadenopathy:     Cervical: No cervical adenopathy.    Skin:    General: Skin is warm and dry.   Neurological:     General: No focal deficit present.     Mental Status: Mental status is at baseline.   Psychiatric:        Mood and Affect: Mood normal.        Behavior: Behavior normal.     Lab Results  Component Value Date   WBC 8.7 08/28/2023   HGB 11.0 (L) 08/28/2023   HCT 33.3 (L) 08/28/2023   PLT 188 08/28/2023   GLUCOSE 91 08/28/2023   CHOL 135 08/28/2023   TRIG 123 08/28/2023   HDL 72 08/28/2023   LDLCALC 43 08/28/2023   ALT 18 08/28/2023   AST 20 08/28/2023   NA 137 08/28/2023   K 4.3 08/28/2023   CL 103 08/28/2023   CREATININE 1.29 (H) 08/28/2023   BUN 18 08/28/2023   CO2 22 08/28/2023   TSH 2.36 08/28/2023   HGBA1C 5.6 07/29/2022    ECHOCARDIOGRAM COMPLETE Result Date: 07/04/2022    ECHOCARDIOGRAM REPORT   Patient Name:   [name] Date of Exam: 07/04/2022 Medical Rec #:  [id number]       Height:       64.0 in Accession #:    [id number]      Weight:       127.0 lb Date of Birth:  Apr 09, 1945       BSA:          1.613 m Patient Age:    76 years        BP:           103/88 mmHg Patient Gender: F               HR:           58 bpm. Exam Location:  Inpatient Procedure: 2D Echo, Cardiac Doppler and Color Doppler Indications:    Murmur  History:        Patient has no prior history of Echocardiogram examinations.                 Risk Factors:Dyslipidemia and Hypertension.  Sonographer:    Molly Halloran Referring Phys: [phone] JULIE HAVILAND IMPRESSIONS  1. Left ventricular ejection fraction, by estimation, is 60 to 65%. The left ventricle has normal function. The left ventricle has no regional wall motion abnormalities. There is mild concentric left ventricular hypertrophy. Left ventricular diastolic parameters are consistent with Grade I diastolic dysfunction (impaired relaxation).  2. Right ventricular systolic function is normal. The right ventricular size is normal. There is mildly elevated pulmonary artery systolic  pressure. The estimated right ventricular systolic pressure  is 34.4 mmHg.  3. The mitral valve is normal in structure. Trivial mitral valve regurgitation. No evidence of mitral stenosis.  4. The aortic valve is tricuspid. There is mild calcification of the aortic valve. Aortic valve regurgitation is moderate. Aortic valve sclerosis/calcification is present, without any evidence of aortic stenosis. Aortic regurgitation PHT measures 347 msec. Aortic valve mean gradient measures 4.5 mmHg. Aortic valve Vmax measures 1.48 m/s.  5. Aortic dilatation noted. There is borderline dilatation of the ascending aorta, measuring 37 mm.  6. The inferior vena cava is normal in size with greater than 50% respiratory variability, suggesting right atrial pressure of 3 mmHg. FINDINGS  Left Ventricle: Left ventricular ejection fraction, by estimation, is 60 to 65%. The left ventricle has normal function. The left ventricle has no regional wall motion abnormalities. The left ventricular internal cavity size was normal in size. There is  mild concentric left ventricular hypertrophy. Left ventricular diastolic parameters are consistent with Grade I diastolic dysfunction (impaired relaxation). Right Ventricle: The right ventricular size is normal. No increase in right ventricular wall thickness. Right ventricular systolic function is normal. There is mildly elevated pulmonary artery systolic pressure. The tricuspid regurgitant velocity is 2.71  m/s, and with an assumed right atrial pressure of 5 mmHg, the estimated right ventricular systolic pressure is 34.4 mmHg. Left Atrium: Left atrial size was normal in size. Right Atrium: Right atrial size was normal in size. Pericardium: There is no evidence of pericardial effusion. Mitral Valve: The mitral valve is normal in structure. Trivial mitral valve regurgitation. No evidence of mitral valve stenosis. MV peak gradient, 5.6 mmHg. The mean mitral valve gradient is 2.0 mmHg. Tricuspid Valve: The  tricuspid valve is normal in structure. Tricuspid valve regurgitation is mild . No evidence of tricuspid stenosis. Aortic Valve: The aortic valve is tricuspid. There is mild calcification of the aortic valve. Aortic valve regurgitation is moderate. Aortic regurgitation PHT measures 347 msec. Aortic valve sclerosis/calcification is present, without any evidence of aortic stenosis. Aortic valve mean gradient measures 4.5 mmHg. Aortic valve peak gradient measures 8.8 mmHg. Aortic valve area, by VTI measures 2.34 cm. Pulmonic Valve: The pulmonic valve was normal in structure. Pulmonic valve regurgitation is not visualized. No evidence of pulmonic stenosis. Aorta: The aortic root is normal in size and structure and aortic dilatation noted. There is borderline dilatation of the ascending aorta, measuring 37 mm. Venous: The inferior vena cava is normal in size with greater than 50% respiratory variability, suggesting right atrial pressure of 3 mmHg. IAS/Shunts: No atrial level shunt detected by color flow Doppler.  LEFT VENTRICLE PLAX 2D LVIDd:         4.30 cm     Diastology LVIDs:         2.80 cm     LV e' medial:    6.73 cm/s LV PW:         0.80 cm     LV E/e' medial:  12.9 LV IVS:        1.10 cm     LV e' lateral:   9.75 cm/s LVOT diam:     1.90 cm     LV E/e' lateral: 8.9 LV SV:         88 LV SV Index:   55 LVOT Area:     2.84 cm  LV Volumes (MOD) LV vol d, MOD A2C: 64.0 ml LV vol d, MOD A4C: 52.4 ml LV vol s, MOD A2C: 22.1 ml LV vol s, MOD A4C: 19.5  ml LV SV MOD A2C:     41.9 ml LV SV MOD A4C:     52.4 ml LV SV MOD BP:      36.9 ml RIGHT VENTRICLE             IVC RV Basal diam:  3.30 cm     IVC diam: 1.80 cm RV S prime:     12.20 cm/s TAPSE (M-mode): 1.9 cm LEFT ATRIUM             Index        RIGHT ATRIUM          Index LA diam:        3.50 cm 2.17 cm/m   RA Area:     9.43 cm LA Vol (A2C):   46.2 ml 28.64 ml/m  RA Volume:   16.40 ml 10.17 ml/m LA Vol (A4C):   33.6 ml 20.83 ml/m LA Biplane Vol: 39.5 ml 24.49  ml/m  AORTIC VALVE AV Area (Vmax):    2.30 cm AV Area (Vmean):   2.18 cm AV Area (VTI):     2.34 cm AV Vmax:           148.00 cm/s AV Vmean:          101.100 cm/s AV VTI:            0.376 m AV Peak Grad:      8.8 mmHg AV Mean Grad:      4.5 mmHg LVOT Vmax:         120.00 cm/s LVOT Vmean:        77.800 cm/s LVOT VTI:          0.310 m LVOT/AV VTI ratio: 0.83 AI PHT:            347 msec AR Vena Contracta: 0.60 cm  AORTA Ao Root diam: 3.00 cm Ao Asc diam:  3.70 cm MITRAL VALVE                TRICUSPID VALVE MV Area (PHT): 4.68 cm     TR Peak grad:   29.4 mmHg MV Area VTI:   2.41 cm     TR Vmax:        271.00 cm/s MV Peak grad:  5.6 mmHg MV Mean grad:  2.0 mmHg     SHUNTS MV Vmax:       1.18 m/s     Systemic VTI:  0.31 m MV Vmean:      65.3 cm/s    Systemic Diam: 1.90 cm MV Decel Time: 162 msec MV E velocity: 86.70 cm/s MV A velocity: 109.00 cm/s MV E/A ratio:  0.80 Toribio Fuel MD Electronically signed by Toribio Fuel MD Signature Date/Time: 07/04/2022/10:42:10 AM    Final    DG Chest Port 1 View Result Date: 07/04/2022 CLINICAL DATA:  Syncopal episode. EXAM: PORTABLE CHEST 1 VIEW COMPARISON:  PA and lateral 09/29/2003 FINDINGS: The heart size and mediastinal contours are within normal limits. There are old CABG changes and calcifications in the transverse aorta. Both lungs are clear. The visualized skeletal structures are unremarkable. IMPRESSION: No evidence of acute chest disease. Electronically Signed   By: Francis Quam M.D.   On: 07/04/2022 07:40    Assessment & Plan:  Stage 3b chronic kidney disease (HCC)- Will start an SGLT-2 inh. -     Urinalysis, Routine w reflex microscopic; Future -     Basic metabolic panel with GFR; Future -     Empagliflozin ;  Take 1 tablet (10 mg total) by mouth daily before breakfast.  Dispense: 90 tablet; Refill: 1  Hyperlipidemia LDL goal <70- LDL goal achieved. Doing well on the statin  -     Lipid panel; Future -     TSH; Future -     Hepatic function  panel; Future  Anemia due to zinc  deficiency -     CBC with Differential/Platelet; Future -     Zinc  Gluconate; Take 1 tablet (50 mg total) by mouth daily.  Dispense: 100 tablet; Refill: 0  Essential hypertension- BP is over-controlled. Will discontinue the ARB. -     TSH; Future -     Basic metabolic panel with GFR; Future  Deficiency anemia -     IBC + Ferritin; Future -     Vitamin B12; Future -     Reticulocytes; Future -     Folate; Future -     Vitamin B1; Future     Follow-up: Return in about 3 months (around 11/28/2023).  Debby Molt, MD

## 2023-08-28 NOTE — Telephone Encounter (Signed)
 Pt medication is duplicated. Please address. Thank you

## 2023-08-29 ENCOUNTER — Other Ambulatory Visit (HOSPITAL_COMMUNITY): Payer: Self-pay

## 2023-08-29 ENCOUNTER — Ambulatory Visit: Payer: Self-pay | Admitting: Internal Medicine

## 2023-08-29 DIAGNOSIS — D539 Nutritional anemia, unspecified: Secondary | ICD-10-CM | POA: Insufficient documentation

## 2023-08-29 LAB — HEPATIC FUNCTION PANEL
AG Ratio: 2.1 (calc) (ref 1.0–2.5)
ALT: 18 U/L (ref 6–29)
AST: 20 U/L (ref 10–35)
Albumin: 4.2 g/dL (ref 3.6–5.1)
Alkaline phosphatase (APISO): 69 U/L (ref 37–153)
Bilirubin, Direct: 0.2 mg/dL (ref 0.0–0.2)
Globulin: 2 g/dL (ref 1.9–3.7)
Indirect Bilirubin: 0.6 mg/dL (ref 0.2–1.2)
Total Bilirubin: 0.8 mg/dL (ref 0.2–1.2)
Total Protein: 6.2 g/dL (ref 6.1–8.1)

## 2023-08-29 LAB — LIPID PANEL
Cholesterol: 135 mg/dL (ref <200)
HDL: 72 mg/dL (ref >=50)
LDL Cholesterol (Calc): 43 mg/dL
Non-HDL Cholesterol (Calc): 63 mg/dL (ref <130)
Total CHOL/HDL Ratio: 1.9 (calc) (ref <5.0)
Triglycerides: 123 mg/dL (ref <150)

## 2023-08-29 LAB — BASIC METABOLIC PANEL WITH GFR
BUN/Creatinine Ratio: 14 (calc) (ref 6–22)
BUN: 18 mg/dL (ref 7–25)
CO2: 22 mmol/L (ref 20–32)
Calcium: 9.3 mg/dL (ref 8.6–10.4)
Chloride: 103 mmol/L (ref 98–110)
Creat: 1.29 mg/dL — ABNORMAL HIGH (ref 0.60–1.00)
Glucose, Bld: 91 mg/dL (ref 65–99)
Potassium: 4.3 mmol/L (ref 3.5–5.3)
Sodium: 137 mmol/L (ref 135–146)
eGFR: 43 mL/min/{1.73_m2} — ABNORMAL LOW (ref >=60)

## 2023-08-29 LAB — CBC WITH DIFFERENTIAL/PLATELET
Absolute Lymphocytes: 1749 {cells}/uL (ref 850–3900)
Absolute Monocytes: 557 {cells}/uL (ref 200–950)
Basophils Absolute: 78 {cells}/uL (ref 0–200)
Basophils Relative: 0.9 %
Eosinophils Absolute: 505 {cells}/uL — ABNORMAL HIGH (ref 15–500)
Eosinophils Relative: 5.8 %
HCT: 33.3 % — ABNORMAL LOW (ref 35.0–45.0)
Hemoglobin: 11 g/dL — ABNORMAL LOW (ref 11.7–15.5)
MCH: 32.1 pg (ref 27.0–33.0)
MCHC: 33 g/dL (ref 32.0–36.0)
MCV: 97.1 fL (ref 80.0–100.0)
MPV: 12.4 fL (ref 7.5–12.5)
Monocytes Relative: 6.4 %
Neutro Abs: 5812 {cells}/uL (ref 1500–7800)
Neutrophils Relative %: 66.8 %
Platelets: 188 10*3/uL (ref 140–400)
RBC: 3.43 10*6/uL — ABNORMAL LOW (ref 3.80–5.10)
RDW: 12.7 % (ref 11.0–15.0)
Total Lymphocyte: 20.1 %
WBC: 8.7 10*3/uL (ref 3.8–10.8)

## 2023-08-29 LAB — TSH: TSH: 2.36 m[IU]/L (ref 0.40–4.50)

## 2023-08-29 MED ORDER — ASPIRIN 81 MG PO TBEC
81.0000 mg | DELAYED_RELEASE_TABLET | Freq: Every day | ORAL | 1 refills | Status: DC
  Filled 2023-08-29: qty 90, 90d supply, fill #0
  Filled 2023-11-25: qty 90, 90d supply, fill #1

## 2023-08-30 ENCOUNTER — Telehealth: Payer: Self-pay | Admitting: Internal Medicine

## 2023-08-30 ENCOUNTER — Other Ambulatory Visit (INDEPENDENT_AMBULATORY_CARE_PROVIDER_SITE_OTHER)

## 2023-08-30 DIAGNOSIS — D539 Nutritional anemia, unspecified: Secondary | ICD-10-CM | POA: Diagnosis not present

## 2023-08-30 LAB — IBC + FERRITIN
Ferritin: 179.5 ng/mL (ref 10.0–291.0)
Iron: 82 ug/dL (ref 42–145)
Saturation Ratios: 27.9 % (ref 20.0–50.0)
TIBC: 294 ug/dL (ref 250.0–450.0)
Transferrin: 210 mg/dL — ABNORMAL LOW (ref 212.0–360.0)

## 2023-08-30 LAB — VITAMIN B12: Vitamin B-12: 227 pg/mL (ref 211–911)

## 2023-08-30 LAB — FOLATE: Folate: 20.1 ng/mL (ref >5.9)

## 2023-08-30 NOTE — Telephone Encounter (Incomplete)
 Prescription Request  08/30/2023  LOV: 08/28/2023  What is the name of the medication or equipment? irbesartan   Have you contacted your pharmacy to request a refill? Yes   Which pharmacy would you like this sent to?  [place] [facility] [address] [place] [address] Phone: [phone] Fax: [phone]   Patient notified that their request is being sent to the clinical staff for review and that they should receive a response within 2 business days.   Please advise at [facility] [phone]

## 2023-08-30 NOTE — Telephone Encounter (Signed)
 Yes discontued

## 2023-08-30 NOTE — Telephone Encounter (Signed)
 Medication was D/C correct? Not on med list

## 2023-08-31 ENCOUNTER — Other Ambulatory Visit (HOSPITAL_COMMUNITY): Payer: Self-pay

## 2023-08-31 ENCOUNTER — Encounter: Payer: Self-pay | Admitting: Internal Medicine

## 2023-08-31 MED ORDER — AMLODIPINE BESYLATE 10 MG PO TABS
10.0000 mg | ORAL_TABLET | Freq: Every day | ORAL | 1 refills | Status: AC
  Filled 2023-08-31: qty 90, 90d supply, fill #0

## 2023-08-31 MED ORDER — EMPAGLIFLOZIN 10 MG PO TABS
10.0000 mg | ORAL_TABLET | Freq: Every day | ORAL | 1 refills | Status: DC
  Filled 2023-08-31: qty 90, 90d supply, fill #0
  Filled 2023-11-25: qty 30, 30d supply, fill #1
  Filled 2024-01-15: qty 30, 30d supply, fill #2
  Filled 2024-02-20: qty 30, 30d supply, fill #3

## 2023-08-31 NOTE — Telephone Encounter (Signed)
 Per PCP medication was D/C. No refill

## 2023-09-01 ENCOUNTER — Other Ambulatory Visit (HOSPITAL_COMMUNITY): Payer: Self-pay

## 2023-09-02 LAB — RETICULOCYTES
ABS Retic: 50550 {cells}/uL (ref 20000–80000)
Retic Ct Pct: 1.5 %

## 2023-09-02 LAB — VITAMIN B1: Vitamin B1 (Thiamine): 18 nmol/L (ref 8–30)

## 2023-09-03 ENCOUNTER — Other Ambulatory Visit: Payer: Self-pay | Admitting: Internal Medicine

## 2023-09-03 DIAGNOSIS — D513 Other dietary vitamin B12 deficiency anemia: Secondary | ICD-10-CM | POA: Insufficient documentation

## 2023-09-03 MED ORDER — CYANOCOBALAMIN 2000 MCG PO TABS
2000.0000 ug | ORAL_TABLET | Freq: Every day | ORAL | 0 refills | Status: AC
  Filled 2023-09-03: qty 90, 90d supply, fill #0

## 2023-09-04 ENCOUNTER — Other Ambulatory Visit (HOSPITAL_COMMUNITY): Payer: Self-pay

## 2023-09-05 ENCOUNTER — Other Ambulatory Visit (HOSPITAL_COMMUNITY): Payer: Self-pay

## 2023-10-10 DIAGNOSIS — N1832 Chronic kidney disease, stage 3b: Secondary | ICD-10-CM | POA: Diagnosis not present

## 2023-10-31 ENCOUNTER — Other Ambulatory Visit (HOSPITAL_COMMUNITY): Payer: Self-pay

## 2023-10-31 DIAGNOSIS — N1832 Chronic kidney disease, stage 3b: Secondary | ICD-10-CM | POA: Diagnosis not present

## 2023-10-31 DIAGNOSIS — E559 Vitamin D deficiency, unspecified: Secondary | ICD-10-CM | POA: Diagnosis not present

## 2023-10-31 DIAGNOSIS — I129 Hypertensive chronic kidney disease with stage 1 through stage 4 chronic kidney disease, or unspecified chronic kidney disease: Secondary | ICD-10-CM | POA: Diagnosis not present

## 2023-10-31 DIAGNOSIS — R809 Proteinuria, unspecified: Secondary | ICD-10-CM | POA: Diagnosis not present

## 2023-10-31 DIAGNOSIS — D631 Anemia in chronic kidney disease: Secondary | ICD-10-CM | POA: Diagnosis not present

## 2023-10-31 MED ORDER — LOSARTAN POTASSIUM 25 MG PO TABS
25.0000 mg | ORAL_TABLET | Freq: Every day | ORAL | 5 refills | Status: DC
  Filled 2023-10-31: qty 30, 30d supply, fill #0
  Filled 2023-11-25: qty 30, 30d supply, fill #1
  Filled 2023-12-28: qty 30, 30d supply, fill #2
  Filled 2024-02-03: qty 30, 30d supply, fill #3

## 2023-11-22 ENCOUNTER — Ambulatory Visit (HOSPITAL_COMMUNITY)
Admission: RE | Admit: 2023-11-22 | Discharge: 2023-11-22 | Disposition: A | Discharge status: Home / Self Care | Payer: Self-pay | Source: Ambulatory Visit | Attending: Cardiovascular Disease | Admitting: Cardiovascular Disease

## 2023-11-22 DIAGNOSIS — I351 Nonrheumatic aortic (valve) insufficiency: Secondary | ICD-10-CM | POA: Insufficient documentation

## 2023-11-22 DIAGNOSIS — I10 Essential (primary) hypertension: Secondary | ICD-10-CM | POA: Insufficient documentation

## 2023-11-22 LAB — ECHOCARDIOGRAM COMPLETE
Area-P 1/2: 2.72 cm2
P 1/2 time: 379 ms
S' Lateral: 2.5 cm

## 2023-11-23 ENCOUNTER — Ambulatory Visit: Payer: Self-pay | Admitting: Nurse Practitioner

## 2023-11-25 ENCOUNTER — Other Ambulatory Visit (HOSPITAL_COMMUNITY): Payer: Self-pay

## 2023-11-26 ENCOUNTER — Other Ambulatory Visit (HOSPITAL_COMMUNITY): Payer: Self-pay

## 2023-11-27 ENCOUNTER — Other Ambulatory Visit (HOSPITAL_COMMUNITY): Payer: Self-pay

## 2023-11-29 ENCOUNTER — Ambulatory Visit

## 2023-11-30 ENCOUNTER — Ambulatory Visit: Admitting: Radiology

## 2023-11-30 DIAGNOSIS — Z23 Encounter for immunization: Secondary | ICD-10-CM | POA: Diagnosis not present

## 2023-11-30 NOTE — Progress Notes (Signed)
 Patient here for HD flu vaccine. Patient tolerated well with no complications

## 2023-12-17 ENCOUNTER — Other Ambulatory Visit: Payer: Self-pay | Admitting: Internal Medicine

## 2023-12-17 DIAGNOSIS — D538 Other specified nutritional anemias: Secondary | ICD-10-CM

## 2023-12-19 ENCOUNTER — Other Ambulatory Visit (HOSPITAL_COMMUNITY): Payer: Self-pay

## 2023-12-19 MED ORDER — ZINC GLUCONATE 50 MG PO TABS
50.0000 mg | ORAL_TABLET | Freq: Every day | ORAL | 0 refills | Status: AC
  Filled 2023-12-19 – 2024-01-01 (×2): qty 100, 100d supply, fill #0

## 2023-12-20 ENCOUNTER — Other Ambulatory Visit (HOSPITAL_COMMUNITY): Payer: Self-pay

## 2023-12-27 ENCOUNTER — Other Ambulatory Visit (HOSPITAL_COMMUNITY): Payer: Self-pay

## 2023-12-30 ENCOUNTER — Other Ambulatory Visit (HOSPITAL_COMMUNITY): Payer: Self-pay

## 2024-01-01 ENCOUNTER — Other Ambulatory Visit (HOSPITAL_COMMUNITY): Payer: Self-pay

## 2024-02-03 ENCOUNTER — Other Ambulatory Visit: Payer: Self-pay | Admitting: Internal Medicine

## 2024-02-03 DIAGNOSIS — I2510 Atherosclerotic heart disease of native coronary artery without angina pectoris: Secondary | ICD-10-CM

## 2024-02-04 NOTE — Progress Notes (Unsigned)
 Cardiology Office Note   Date:  02/04/2024   ID:  [name], [name] Feb 18, 1946, MRN [id number]  PCP:  Joshua Debby CROME, MD  Cardiologist:   Vina Gull, MD   F/U of CAD     History of Present Illness: [name] is a 78 y.o. female with a history ofCAD s/p CABG in 2005 (LIMA to LAD; SVG to PDA; SVG to Diag; normal myovuiew in 2006 and 2022), hypertension, hyperlipidemia and prior tobacco abuse (25 pack year hx, quit in 1990s)  I saw the pt in May 2022  Since seen she says she says she feels good   Denies CP  NO SOB  Walks about 20 min per day    No  change in pace    I last saw the pt in clinic in  October 2023    On July 04, 2022   was in shower  Got dizzy   Then passed out  Went to ER    still felt dizz  Rx Zofran  and NS 500 cc  Since then she has cut back on her amldopine to 5 mg   BP checks at home   SBP mainly in 110s/120s systolic    The pt says she feels fine  Not dizzy  Breathing is OK  No CP  She admits to liking warm showers     I saw the pt in 2024  She was seen by CHRISTELLA Bane in march 2025  No outpatient medications have been marked as taking for the 02/05/24 encounter (Appointment) with Gull Vina GAILS, MD.     Allergies:   Patient has no known allergies.   Past Medical History:  Diagnosis Date   CAD (coronary artery disease)    Cervical cancer (HCC)    Dr Austin   Hyperlipidemia    Hypertension    Tobacco abuse     Past Surgical History:  Procedure Laterality Date   CORONARY ARTERY BYPASS GRAFT       Social History:  The patient  reports that she quit smoking about 38 years ago. Her smoking use included cigarettes. She has never used smokeless tobacco. She reports that she does not drink alcohol and does not use drugs.   Family History:  The patient's family history includes Coronary artery disease in an other family member; Diabetes in an other family member; Heart attack in her father; Hypertension in her mother and another family member.     ROS:  Please see the history of present illness. All other systems are reviewed and  Negative to the above problem except as noted.    PHYSICAL EXAM: VS:  There were no vitals taken for this visit.   BP 150/70      GEN: Well nourished, well developed, in no acute distress  HEENT: normal  Neck: no JVD, carotid bruits Cardiac: RRR;  I-II/VI systoli murmur base  No diastolic murmurs  No  LE edema  Respiratory:  clear to auscultation bilaterally GI: soft, nontender,  No hepatomegaly   EKG:  EKG is not ordered today.  Echo   06/2022  1. Left ventricular ejection fraction, by estimation, is 60 to 65%. The  left ventricle has normal function. The left ventricle has no regional  wall motion abnormalities. There is mild concentric left ventricular  hypertrophy. Left ventricular diastolic  parameters are consistent with Grade I diastolic dysfunction (impaired  relaxation).   2. Right ventricular systolic function is normal. The right ventricular  size  is normal. There is mildly elevated pulmonary artery systolic  pressure. The estimated right ventricular systolic pressure is 34.4 mmHg.   3. The mitral valve is normal in structure. Trivial mitral valve  regurgitation. No evidence of mitral stenosis.   4. The aortic valve is tricuspid. There is mild calcification of the  aortic valve. Aortic valve regurgitation is moderate. Aortic valve  sclerosis/calcification is present, without any evidence of aortic  stenosis. Aortic regurgitation PHT measures 347  msec. Aortic valve mean gradient measures 4.5 mmHg. Aortic valve Vmax  measures 1.48 m/s.   5. Aortic dilatation noted. There is borderline dilatation of the  ascending aorta, measuring 37 mm.   6. The inferior vena cava is normal in size with greater than 50%  respiratory variability, suggesting right atrial pressure of 3 mmHg.   Lipid Panel    Component Value Date/Time   CHOL 135 08/28/2023 1505   CHOL 149 11/12/2020 0754    TRIG 123 08/28/2023 1505   HDL 72 08/28/2023 1505   HDL 73 11/12/2020 0754   CHOLHDL 1.9 08/28/2023 1505   VLDL 13.8 10/24/2022 1547   LDLCALC 43 08/28/2023 1505      Wt Readings from Last 3 Encounters:  08/28/23 122 lb 3.2 oz (55.4 kg)  05/29/23 123 lb 12.8 oz (56.2 kg)  10/24/22 120 lb (54.4 kg)      ASSESSMENT AND PLAN:  1  Syncope   ONe episode   Sounds orthostatic  After  hot shower  seen in ER     Since then BP has been very good   No dizziness   Today BP high  Strange  Recomm Follow   Avoid overheating   Stay hdrated     2  HTN    BP high today  Not at other times  Come back to clinc with cuff to make sure accurate   3   CAD  Pt with remote CABG    2005 .    Myoview  in 2022 was normal  Pt without CP  Breathing is OK   4  AV dz   I have reviewed echo with pt    AI is moderate, not severe   Follow with serial echoes  Control BP  5  HL  will repeat lipids today    Check Lipomed panel, BMET and Hgb A1C       Follow up with me in the fall   Here for nurses visit in 1 month with BP cuff to confirm accuracy     Current medicines are reviewed at length with the patient today.  The patient does not have concerns regarding medicines.  Signed, Vina Gull, MD  02/04/2024 11:56 PM    [facility] [address] [place], [place], [place]  [address] Phone: [phone]; Fax: [phone]

## 2024-02-05 ENCOUNTER — Other Ambulatory Visit: Payer: Self-pay

## 2024-02-05 ENCOUNTER — Ambulatory Visit: Attending: Internal Medicine | Admitting: Internal Medicine

## 2024-02-05 ENCOUNTER — Other Ambulatory Visit (HOSPITAL_COMMUNITY): Payer: Self-pay

## 2024-02-05 ENCOUNTER — Encounter: Payer: Self-pay | Admitting: Internal Medicine

## 2024-02-05 VITALS — BP 152/50 | HR 56 | Ht 64.0 in | Wt 116.8 lb

## 2024-02-05 DIAGNOSIS — I10 Essential (primary) hypertension: Secondary | ICD-10-CM | POA: Diagnosis not present

## 2024-02-05 MED ORDER — SACUBITRIL-VALSARTAN 24-26 MG PO TABS
1.0000 | ORAL_TABLET | Freq: Two times a day (BID) | ORAL | 3 refills | Status: AC
  Filled 2024-02-05: qty 60, 30d supply, fill #0
  Filled 2024-02-05: qty 180, 90d supply, fill #0
  Filled 2024-03-07: qty 60, 30d supply, fill #1
  Filled 2024-04-09: qty 60, 30d supply, fill #2

## 2024-02-05 MED ORDER — ASPIRIN 81 MG PO TBEC
81.0000 mg | DELAYED_RELEASE_TABLET | Freq: Every day | ORAL | 1 refills | Status: AC
  Filled 2024-02-05: qty 90, 90d supply, fill #0

## 2024-02-05 NOTE — Patient Instructions (Signed)
 Medication Instructions:  Your physician has recommended you make the following change in your medication: 1) STOP taking losartan  2) START taking Entresto  24-26 mg twice daily *If you need a refill on your cardiac medications before your next appointment, please call your pharmacy*  Follow-Up: At [facility], you and your health needs are our priority.  As part of our continuing mission to provide you with exceptional heart care, our providers are all part of one team.  This team includes your primary Cardiologist (physician) and Advanced Practice Providers or APPs (Physician Assistants and Nurse Practitioners) who all work together to provide you with the care you need, when you need it.  Your next appointment:   4-5 weeks with Pharm D (hypertension clinic)   9 months with Dr. Okey

## 2024-02-12 ENCOUNTER — Encounter: Payer: Self-pay | Admitting: Family Medicine

## 2024-02-12 ENCOUNTER — Ambulatory Visit: Admitting: Family Medicine

## 2024-02-12 VITALS — BP 132/56 | HR 61 | Temp 97.6°F | Resp 18 | Ht 64.0 in | Wt 113.0 lb

## 2024-02-12 DIAGNOSIS — H6123 Impacted cerumen, bilateral: Secondary | ICD-10-CM | POA: Diagnosis not present

## 2024-02-12 NOTE — Progress Notes (Signed)
 Assessment & Plan Bilateral impacted cerumen Bilateral impacted cerumen with left ear more affected, causing fullness and hearing difficulty. Over-the-counter earwax removal was ineffective. - Performed ear irrigation to remove impacted cerumen. - Re-evaluated ear condition post-irrigation. - Education provided on earwax buildup     Follow up plan: Return if symptoms worsen or fail to improve.  Niki Rung, MSN, APRN, FNP-C  Subjective:  HPI: [name] is a 78 y.o. female presenting on 02/12/2024 for Cerumen Impaction (L>R/Started bothering her a couple of days ago - feels fullness /Tried OTC ear wax removal )  Discussed the use of AI scribe software for clinical note transcription with the patient, who gave verbal consent to proceed.  She has experienced a sensation of fullness in her ears for the past couple of days, with the left ear being more affected than the right. This has been accompanied by difficulty hearing.  She attempted to use over-the-counter earwax removal drops but was unable to determine their effectiveness. She has a history of having her ears flushed, particularly the right ear.  Her blood pressure was noted to be high during a previous visit, leading to a change in her medication a couple of weeks ago. She monitors her blood pressure at home and has been doing so since the medication adjustment.      ROS: Negative unless specifically indicated above in HPI.   Relevant past medical history reviewed and updated as indicated.   Allergies and medications reviewed and updated.   Current Outpatient Medications:    amLODipine  (NORVASC ) 10 MG tablet, Take 1 tablet (10 mg total) by mouth daily., Disp: 90 tablet, Rfl: 1   aspirin  EC (ASPIRIN  LOW DOSE) 81 MG tablet, Take 1 tablet (81 mg total) by mouth daily (swallow whole), Disp: 90 tablet, Rfl: 1   cholecalciferol  (VITAMIN D3) 25 MCG (1000 UNIT) tablet, Take 1,000 Units by mouth daily., Disp: , Rfl:     cyanocobalamin  2000 MCG tablet, Take 1 tablet (2,000 mcg total) by mouth daily., Disp: 90 tablet, Rfl: 0   empagliflozin  (JARDIANCE ) 10 MG TABS tablet, Take 1 tablet (10 mg total) by mouth daily before breakfast., Disp: 90 tablet, Rfl: 1   nitroGLYCERIN  (NITROSTAT ) 0.4 MG SL tablet, Place 1 tablet under the tongue every 5 minutes as needed for chest pain (3 DOSES then CALL 911)., Disp: 25 tablet, Rfl: 11   rosuvastatin  (CRESTOR ) 40 MG tablet, Take 1 tablet (40 mg total) by mouth daily., Disp: 90 tablet, Rfl: 3   sacubitril -valsartan  (ENTRESTO ) 24-26 MG, Take 1 tablet by mouth 2 (two) times daily., Disp: 180 tablet, Rfl: 3   zinc  gluconate 50 MG tablet, Take 1 tablet (50 mg total) by mouth daily., Disp: 100 tablet, Rfl: 0  No Known Allergies  Objective:   BP (!) 132/56   Pulse 61   Temp 97.6 F (36.4 C)   Resp 18   Ht 5' 4 (1.626 m)   Wt 113 lb (51.3 kg)   SpO2 99%   BMI 19.40 kg/m    Physical Exam Vitals reviewed.  Constitutional:      General: She is not in acute distress.    Appearance: Normal appearance. She is not ill-appearing, toxic-appearing or diaphoretic.  HENT:     Head: Normocephalic and atraumatic.     Right Ear: Tympanic membrane, ear canal and external ear normal. There is impacted cerumen.     Left Ear: Tympanic membrane, ear canal and external ear normal. There is impacted cerumen.  Eyes:  General: No scleral icterus.       Right eye: No discharge.        Left eye: No discharge.     Conjunctiva/sclera: Conjunctivae normal.  Cardiovascular:     Rate and Rhythm: Normal rate.  Pulmonary:     Effort: Pulmonary effort is normal. No respiratory distress.  Musculoskeletal:        General: Normal range of motion.     Cervical back: Normal range of motion.  Skin:    General: Skin is warm and dry.     Capillary Refill: Capillary refill takes less than 2 seconds.  Neurological:     General: No focal deficit present.     Mental Status: She is alert and oriented  to person, place, and time. Mental status is at baseline.  Psychiatric:        Mood and Affect: Mood normal.        Behavior: Behavior normal.        Thought Content: Thought content normal.        Judgment: Judgment normal.   Ear Cerumen Removal  Date/Time: 02/12/2024 11:28 AM  Performed by: Merlynn Niki FALCON, FNP Authorized by: Merlynn Niki FALCON, FNP   Anesthesia: Local Anesthetic: none Location details: right ear and left ear Patient tolerance: patient tolerated the procedure well with no immediate complications Procedure type: irrigation  Sedation: Patient sedated: no

## 2024-02-23 ENCOUNTER — Encounter: Payer: Self-pay | Admitting: Pharmacist

## 2024-02-23 NOTE — Progress Notes (Signed)
 Pharmacy Quality Measure Review  This patient is appearing on a report for being at risk of failing the adherence measure for cholesterol (statin) medications this calendar year.   Medication: Rosuvastatin  Last fill date: 02/23/24 for 90 day supply  Insurance report was not up to date. No action needed at this time.   Darrelyn Drum, PharmD, BCPS, CPP Clinical Pharmacist Practitioner [facility] [phone]

## 2024-03-04 ENCOUNTER — Ambulatory Visit: Attending: Cardiology | Admitting: *Deleted

## 2024-03-04 VITALS — BP 113/64 | HR 74 | Ht 64.0 in | Wt 116.0 lb

## 2024-03-04 DIAGNOSIS — I10 Essential (primary) hypertension: Secondary | ICD-10-CM

## 2024-03-04 NOTE — Progress Notes (Unsigned)
" ° °  Nurse Visit   Date of Encounter: 03/04/2024 ID: [name], DOB 28-May-1945, MRN [id number]  PCP:  Joshua Debby CROME, MD   [facility] Providers Cardiologist:  Vina Gull, MD      Visit Details   VS:  BP 113/64 (BP Location: Left Arm, Patient Position: Sitting, Cuff Size: Normal)   Pulse 74   Ht 5' 4 (1.626 m)   Wt 116 lb (52.6 kg)   SpO2 100%   BMI 19.91 kg/m  , BMI Body mass index is 19.91 kg/m.  Wt Readings from Last 3 Encounters:  03/04/24 116 lb (52.6 kg)  02/12/24 113 lb (51.3 kg)  02/05/24 116 lb 12.8 oz (53 kg)     Reason for visit: BP check Performed today: VS with our monitor and pt's home monitor, education Changes (medications, testing, etc.) : none Length of Visit: 10 minutes  Pt arrives here today as scheduled to follow up for a BP check after starting Entresto  24-26 mg one tablet BID.  Denies any concerns or complaints.  Taking medication as prescribed.  Aware this information will be sent to Dr Gull for her knowledge.    Medications Adjustments/Labs and Tests Ordered: No orders of the defined types were placed in this encounter.  No orders of the defined types were placed in this encounter.    Signed, Sharlet Servant, RN  03/04/2024 11:27 AM  "

## 2024-03-04 NOTE — Patient Instructions (Signed)
 Medication Instructions:  The current medical regimen is effective;  continue present plan and medications.  *If you need a refill on your cardiac medications before your next appointment, please call your pharmacy*   Follow-Up: At [facility], you and your health needs are our priority.  As part of our continuing mission to provide you with exceptional heart care, our providers are all part of one team.  This team includes your primary Cardiologist (physician) and Advanced Practice Providers or APPs (Physician Assistants and Nurse Practitioners) who all work together to provide you with the care you need, when you need it.  Your next appointment:   As instructed   We recommend signing up for the patient portal called MyChart.  Sign up information is provided on this After Visit Summary.  MyChart is used to connect with patients for Virtual Visits (Telemedicine).  Patients are able to view lab/test results, encounter notes, upcoming appointments, etc.  Non-urgent messages can be sent to your provider as well.   To learn more about what you can do with MyChart, go to [email or web address].

## 2024-03-22 ENCOUNTER — Other Ambulatory Visit: Payer: Self-pay | Admitting: Internal Medicine

## 2024-03-22 ENCOUNTER — Other Ambulatory Visit (HOSPITAL_COMMUNITY): Payer: Self-pay

## 2024-03-22 DIAGNOSIS — N1832 Chronic kidney disease, stage 3b: Secondary | ICD-10-CM

## 2024-03-22 MED ORDER — EMPAGLIFLOZIN 10 MG PO TABS
10.0000 mg | ORAL_TABLET | Freq: Every day | ORAL | 1 refills | Status: AC
  Filled 2024-03-22: qty 90, 90d supply, fill #0
  Filled 2024-03-26: qty 30, 30d supply, fill #0

## 2024-03-23 ENCOUNTER — Other Ambulatory Visit (HOSPITAL_COMMUNITY): Payer: Self-pay

## 2024-03-25 ENCOUNTER — Other Ambulatory Visit (HOSPITAL_COMMUNITY): Payer: Self-pay

## 2024-03-26 ENCOUNTER — Other Ambulatory Visit (HOSPITAL_COMMUNITY): Payer: Self-pay

## 2024-04-10 ENCOUNTER — Other Ambulatory Visit (HOSPITAL_COMMUNITY): Payer: Self-pay

## 2024-04-12 ENCOUNTER — Ambulatory Visit

## 2024-04-12 VITALS — BP 120/62 | HR 62 | Ht 64.0 in | Wt 114.0 lb

## 2024-04-12 DIAGNOSIS — Z0000 Encounter for general adult medical examination without abnormal findings: Secondary | ICD-10-CM | POA: Diagnosis not present

## 2024-04-12 NOTE — Patient Instructions (Addendum)
 [name],  Thank you for taking the time for your Medicare Wellness Visit. I appreciate your continued commitment to your health goals. Please review the care plan we discussed, and feel free to reach out if I can assist you further.  Please note that Annual Wellness Visits do not include a physical exam. Some assessments may be limited, especially if the visit was conducted virtually. If needed, we may recommend an in-person follow-up with your provider.  Ongoing Care Seeing your primary care provider every 3 to 6 months helps us  monitor your health and provide consistent, personalized care. Last office visit on 02/12/2024.  Keep up the work.  Referrals If a referral was made during today's visit and you haven't received any updates within two weeks, please contact the referred provider directly to check on the status.  Recommended Screenings:  Health Maintenance  Topic Date Due   Medicare Annual Wellness Visit  Never done   Zoster (Shingles) Vaccine (2 of 2) 12/16/2021   COVID-19 Vaccine (4 - 2025-26 season) 11/13/2023   DTaP/Tdap/Td vaccine (4 - Td or Tdap) 04/25/2032   Pneumococcal Vaccine for age over 19  Completed   Flu Shot  Completed   Osteoporosis screening with Bone Density Scan  Completed   Hepatitis C Screening  Completed   Meningitis B Vaccine  Aged Out   Breast Cancer Screening  Discontinued   Colon Cancer Screening  Discontinued       04/12/2024    3:33 PM  Advanced Directives  Does Patient Have a Medical Advance Directive? No    Vision: Annual vision screenings are recommended for early detection of glaucoma, cataracts, and diabetic retinopathy. These exams can also reveal signs of chronic conditions such as diabetes and high blood pressure.  Dental: Annual dental screenings help detect early signs of oral cancer, gum disease, and other conditions linked to overall health, including heart disease and diabetes.  Please see the attached documents for additional  preventive care recommendations.

## 2024-04-12 NOTE — Progress Notes (Signed)
 "  Chief Complaint  Patient presents with   Medicare Wellness     Subjective:   [name] is a 79 y.o. female who presents for a Medicare Annual Wellness Visit.  Visit info / Clinical Intake: Medicare Wellness Visit Type:: Subsequent Annual Wellness Visit Persons participating in visit and providing information:: patient Medicare Wellness Visit Mode:: In-person (required for WTM) Interpreter Needed?: No Pre-visit prep was completed: yes AWV questionnaire completed by patient prior to visit?: no Living arrangements:: lives with spouse/significant other Patient's Overall Health Status Rating: good Typical amount of pain: none Does pain affect daily life?: no Are you currently prescribed opioids?: no  Dietary Habits and Nutritional Risks How many meals a day?: 3 Eats fruit and vegetables daily?: (!) no Most meals are obtained by: preparing own meals In the last 2 weeks, have you had any of the following?: none Diabetic:: no  Functional Status Activities of Daily Living (to include ambulation/medication): Independent Ambulation: Independent with device- listed below Home Assistive Devices/Equipment: Eyeglasses Medication Administration: Independent Home Management (perform basic housework or laundry): Independent Manage your own finances?: yes Primary transportation is: driving Concerns about vision?: no *vision screening is required for WTM* Concerns about hearing?: no  Fall Screening Falls in the past year?: 0 Number of falls in past year: 0 Was there an injury with Fall?: 0 Fall Risk Category Calculator: 0 Patient Fall Risk Level: Low Fall Risk  Fall Risk Patient at Risk for Falls Due to: No Fall Risks Fall risk Follow up: Falls evaluation completed; Falls prevention discussed  Home and Transportation Safety: All rugs have non-skid backing?: N/A, no rugs All stairs or steps have railings?: yes (outside steps) Grab bars in the bathtub or shower?: (!) no Have  non-skid surface in bathtub or shower?: (!) no Good home lighting?: yes Regular seat belt use?: yes Hospital stays in the last year:: no  Cognitive Assessment Difficulty concentrating, remembering, or making decisions? : no Will 6CIT or Mini Cog be Completed: no 6CIT or Mini Cog Declined: patient alert, oriented, able to answer questions appropriately and recall recent events  Advance Directives (For Healthcare) Does Patient Have a Medical Advance Directive?: No  Reviewed/Updated  Reviewed/Updated: Reviewed All (Medical, Surgical, Family, Medications, Allergies, Care Teams, Patient Goals)    Allergies (verified) Patient has no known allergies.   Current Medications (verified) Outpatient Encounter Medications as of 04/12/2024  Medication Sig   aspirin  EC (ASPIRIN  LOW DOSE) 81 MG tablet Take 1 tablet (81 mg total) by mouth daily (swallow whole)   cholecalciferol  (VITAMIN D3) 25 MCG (1000 UNIT) tablet Take 1,000 Units by mouth daily.   empagliflozin  (JARDIANCE ) 10 MG TABS tablet Take 1 tablet (10 mg total) by mouth daily before breakfast.   nitroGLYCERIN  (NITROSTAT ) 0.4 MG SL tablet Place 1 tablet under the tongue every 5 minutes as needed for chest pain (3 DOSES then CALL 911).   rosuvastatin  (CRESTOR ) 40 MG tablet Take 1 tablet (40 mg total) by mouth daily.   sacubitril -valsartan  (ENTRESTO ) 24-26 MG Take 1 tablet by mouth 2 (two) times daily.   zinc  gluconate 50 MG tablet Take 1 tablet (50 mg total) by mouth daily.   amLODipine  (NORVASC ) 10 MG tablet Take 1 tablet (10 mg total) by mouth daily. (Patient not taking: Reported on 04/12/2024)   cyanocobalamin  2000 MCG tablet Take 1 tablet (2,000 mcg total) by mouth daily. (Patient not taking: Reported on 04/12/2024)   No facility-administered encounter medications on file as of 04/12/2024.    History: Past  Medical History:  Diagnosis Date   CAD (coronary artery disease)    Cervical cancer (HCC)    Dr Austin   Hyperlipidemia     Hypertension    Tobacco abuse    Past Surgical History:  Procedure Laterality Date   CORONARY ARTERY BYPASS GRAFT     Family History  Problem Relation Age of Onset   Hypertension Mother    Heart attack Father    Diabetes Other    Hypertension Other    Coronary artery disease Other    Cancer Neg Hx    Stroke Neg Hx    Hyperlipidemia Neg Hx    Social History   Occupational History   Occupation: Systems Developer at [employer]    Employer: [employer]   Occupation: RETIRED  Tobacco Use   Smoking status: Former    Current packs/day: 0.00    Types: Cigarettes    Quit date: 03/14/1985    Years since quitting: 39.1   Smokeless tobacco: Never  Vaping Use   Vaping status: Never Used  Substance and Sexual Activity   Alcohol use: No   Drug use: No   Sexual activity: Not Currently   Tobacco Counseling Counseling given: Not Answered  SDOH Screenings   Food Insecurity: No Food Insecurity (04/12/2024)  Housing: Unknown (04/12/2024)  Transportation Needs: No Transportation Needs (04/12/2024)  Utilities: Not At Risk (04/12/2024)  Depression (PHQ2-9): Low Risk (04/12/2024)  Physical Activity: Insufficiently Active (04/12/2024)  Social Connections: Socially Integrated (04/12/2024)  Stress: No Stress Concern Present (04/12/2024)  Tobacco Use: Medium Risk (04/12/2024)  Health Literacy: Adequate Health Literacy (04/12/2024)   See flowsheets for full screening details  Depression Screen PHQ 2 & 9 Depression Scale- Over the past 2 weeks, how often have you been bothered by any of the following problems? Little interest or pleasure in doing things: 0 Feeling down, depressed, or hopeless (PHQ Adolescent also includes...irritable): 0 PHQ-2 Total Score: 0 Trouble falling or staying asleep, or sleeping too much: 0 Feeling tired or having little energy: 0 Poor appetite or overeating (PHQ Adolescent also includes...weight loss): 0 Feeling bad about yourself - or that you are a failure or have let yourself or  your family down: 0 Trouble concentrating on things, such as reading the newspaper or watching television (PHQ Adolescent also includes...like school work): 0 Moving or speaking so slowly that other people could have noticed. Or the opposite - being so fidgety or restless that you have been moving around a lot more than usual: 0 Thoughts that you would be better off dead, or of hurting yourself in some way: 0 PHQ-9 Total Score: 0 If you checked off any problems, how difficult have these problems made it for you to do your work, take care of things at home, or get along with other people?: Not difficult at all  Depression Treatment Depression Interventions/Treatment : EYV7-0 Score <4 Follow-up Not Indicated     Goals Addressed   None          Objective:    Today's Vitals   04/12/24 1550  BP: 120/62  Pulse: 62  SpO2: 99%  Weight: 114 lb (51.7 kg)  Height: 5' 4 (1.626 m)   Body mass index is 19.57 kg/m.  Hearing/Vision screen Hearing Screening - Comments:: Denies hearing difficulties   Vision Screening - Comments:: Wears eyeglasses/UTD/Dr. Carolee Immunizations and Health Maintenance Health Maintenance  Topic Date Due   Zoster Vaccines- Shingrix  (2 of 2) 12/16/2021   COVID-19 Vaccine (4 - 2025-26 season) 11/13/2023  Medicare Annual Wellness (AWV)  04/12/2025   DTaP/Tdap/Td (4 - Td or Tdap) 04/25/2032   Pneumococcal Vaccine: 50+ Years  Completed   Influenza Vaccine  Completed   Bone Density Scan  Completed   Hepatitis C Screening  Completed   Meningococcal B Vaccine  Aged Out   Mammogram  Discontinued   Colonoscopy  Discontinued        Assessment/Plan:  This is a routine wellness examination for [facility].  Patient Care Team: Joshua Debby CROME, MD as PCP - General Okey Vina GAILS, MD as PCP - Cardiology (Cardiology)  I have personally reviewed and noted the following in the patients chart:   Medical and social history Use of alcohol, tobacco or illicit drugs   Current medications and supplements including opioid prescriptions. Functional ability and status Nutritional status Physical activity Advanced directives List of other physicians Hospitalizations, surgeries, and ER visits in previous 12 months Vitals Screenings to include cognitive, depression, and falls Referrals and appointments  No orders of the defined types were placed in this encounter.  In addition, I have reviewed and discussed with patient certain preventive protocols, quality metrics, and best practice recommendations. A written personalized care plan for preventive services as well as general preventive health recommendations were provided to patient.   Aleathia Purdy L Kelie Gainey, CMA   04/12/2024   Return in 1 year (on 04/12/2025).  After Visit Summary: (MyChart) Due to this being a telephonic visit, the after visit summary with patients personalized plan was offered to patient via MyChart   Nurse Notes: No voiced or noted concerns at this time. "

## 2024-08-19 ENCOUNTER — Ambulatory Visit: Admitting: Internal Medicine

## 2025-04-14 ENCOUNTER — Ambulatory Visit
